# Patient Record
Sex: Female | Born: 1965 | Race: White | Hispanic: No | Marital: Married | Smoking: Never smoker
Health system: Southern US, Community
[De-identification: ages and names within clinical notes are randomized; demographics above are authoritative.]

## PROBLEM LIST (undated history)

## (undated) DIAGNOSIS — K759 Inflammatory liver disease, unspecified: Secondary | ICD-10-CM

## (undated) DIAGNOSIS — R7989 Other specified abnormal findings of blood chemistry: Secondary | ICD-10-CM

## (undated) DIAGNOSIS — R001 Bradycardia, unspecified: Secondary | ICD-10-CM

## (undated) DIAGNOSIS — I77810 Thoracic aortic ectasia: Secondary | ICD-10-CM

## (undated) DIAGNOSIS — R945 Abnormal results of liver function studies: Secondary | ICD-10-CM

## (undated) DIAGNOSIS — I77819 Aortic ectasia, unspecified site: Secondary | ICD-10-CM

## (undated) DIAGNOSIS — Z8249 Family history of ischemic heart disease and other diseases of the circulatory system: Secondary | ICD-10-CM

## (undated) DIAGNOSIS — E079 Disorder of thyroid, unspecified: Secondary | ICD-10-CM

## (undated) DIAGNOSIS — D649 Anemia, unspecified: Secondary | ICD-10-CM

## (undated) DIAGNOSIS — R51 Headache: Secondary | ICD-10-CM

## (undated) DIAGNOSIS — I341 Nonrheumatic mitral (valve) prolapse: Secondary | ICD-10-CM

## (undated) DIAGNOSIS — Z87442 Personal history of urinary calculi: Secondary | ICD-10-CM

## (undated) HISTORY — PX: LIVER BIOPSY: SHX301

## (undated) HISTORY — PX: DILATION AND CURETTAGE OF UTERUS: SHX78

## (undated) HISTORY — DX: Aortic ectasia, unspecified site: I77.819

## (undated) HISTORY — DX: Family history of ischemic heart disease and other diseases of the circulatory system: Z82.49

## (undated) HISTORY — DX: Disorder of thyroid, unspecified: E07.9

## (undated) HISTORY — DX: Nonrheumatic mitral (valve) prolapse: I34.1

## (undated) HISTORY — DX: Bradycardia, unspecified: R00.1

## (undated) HISTORY — DX: Thoracic aortic ectasia: I77.810

## (undated) HISTORY — PX: UTERINE FIBROID SURGERY: SHX826

---

## 1999-02-24 ENCOUNTER — Other Ambulatory Visit: Admission: RE | Admit: 1999-02-24 | Discharge: 1999-02-24 | Payer: Self-pay | Admitting: Obstetrics and Gynecology

## 2000-02-29 ENCOUNTER — Other Ambulatory Visit: Admission: RE | Admit: 2000-02-29 | Discharge: 2000-02-29 | Payer: Self-pay | Admitting: Obstetrics and Gynecology

## 2000-08-12 ENCOUNTER — Encounter: Payer: Self-pay | Admitting: Obstetrics and Gynecology

## 2000-08-12 ENCOUNTER — Inpatient Hospital Stay (HOSPITAL_COMMUNITY): Admission: AD | Admit: 2000-08-12 | Discharge: 2000-08-12 | Payer: Self-pay | Admitting: Obstetrics and Gynecology

## 2001-09-11 ENCOUNTER — Other Ambulatory Visit: Admission: RE | Admit: 2001-09-11 | Discharge: 2001-09-11 | Payer: Self-pay | Admitting: Obstetrics and Gynecology

## 2002-01-15 ENCOUNTER — Ambulatory Visit (HOSPITAL_COMMUNITY): Admission: RE | Admit: 2002-01-15 | Discharge: 2002-01-15 | Payer: Self-pay | Admitting: Obstetrics and Gynecology

## 2002-01-15 ENCOUNTER — Encounter: Payer: Self-pay | Admitting: Obstetrics and Gynecology

## 2002-02-27 ENCOUNTER — Inpatient Hospital Stay (HOSPITAL_COMMUNITY): Admission: AD | Admit: 2002-02-27 | Discharge: 2002-02-27 | Payer: Self-pay | Admitting: Obstetrics and Gynecology

## 2002-03-27 ENCOUNTER — Inpatient Hospital Stay (HOSPITAL_COMMUNITY): Admission: AD | Admit: 2002-03-27 | Discharge: 2002-03-28 | Payer: Self-pay | Admitting: Obstetrics and Gynecology

## 2002-05-14 ENCOUNTER — Other Ambulatory Visit: Admission: RE | Admit: 2002-05-14 | Discharge: 2002-05-14 | Payer: Self-pay | Admitting: Obstetrics and Gynecology

## 2003-06-24 ENCOUNTER — Other Ambulatory Visit: Admission: RE | Admit: 2003-06-24 | Discharge: 2003-06-24 | Payer: Self-pay | Admitting: Obstetrics and Gynecology

## 2004-02-10 ENCOUNTER — Other Ambulatory Visit: Admission: RE | Admit: 2004-02-10 | Discharge: 2004-02-10 | Payer: Self-pay | Admitting: Obstetrics and Gynecology

## 2004-08-03 ENCOUNTER — Inpatient Hospital Stay (HOSPITAL_COMMUNITY): Admission: AD | Admit: 2004-08-03 | Discharge: 2004-08-05 | Payer: Self-pay | Admitting: Obstetrics and Gynecology

## 2004-08-06 ENCOUNTER — Encounter: Admission: RE | Admit: 2004-08-06 | Discharge: 2004-09-05 | Payer: Self-pay | Admitting: Obstetrics and Gynecology

## 2004-09-27 ENCOUNTER — Other Ambulatory Visit: Admission: RE | Admit: 2004-09-27 | Discharge: 2004-09-27 | Payer: Self-pay | Admitting: Obstetrics and Gynecology

## 2004-10-19 ENCOUNTER — Encounter: Admission: RE | Admit: 2004-10-19 | Discharge: 2004-10-19 | Payer: Self-pay | Admitting: Gastroenterology

## 2007-06-06 ENCOUNTER — Encounter: Admission: RE | Admit: 2007-06-06 | Discharge: 2007-06-06 | Payer: Self-pay | Admitting: Obstetrics and Gynecology

## 2010-02-25 ENCOUNTER — Other Ambulatory Visit: Payer: Self-pay | Admitting: Obstetrics and Gynecology

## 2010-02-25 DIAGNOSIS — Z1231 Encounter for screening mammogram for malignant neoplasm of breast: Secondary | ICD-10-CM

## 2010-03-07 ENCOUNTER — Ambulatory Visit
Admission: RE | Admit: 2010-03-07 | Discharge: 2010-03-07 | Disposition: A | Discharge status: Home / Self Care | Payer: Self-pay | Source: Ambulatory Visit | Attending: Obstetrics and Gynecology | Admitting: Obstetrics and Gynecology

## 2010-03-07 DIAGNOSIS — Z1231 Encounter for screening mammogram for malignant neoplasm of breast: Secondary | ICD-10-CM

## 2010-05-05 ENCOUNTER — Ambulatory Visit (INDEPENDENT_AMBULATORY_CARE_PROVIDER_SITE_OTHER): Payer: BLUE CROSS/BLUE SHIELD

## 2010-05-05 DIAGNOSIS — I83893 Varicose veins of bilateral lower extremities with other complications: Secondary | ICD-10-CM

## 2012-08-06 ENCOUNTER — Other Ambulatory Visit: Payer: Self-pay | Admitting: Gastroenterology

## 2012-08-06 DIAGNOSIS — R945 Abnormal results of liver function studies: Secondary | ICD-10-CM

## 2012-08-06 DIAGNOSIS — R7989 Other specified abnormal findings of blood chemistry: Secondary | ICD-10-CM

## 2012-08-12 ENCOUNTER — Ambulatory Visit
Admission: RE | Admit: 2012-08-12 | Discharge: 2012-08-12 | Disposition: A | Discharge status: Home / Self Care | Payer: No Typology Code available for payment source | Source: Ambulatory Visit | Attending: Gastroenterology | Admitting: Gastroenterology

## 2012-08-12 DIAGNOSIS — R7989 Other specified abnormal findings of blood chemistry: Secondary | ICD-10-CM

## 2012-08-12 DIAGNOSIS — R945 Abnormal results of liver function studies: Secondary | ICD-10-CM

## 2012-09-09 ENCOUNTER — Encounter (HOSPITAL_COMMUNITY): Payer: Self-pay

## 2012-09-09 NOTE — H&P (Signed)
47 year old G 4 P 4 with menorrhagia. Ultrasound in office showed 3 cm submucosal fibroid.  Medical History PCOS Workup with GI ongoing for ?cirrhosis  Surgical History  None  NKDA  Afebrile VSS General alert and oriented Lung CTAB Car RRR Abdomen is soft and non tender Pelvic is WNL except for above  IMPRESSION Menorrhagia Submucosal Fibroid  PLAN: D and C Hysteroscopy Resection of Submucosal Fibroid Thermachoice Endometrial ablation  Risks reviewed Consent signed

## 2012-09-13 ENCOUNTER — Ambulatory Visit (HOSPITAL_COMMUNITY)
Admission: RE | Admit: 2012-09-13 | Discharge: 2012-09-13 | Disposition: A | Discharge status: Home / Self Care | Payer: No Typology Code available for payment source | Source: Ambulatory Visit | Attending: Obstetrics and Gynecology | Admitting: Obstetrics and Gynecology

## 2012-09-13 ENCOUNTER — Encounter (HOSPITAL_COMMUNITY)
Admission: RE | Disposition: A | Discharge status: Home / Self Care | Payer: Self-pay | Source: Ambulatory Visit | Attending: Obstetrics and Gynecology

## 2012-09-13 ENCOUNTER — Ambulatory Visit (HOSPITAL_COMMUNITY): Payer: No Typology Code available for payment source | Admitting: Anesthesiology

## 2012-09-13 ENCOUNTER — Encounter (HOSPITAL_COMMUNITY): Payer: Self-pay | Admitting: Anesthesiology

## 2012-09-13 DIAGNOSIS — D250 Submucous leiomyoma of uterus: Secondary | ICD-10-CM | POA: Insufficient documentation

## 2012-09-13 DIAGNOSIS — D219 Benign neoplasm of connective and other soft tissue, unspecified: Secondary | ICD-10-CM

## 2012-09-13 DIAGNOSIS — N920 Excessive and frequent menstruation with regular cycle: Secondary | ICD-10-CM | POA: Insufficient documentation

## 2012-09-13 HISTORY — DX: Anemia, unspecified: D64.9

## 2012-09-13 HISTORY — DX: Abnormal results of liver function studies: R94.5

## 2012-09-13 HISTORY — DX: Headache: R51

## 2012-09-13 HISTORY — PX: DILATATION & CURETTAGE/HYSTEROSCOPY WITH TRUECLEAR: SHX6353

## 2012-09-13 HISTORY — PX: DILATATION & CURRETTAGE/HYSTEROSCOPY WITH RESECTOCOPE: SHX5572

## 2012-09-13 HISTORY — DX: Other specified abnormal findings of blood chemistry: R79.89

## 2012-09-13 LAB — CBC
MCH: 28.6 pg (ref 26.0–34.0)
MCHC: 33.1 g/dL (ref 30.0–36.0)
MCV: 86.4 fL (ref 78.0–100.0)
Platelets: 196 10*3/uL (ref 150–400)
RBC: 3.6 MIL/uL — ABNORMAL LOW (ref 3.87–5.11)
RDW: 12.9 % (ref 11.5–15.5)

## 2012-09-13 SURGERY — DILATATION & CURETTAGE/HYSTEROSCOPY WITH TRUCLEAR
Anesthesia: General | Site: Vagina | Wound class: Clean Contaminated

## 2012-09-13 MED ORDER — MIDAZOLAM HCL 5 MG/5ML IJ SOLN
INTRAMUSCULAR | Status: DC | PRN
  Administered 2012-09-13: 2 mg via INTRAVENOUS

## 2012-09-13 MED ORDER — LIDOCAINE HCL (CARDIAC) 20 MG/ML IV SOLN
INTRAVENOUS | Status: AC
  Filled 2012-09-13: qty 5

## 2012-09-13 MED ORDER — FENTANYL CITRATE 0.05 MG/ML IJ SOLN
INTRAMUSCULAR | Status: AC
  Administered 2012-09-13: 25 ug via INTRAVENOUS
  Filled 2012-09-13: qty 2

## 2012-09-13 MED ORDER — ATROPINE SULFATE 0.4 MG/ML IJ SOLN
INTRAMUSCULAR | Status: AC
  Filled 2012-09-13: qty 1

## 2012-09-13 MED ORDER — LIDOCAINE HCL 1 % IJ SOLN
INTRAMUSCULAR | Status: DC | PRN
  Administered 2012-09-13: 10 mL

## 2012-09-13 MED ORDER — CEFAZOLIN SODIUM-DEXTROSE 2-3 GM-% IV SOLR
2.0000 g | INTRAVENOUS | Status: AC
  Administered 2012-09-13: 2 g via INTRAVENOUS

## 2012-09-13 MED ORDER — FENTANYL CITRATE 0.05 MG/ML IJ SOLN
INTRAMUSCULAR | Status: DC | PRN
  Administered 2012-09-13 (×4): 50 ug via INTRAVENOUS

## 2012-09-13 MED ORDER — LACTATED RINGERS IV SOLN: INTRAVENOUS | Status: DC

## 2012-09-13 MED ORDER — VASOPRESSIN 20 UNIT/ML IJ SOLN
INTRAMUSCULAR | Status: AC
  Filled 2012-09-13: qty 1

## 2012-09-13 MED ORDER — MIDAZOLAM HCL 2 MG/2ML IJ SOLN: 0.5000 mg | Freq: Once | INTRAMUSCULAR | Status: DC | PRN

## 2012-09-13 MED ORDER — CEFAZOLIN SODIUM-DEXTROSE 2-3 GM-% IV SOLR
INTRAVENOUS | Status: AC
  Filled 2012-09-13: qty 50

## 2012-09-13 MED ORDER — IBUPROFEN 200 MG PO TABS: 600.0000 mg | ORAL_TABLET | Freq: Four times a day (QID) | ORAL | Status: DC | PRN

## 2012-09-13 MED ORDER — FENTANYL CITRATE 0.05 MG/ML IJ SOLN: 25.0000 ug | INTRAMUSCULAR | Status: DC | PRN

## 2012-09-13 MED ORDER — FENTANYL CITRATE 0.05 MG/ML IJ SOLN
INTRAMUSCULAR | Status: AC
  Filled 2012-09-13: qty 2

## 2012-09-13 MED ORDER — VASOPRESSIN 20 UNIT/ML IJ SOLN
INTRAVENOUS | Status: DC | PRN
  Administered 2012-09-13: 14:00:00 via INTRAMUSCULAR

## 2012-09-13 MED ORDER — PROPOFOL 10 MG/ML IV EMUL
INTRAVENOUS | Status: AC
  Filled 2012-09-13: qty 20

## 2012-09-13 MED ORDER — ATROPINE SULFATE 0.4 MG/ML IJ SOLN
INTRAMUSCULAR | Status: DC | PRN
  Administered 2012-09-13: 0.2 mg via INTRAVENOUS

## 2012-09-13 MED ORDER — DEXAMETHASONE SODIUM PHOSPHATE 10 MG/ML IJ SOLN
INTRAMUSCULAR | Status: AC
  Filled 2012-09-13: qty 1

## 2012-09-13 MED ORDER — LACTATED RINGERS IV SOLN
INTRAVENOUS | Status: DC
  Administered 2012-09-13 (×2): via INTRAVENOUS

## 2012-09-13 MED ORDER — LIDOCAINE HCL (CARDIAC) 20 MG/ML IV SOLN
INTRAVENOUS | Status: DC | PRN
  Administered 2012-09-13: 60 mg via INTRAVENOUS

## 2012-09-13 MED ORDER — PROPOFOL 10 MG/ML IV BOLUS
INTRAVENOUS | Status: DC | PRN
  Administered 2012-09-13: 150 mg via INTRAVENOUS
  Administered 2012-09-13: 20 mg via INTRAVENOUS

## 2012-09-13 MED ORDER — ONDANSETRON HCL 4 MG/2ML IJ SOLN
INTRAMUSCULAR | Status: DC | PRN
  Administered 2012-09-13: 4 mg via INTRAVENOUS

## 2012-09-13 MED ORDER — DEXTROSE 5 % IV SOLN
INTRAVENOUS | Status: DC | PRN
  Administered 2012-09-13: 1000 mL

## 2012-09-13 MED ORDER — OXYCODONE-ACETAMINOPHEN 10-325 MG PO TABS: 1.0000 | ORAL_TABLET | ORAL | Status: DC | PRN

## 2012-09-13 MED ORDER — GLYCINE 1.5 % IR SOLN
Status: DC | PRN
  Administered 2012-09-13 (×2): 3000 mL

## 2012-09-13 MED ORDER — KETOROLAC TROMETHAMINE 30 MG/ML IJ SOLN: 15.0000 mg | Freq: Once | INTRAMUSCULAR | Status: DC | PRN

## 2012-09-13 MED ORDER — PROMETHAZINE HCL 25 MG/ML IJ SOLN: 6.2500 mg | INTRAMUSCULAR | Status: DC | PRN

## 2012-09-13 MED ORDER — SODIUM CHLORIDE 0.9 % IR SOLN
Status: DC | PRN
  Administered 2012-09-13 (×4): 3000 mL

## 2012-09-13 MED ORDER — DEXAMETHASONE SODIUM PHOSPHATE 10 MG/ML IJ SOLN
INTRAMUSCULAR | Status: DC | PRN
  Administered 2012-09-13: 10 mg via INTRAVENOUS

## 2012-09-13 MED ORDER — KETOROLAC TROMETHAMINE 30 MG/ML IJ SOLN
INTRAMUSCULAR | Status: DC | PRN
  Administered 2012-09-13: 30 mg via INTRAVENOUS

## 2012-09-13 MED ORDER — MEPERIDINE HCL 25 MG/ML IJ SOLN: 6.2500 mg | INTRAMUSCULAR | Status: DC | PRN

## 2012-09-13 MED ORDER — MIDAZOLAM HCL 2 MG/2ML IJ SOLN
INTRAMUSCULAR | Status: AC
  Filled 2012-09-13: qty 2

## 2012-09-13 MED ORDER — ONDANSETRON HCL 4 MG/2ML IJ SOLN
INTRAMUSCULAR | Status: AC
  Filled 2012-09-13: qty 2

## 2012-09-13 MED ORDER — KETOROLAC TROMETHAMINE 30 MG/ML IJ SOLN
INTRAMUSCULAR | Status: AC
  Filled 2012-09-13: qty 1

## 2012-09-13 SURGICAL SUPPLY — 17 items
CANISTER SUCTION 2500CC (MISCELLANEOUS) ×3 IMPLANT
CANISTERS HI-FLOW 3000CC (CANNISTER) ×4 IMPLANT
CATH ROBINSON RED A/P 16FR (CATHETERS) ×2 IMPLANT
CATH THERMACHOICE III (CATHETERS) ×3 IMPLANT
CLOTH BEACON ORANGE TIMEOUT ST (SAFETY) ×2 IMPLANT
CONTAINER PREFILL 10% NBF 60ML (FORM) ×3 IMPLANT
DRESSING TELFA 8X3 (GAUZE/BANDAGES/DRESSINGS) ×2 IMPLANT
GLOVE BIO SURGEON STRL SZ 6.5 (GLOVE) ×4 IMPLANT
GOWN STRL REIN XL XLG (GOWN DISPOSABLE) ×4 IMPLANT
KIT HYSTEROSCOPY TRUCLEAR (ABLATOR) ×1 IMPLANT
MORCELLATOR RECIP TRUCLEAR 4.0 (ABLATOR) ×2 IMPLANT
PACK HYSTEROSCOPY LF (CUSTOM PROCEDURE TRAY) ×1 IMPLANT
PACK VAGINAL MINOR WOMEN LF (CUSTOM PROCEDURE TRAY) ×1 IMPLANT
PAD OB MATERNITY 4.3X12.25 (PERSONAL CARE ITEMS) ×2 IMPLANT
TOWEL OR 17X24 6PK STRL BLUE (TOWEL DISPOSABLE) ×4 IMPLANT
TUBING HYDROFLEX HYSTEROSCOPY (TUBING) ×1 IMPLANT
WATER STERILE IRR 1000ML POUR (IV SOLUTION) ×2 IMPLANT

## 2012-09-13 NOTE — Progress Notes (Signed)
H and P on the chart. No significant changes Will procced with D and C, Hysteroscopy, resection of fibroid and Thermachoice Consent signed

## 2012-09-13 NOTE — Brief Op Note (Signed)
09/13/2012  2:55 PM  PATIENT:  Barbara Knight  47 y.o. female  PRE-OPERATIVE DIAGNOSIS:  Menorrhagia, submucosal fibroid  POST-OPERATIVE DIAGNOSIS:  Menorrhagia, same  PROCEDURE:  Procedure(s): DILATATION & CURETTAGE/HYSTEROSCOPY WITH TRUECLEAR and THERMACHOICE (N/A) DILATATION & CURETTAGE/HYSTEROSCOPY WITH RESECTOCOPE (N/A)  SURGEON:  Surgeon(s) and Role:    * Jeani Hawking, MD - Primary  PHYSICIAN ASSISTANT:   ASSISTANTS: none   ANESTHESIA:   paracervical block and MAC  EBL:  Total I/O In: 1500 [I.V.:1500] Out: 50 [Blood:50]  BLOOD ADMINISTERED:none  DRAINS: none   LOCAL MEDICATIONS USED:  XYLOCAINE   SPECIMEN:  Source of Specimen:  fibroid fragments  DISPOSITION OF SPECIMEN:  PATHOLOGY  COUNTS:  YES  TOURNIQUET:  * No tourniquets in log *  DICTATION: .Other Dictation: Dictation Number [id number]  PLAN OF CARE: Discharge to home after PACU  PATIENT DISPOSITION:  PACU - hemodynamically stable.   Delay start of Pharmacological VTE agent (>24hrs) due to surgical blood loss or risk of bleeding: not applicable

## 2012-09-13 NOTE — Anesthesia Preprocedure Evaluation (Signed)
Anesthesia Evaluation  Patient identified by MRN, date of birth, ID band Patient awake    Reviewed: Allergy & Precautions, H&P , Patient's Chart, lab work & pertinent test results, reviewed documented beta blocker date and time   History of Anesthesia Complications Negative for: history of anesthetic complications  Airway Mallampati: II TM Distance: >3 FB Neck ROM: full    Dental no notable dental hx.    Pulmonary neg pulmonary ROS,  breath sounds clear to auscultation  Pulmonary exam normal       Cardiovascular Exercise Tolerance: Good negative cardio ROS  Rhythm:regular Rate:Normal     Neuro/Psych negative neurological ROS  negative psych ROS   GI/Hepatic negative GI ROS, Neg liver ROS,   Endo/Other  negative endocrine ROS  Renal/GU negative Renal ROS     Musculoskeletal   Abdominal   Peds  Hematology negative hematology ROS (+) anemia ,   Anesthesia Other Findings Headache(784.0)   migraines Anemia        Elevated LFTs   hepatitis virus was negative             Reproductive/Obstetrics negative OB ROS                           Anesthesia Physical Anesthesia Plan  ASA: II  Anesthesia Plan: General LMA   Post-op Pain Management:    Induction:   Airway Management Planned:   Additional Equipment:   Intra-op Plan:   Post-operative Plan:   Informed Consent: I have reviewed the patients History and Physical, chart, labs and discussed the procedure including the risks, benefits and alternatives for the proposed anesthesia with the patient or authorized representative who has indicated his/her understanding and acceptance.   Dental Advisory Given  Plan Discussed with: CRNA, Surgeon and Anesthesiologist  Anesthesia Plan Comments:         Anesthesia Quick Evaluation

## 2012-09-13 NOTE — Transfer of Care (Signed)
Immediate Anesthesia Transfer of Care Note  Patient: Barbara Knight  Procedure(s) Performed: Procedure(s): DILATATION & CURETTAGE/HYSTEROSCOPY WITH TRUECLEAR and THERMACHOICE (N/A) DILATATION & CURETTAGE/HYSTEROSCOPY WITH RESECTOCOPE (N/A)  Patient Location: PACU  Anesthesia Type:General  Level of Consciousness: sedated  Airway & Oxygen Therapy: Patient Spontanous Breathing and Patient connected to nasal cannula oxygen  Post-op Assessment: Report given to PACU RN and Post -op Vital signs reviewed and stable  Post vital signs: stable  Complications: No apparent anesthesia complications

## 2012-09-13 NOTE — Anesthesia Postprocedure Evaluation (Signed)
  Anesthesia Post Note  Patient: Barbara Knight  Procedure(s) Performed: Procedure(s) (LRB): DILATATION & CURETTAGE/HYSTEROSCOPY WITH TRUECLEAR and THERMACHOICE (N/A) DILATATION & CURETTAGE/HYSTEROSCOPY WITH RESECTOCOPE (N/A)  Anesthesia type: GA  Patient location: PACU  Post pain: Pain level controlled  Post assessment: Post-op Vital signs reviewed  Last Vitals:  Filed Vitals:   09/13/12 1515  BP: 111/77  Pulse: 70  Temp:   Resp: 16    Post vital signs: Reviewed  Level of consciousness: sedated  Complications: No apparent anesthesia complications

## 2012-09-14 NOTE — Op Note (Signed)
NAMEDESHONNA, TRNKA                  ACCOUNT NO.:  [id number]  MEDICAL RECORD NO.:  [id number]  LOCATION:  [facility]                          FACILITY:  [facility]  PHYSICIAN:  Rayaan Lorah L. Ayeden Gladman, M.D.DATE OF BIRTH:  1965/06/22  DATE OF PROCEDURE:  09/13/2012 DATE OF DISCHARGE:  09/13/2012                              OPERATIVE REPORT   PREOPERATIVE DIAGNOSIS:  Menorrhagia and submucosal fibroid.  POSTOPERATIVE DIAGNOSIS:  Menorrhagia and submucosal fibroid.  PROCEDURE:  Dilation and curettage, hysteroscopy with Truclear, partial resection of uterine fibroids, hysteroscopy with resectoscope and ThermaChoice endometrial ablation.  SURGEON:  Enos Muhl L. Stevenson Windmiller, M.D.  FLUID DEFICIT:  About a total of 600-700 mL.  COMPLICATIONS:  None.  EBL:  Less than 100 mL.  PROCEDURE IN DETAIL:  The patient was taken to the operating room.  She was prepped and draped.  In and out catheter was used to empty the bladder.  Speculum was inserted into the vagina.  The cervix was grasped with a tenaculum, and vasopressin was injected at 5 and 7 o'clock. Paracervical block was performed as well.  The cervical and internal os was gently dilated using Pratt dilators.  The obturator to the Truclear was inserted, and with the insertion of the hysteroscope, we could see a very large fibroid that completely obliterated the very superior portion of the cervical canal going into the endometrial cavity.  With excellent visualization, we could see that the fibroid encompassed from wall to wall, anterior to posterior, and from right to left.  It appeared that the implantation site was on the left side of the uterus.  Using the Truclear device, we tried to seat the Truclear on the fibroid and were able to make a small incision in the most inferior portion of the fibroid.  However, the fibroid was extremely calcified, and it did not pull with the Truclear like it was supposed to.  We then decided to use the resectoscope.   Using the resectoscope, I inserted the resectoscope and was able to make a decent wedge-shaped incision in the fibroid.  I was able to decompress approximately 75% of the fibroid.  However, the area of the implantation site on the patient's left side, it appeared that this was a broad-based fibroid with very vascular supply.  While I tried to target the vessels that were overlying the fibroid, I was not able to get close to the broad base of the fibroid because it was so large and because it encompassed so much of the cavity and because of greater risk of uterine perforation.  For these reasons, I was not able to completely resect the fibroid.  However after we reached about 600- 700 mL fluid deficit, I decided to proceed with ThermaChoice endometrial ablation in the hopes that the endometrial ablation would hopefully burn enough of the fibroid so that it would necrose.  The hysteroscope was removed.  The ThermaChoice balloon was inserted according to the manufacturer's specifications, and with approximately 15 mL of fluid, a very good ThermaChoice cycle was performed with an 8 minutes cycle.  The pressure maintained around 150.  At the end of the procedure, the intact balloon was removed.  I  then inserted the curette and was able to curette large fragments of the fibroid at this point, and all tissue was sent to pathology.  At the end of the procedure, I think I resected probably 60%-75% of the fibroid.  However, my concern is that because of the vascular nature of the fibroid, that it may potentially grow in the future.  She will need to have a followup ultrasound in about 2 weeks.  All sponge, lap, and instrument counts were correct x2.  The patient went to recovery room in stable condition.  I will discuss the postoperative findings with her husband.  She will come back to see me in 2 weeks.     Macario Shear L. Vincente Poli, M.D.     Florestine Avers  D:  09/13/2012  T:  09/13/2012  Job:   [id number]

## 2012-09-16 ENCOUNTER — Encounter (HOSPITAL_COMMUNITY): Payer: Self-pay | Admitting: Obstetrics and Gynecology

## 2012-10-15 ENCOUNTER — Other Ambulatory Visit (HOSPITAL_COMMUNITY): Payer: Self-pay | Admitting: Internal Medicine

## 2012-10-15 DIAGNOSIS — K759 Inflammatory liver disease, unspecified: Secondary | ICD-10-CM

## 2012-10-15 DIAGNOSIS — R7989 Other specified abnormal findings of blood chemistry: Secondary | ICD-10-CM

## 2012-10-15 DIAGNOSIS — R945 Abnormal results of liver function studies: Secondary | ICD-10-CM

## 2012-10-15 DIAGNOSIS — R935 Abnormal findings on diagnostic imaging of other abdominal regions, including retroperitoneum: Secondary | ICD-10-CM

## 2012-10-17 ENCOUNTER — Ambulatory Visit (HOSPITAL_COMMUNITY): Payer: No Typology Code available for payment source

## 2012-10-18 ENCOUNTER — Other Ambulatory Visit: Payer: Self-pay | Admitting: Radiology

## 2012-10-21 ENCOUNTER — Ambulatory Visit (HOSPITAL_COMMUNITY)
Admission: RE | Admit: 2012-10-21 | Discharge: 2012-10-21 | Disposition: A | Discharge status: Home / Self Care | Payer: No Typology Code available for payment source | Source: Ambulatory Visit | Attending: Internal Medicine | Admitting: Internal Medicine

## 2012-10-21 ENCOUNTER — Encounter (HOSPITAL_COMMUNITY): Payer: Self-pay

## 2012-10-21 DIAGNOSIS — G43909 Migraine, unspecified, not intractable, without status migrainosus: Secondary | ICD-10-CM | POA: Insufficient documentation

## 2012-10-21 DIAGNOSIS — Z79899 Other long term (current) drug therapy: Secondary | ICD-10-CM | POA: Insufficient documentation

## 2012-10-21 DIAGNOSIS — R7989 Other specified abnormal findings of blood chemistry: Secondary | ICD-10-CM | POA: Insufficient documentation

## 2012-10-21 DIAGNOSIS — K759 Inflammatory liver disease, unspecified: Secondary | ICD-10-CM

## 2012-10-21 DIAGNOSIS — R935 Abnormal findings on diagnostic imaging of other abdominal regions, including retroperitoneum: Secondary | ICD-10-CM

## 2012-10-21 DIAGNOSIS — R945 Abnormal results of liver function studies: Secondary | ICD-10-CM

## 2012-10-21 MED ORDER — SODIUM CHLORIDE 0.9 % IV SOLN: INTRAVENOUS | Status: DC

## 2012-10-21 MED ORDER — FENTANYL CITRATE 0.05 MG/ML IJ SOLN
INTRAMUSCULAR | Status: AC
  Filled 2012-10-21: qty 4

## 2012-10-21 MED ORDER — FENTANYL CITRATE 0.05 MG/ML IJ SOLN
INTRAMUSCULAR | Status: DC | PRN
  Administered 2012-10-21: 50 ug via INTRAVENOUS

## 2012-10-21 MED ORDER — MIDAZOLAM HCL 2 MG/2ML IJ SOLN
INTRAMUSCULAR | Status: AC
  Filled 2012-10-21: qty 4

## 2012-10-21 MED ORDER — HYDROCODONE-ACETAMINOPHEN 5-325 MG PO TABS: 1.0000 | ORAL_TABLET | ORAL | Status: DC | PRN

## 2012-10-21 MED ORDER — MIDAZOLAM HCL 2 MG/2ML IJ SOLN
INTRAMUSCULAR | Status: DC | PRN
  Administered 2012-10-21: 1 mg via INTRAVENOUS

## 2012-10-21 NOTE — Procedures (Signed)
Procedure:  Ultrasound guided core biopsy of the liver Findings:  18 G core biopsy x 2 via 17 G needle in right lobe of liver.  Solid core samples obtained. No immediate complications.

## 2012-10-21 NOTE — H&P (Signed)
Agree 

## 2012-10-21 NOTE — H&P (Signed)
Chief Complaint: "I am here for a liver biopsy." Referring Physician: Dr. Dola Argyle HPI: Barbara Knight is an 47 y.o. female who has a history of intermittent elevation of ALT and AST. The patient has had a liver ultrasound in 2006 with findings suggestive of cirrhosis, but no diagnostic. She has had a recent liver ultrasound 07/2012 with similar findings. The patient was seen by Dr. Dola Argyle and he has requested a image guided liver biopsy. She admits to past history of heavy vaginal bleeding with fibroid history. She denies any active bleeding, blood in her stool or urine. She denies taking any blood thinners at this time. She has had recent CBC and outside labwork done WNL and requests at this time to not have labs drawn. She denies any recent illness, fever or chills. She denies chest pain or shortness of breath. She denies any changes to her bowel movements.   Past Medical History:  Past Medical History  Diagnosis Date  . Headache(784.0)     migraines  . Anemia   . Elevated LFTs     hepatitis virus was negative   Past Surgical History:  Past Surgical History  Procedure Laterality Date  . Dilation and curettage of uterus    . Dilatation & curettage/hysteroscopy with trueclear N/A 09/13/2012    Procedure: DILATATION & CURETTAGE/HYSTEROSCOPY WITH TRUECLEAR and THERMACHOICE;  Surgeon: Jeani Hawking, MD;  Location: [facility];  Service: Gynecology;  Laterality: N/A;  . Dilatation & currettage/hysteroscopy with resectocope N/A 09/13/2012    Procedure: DILATATION & CURETTAGE/HYSTEROSCOPY WITH RESECTOCOPE;  Surgeon: Jeani Hawking, MD;  Location: [facility];  Service: Gynecology;  Laterality: N/A;   Family History: No family history on file.  Social History:  reports that she has never smoked. She does not have any smokeless tobacco history on file. She reports that  drinks alcohol. She reports that she does not use illicit drugs.  Allergies: No Known Allergies    Medication List    ASK your  doctor about these medications       ALAWAY 0.025 % ophthalmic solution  Generic drug:  ketotifen  Place 1 drop into both eyes daily.     ibuprofen 200 MG tablet  Commonly known as:  ADVIL  Take 3 tablets (600 mg total) by mouth every 6 (six) hours as needed for pain.     Multi-Vitamin/Iron Tabs  Take 1 tablet by mouth daily.     oxyCODONE-acetaminophen 10-325 MG per tablet  Commonly known as:  PERCOCET  Take 1 tablet by mouth every 4 (four) hours as needed for pain.     tretinoin 0.1 % cream  Commonly known as:  RETIN-A  Apply 1 application topically at bedtime.       Please HPI for pertinent positives, otherwise complete 10 system ROS negative.  Physical Exam: BP 123/73  Pulse 61  Temp(Src) 98.2 F (36.8 C) (Oral)  Resp 20  Ht 5\' 7"  (1.702 m)  Wt 154 lb (69.854 kg)  BMI 24.11 kg/m2  SpO2 100% Body mass index is 24.11 kg/(m^2).   General Appearance:  Alert, cooperative, no distress.  Head:  Normocephalic, without obvious abnormality, atraumatic  Neck: Supple, symmetrical, trachea midline  Lungs:   Clear to auscultation bilaterally, no w/r/r, respirations unlabored without use of accessory muscles.  Chest Wall:  No tenderness or deformity  Heart:  Regular rate and rhythm, S1, S2 normal, no murmur, rub or gallop.  Abdomen:   Soft, non-tender, non distended.  Extremities: Extremities normal, atraumatic, no  cyanosis or edema  Pulses: 2+ and symmetric  Neurologic: Normal affect, no gross deficits.   Recent Results (from the past 2160 hour(s))  CBC     Status: Abnormal   Collection Time    09/13/12 11:56 AM      Result Value Range   WBC 6.7  4.0 - 10.5 K/uL   RBC 3.60 (*) 3.87 - 5.11 MIL/uL   Hemoglobin 10.3 (*) 12.0 - 15.0 g/dL   HCT 16.1 (*) 09.6 - 04.5 %   MCV 86.4  78.0 - 100.0 fL   MCH 28.6  26.0 - 34.0 pg   MCHC 33.1  30.0 - 36.0 g/dL   RDW 40.9  81.1 - 91.4 %   Platelets 196  150 - 400 K/uL   Assessment/Plan Abnormal liver ultrasound. History of  elevated LFT's Request for image guided liver biopsy. Patient has been NPO, recent labs have been reviewed with Dr. Fredia Sorrow. Risks and Benefits discussed with the patient. All of the patient's questions were answered, patient is agreeable to proceed. Consent signed and in chart.   [name] D PA-C 10/21/2012, 1:50 PM

## 2012-11-20 ENCOUNTER — Other Ambulatory Visit: Payer: Self-pay

## 2012-11-20 DIAGNOSIS — Z1231 Encounter for screening mammogram for malignant neoplasm of breast: Secondary | ICD-10-CM

## 2012-12-23 ENCOUNTER — Ambulatory Visit
Admission: RE | Admit: 2012-12-23 | Discharge: 2012-12-23 | Disposition: A | Discharge status: Home / Self Care | Payer: No Typology Code available for payment source | Source: Ambulatory Visit

## 2012-12-23 DIAGNOSIS — Z1231 Encounter for screening mammogram for malignant neoplasm of breast: Secondary | ICD-10-CM

## 2013-02-04 ENCOUNTER — Other Ambulatory Visit: Payer: Self-pay | Admitting: Obstetrics and Gynecology

## 2013-04-16 ENCOUNTER — Encounter (INDEPENDENT_AMBULATORY_CARE_PROVIDER_SITE_OTHER): Payer: Self-pay | Admitting: General Surgery

## 2013-04-16 ENCOUNTER — Telehealth (INDEPENDENT_AMBULATORY_CARE_PROVIDER_SITE_OTHER): Payer: Self-pay | Admitting: *Deleted

## 2013-04-16 NOTE — Telephone Encounter (Signed)
Called patient to insure that she was aware of the appt tomorrow.  Also called to give her Dr. Dois Davenport recommendations of sitz bathes TID, daily BM (use of stool softner and miralax if needed), drink 6-8 glasses of water per day, and high fiber diet.  Patient states understanding of recommendations.

## 2013-04-17 ENCOUNTER — Encounter (INDEPENDENT_AMBULATORY_CARE_PROVIDER_SITE_OTHER): Payer: Self-pay | Admitting: General Surgery

## 2013-04-17 ENCOUNTER — Ambulatory Visit (INDEPENDENT_AMBULATORY_CARE_PROVIDER_SITE_OTHER): Payer: PRIVATE HEALTH INSURANCE | Admitting: General Surgery

## 2013-04-17 VITALS — BP 126/76 | HR 76 | Temp 97.7°F | Resp 16 | Ht 67.0 in | Wt 160.0 lb

## 2013-04-17 DIAGNOSIS — K645 Perianal venous thrombosis: Secondary | ICD-10-CM

## 2013-04-17 NOTE — Patient Instructions (Signed)
Stop colace Continue sitz bathes at least twice a day Consider moistened tea bag to hemorrhoid to help with swelling Can use prep H Use wet wipes - avoid harsh wiping Call if symptoms persist or if you desire surgery  We discussed the etiology of hemorrhoids. The patient was given educational material as well as diagrams. We discussed nonoperative and operative management of hemorrhoidal disease.  We discussed the importance of having a daily soft bowel movement and avoiding constipation. We also discussed good bowel habits such as not reading in the bathroom, not straining, and drinking 6-8 glasses of water per day. We also discussed the importance of a high fiber diet. We discussed foods that were high in fiber as well as fiber supplements. We discussed the importance of trying to get 25-30 g of fiber per day in their diet. We discussed the need to start with a low dose of fiber and then gradually increasing their daily fiber dose over several weeks in order to avoid bloating and cramping.  We then discussed excisional hemorrhoidectomy.  Hemorrhoids Hemorrhoids are swollen veins around the rectum or anus. There are two types of hemorrhoids:   Internal hemorrhoids. These occur in the veins just inside the rectum. They may poke through to the outside and become irritated and painful.  External hemorrhoids. These occur in the veins outside the anus and can be felt as a painful swelling or hard lump near the anus. CAUSES  Pregnancy.   Obesity.   Constipation or diarrhea.   Straining to have a bowel movement.   Sitting for long periods on the toilet.  Heavy lifting or other activity that caused you to strain.  Anal intercourse. SYMPTOMS   Pain.   Anal itching or irritation.   Rectal bleeding.   Fecal leakage.   Anal swelling.   One or more lumps around the anus.  DIAGNOSIS  Your caregiver may be able to diagnose hemorrhoids by visual examination. Other  examinations or tests that may be performed include:   Examination of the rectal area with a gloved hand (digital rectal exam).   Examination of anal canal using a small tube (scope).   A blood test if you have lost a significant amount of blood.  A test to look inside the colon (sigmoidoscopy or colonoscopy). TREATMENT Most hemorrhoids can be treated at home. However, if symptoms do not seem to be getting better or if you have a lot of rectal bleeding, your caregiver may perform a procedure to help make the hemorrhoids get smaller or remove them completely. Possible treatments include:   Placing a rubber band at the base of the hemorrhoid to cut off the circulation (rubber band ligation).   Injecting a chemical to shrink the hemorrhoid (sclerotherapy).   Using a tool to burn the hemorrhoid (infrared light therapy).   Surgically removing the hemorrhoid (hemorrhoidectomy).   Stapling the hemorrhoid to block blood flow to the tissue (hemorrhoid stapling).  HOME CARE INSTRUCTIONS   Eat foods with fiber, such as whole grains, beans, nuts, fruits, and vegetables. Ask your doctor about taking products with added fiber in them (fibersupplements).  Increase fluid intake. Drink enough water and fluids to keep your urine clear or pale yellow.   Exercise regularly.   Go to the bathroom when you have the urge to have a bowel movement. Do not wait.   Avoid straining to have bowel movements.   Keep the anal area dry and clean. Use wet toilet paper or moist towelettes after  a bowel movement.   Medicated creams and suppositories may be used or applied as directed.   Only take over-the-counter or prescription medicines as directed by your caregiver.   Take warm sitz baths for 15 20 minutes, 3 4 times a day to ease pain and discomfort.   Place ice packs on the hemorrhoids if they are tender and swollen. Using ice packs between sitz baths may be helpful.   Put ice in a  plastic bag.   Place a towel between your skin and the bag.   Leave the ice on for 15 20 minutes, 3 4 times a day.   Do not use a donut-shaped pillow or sit on the toilet for long periods. This increases blood pooling and pain.  SEEK MEDICAL CARE IF:  You have increasing pain and swelling that is not controlled by treatment or medicine.  You have uncontrolled bleeding.  You have difficulty or you are unable to have a bowel movement.  You have pain or inflammation outside the area of the hemorrhoids. MAKE SURE YOU:  Understand these instructions.  Will watch your condition.  Will get help right away if you are not doing well or get worse. Document Released: 01/07/2000 Document Revised: 12/27/2011 Document Reviewed: 11/14/2011 [facility] Patient Information 2014 [employer].

## 2013-04-18 NOTE — Progress Notes (Signed)
Subjective:     Patient ID: Barbara Knight, female   DOB: July 12, 1965, 48 y.o.   MRN: [id number]  HPI 48 yo Wf referred by Dr [name] For evaluation of hemorrhoids. The patient has been treated and is being treated for autoimmune hepatitis. She has been on prednisone and azathioprine. The azathioprine has been causing her diarrhea. Normally she has regular bowel movements. She drinks 4-5 glasses of water a day. She sits on the commode for less than 10 minutes at a time. She does not straining. She denies any fecal or urinary incontinence. She denies any burning or tearing pain when having a bowel movement. She does not feel like she's been cut in half whenhaving A bowel movement. There has been some blood in the commode recently. She started having problems about a week ago. We increased her dose of is that her friend 2 weeks ago. As a result she started having more loose stools. On Saturday she noticed that she had swelling around her anus. She thought it was an enlarged hemorrhoid. Since Saturday and Sunday the bleeding has significantly improved and almost stopped. The discomfort is also significantly improved. She started taking Colace and Citrucel pills. She has also been using wet wipes. She is also doing some sitz baths. She had a flexible sigmoidoscopy 10 years ago for presumed hemorrhoids PMHx, PSHx, SOCHx, FAMHx, ALL reviewed   Review of Systems 10 point review of systems was performed and all systems are negative except for what is mentioned in the history of present illness.    Objective:   Physical Exam  Vitals reviewed. Constitutional: She is oriented to person, place, and time. She appears well-developed and well-nourished. No distress.  HENT:  Head: Normocephalic and atraumatic.  Right Ear: External ear normal.  Left Ear: External ear normal.  Eyes: No scleral icterus.  Cardiovascular: Normal rate, regular rhythm and normal heart sounds.   Pulmonary/Chest: Effort normal. No  respiratory distress. She has no wheezes.  Abdominal: Soft. She exhibits no distension.  Genitourinary: Rectal exam shows external hemorrhoid. Rectal exam shows no fissure.     Visual inspection only - no obvious anal fissure; enlarged thrombosed L sided hemorrhoid. - already open, appears to be resolving. No cellulitis, fluctuance. DRE/anoscopy deferred  Neurological: She is alert and oriented to person, place, and time.  Skin: Skin is warm and dry. No rash noted. She is not diaphoretic. No erythema.  Psychiatric: She has a normal mood and affect. Her behavior is normal. Judgment and thought content normal.       Assessment:     Resolving left-sided thrombosed external hemorrhoid     Plan:     We discussed the etiology of hemorrhoids. The patient was given educational material as well as diagrams. We discussed nonoperative and operative management of hemorrhoidal disease.  We discussed the importance of having a daily soft bowel movement and avoiding constipation. We also discussed good bowel habits such as not reading in the bathroom, not straining, and drinking 6-8 glasses of water per day. We also discussed the importance of a high fiber diet. We discussed foods that were high in fiber as well as fiber supplements. We discussed the importance of trying to get 25-30 g of fiber per day in their diet. We discussed the need to start with a low dose of fiber and then gradually increasing their daily fiber dose over several weeks in order to avoid bloating and cramping.  With respect to her current thrombosed external hemorrhoid  it has been present since last weekend in the acute pain has resolved. She reports minimal discomfort at this time. Therefore I believe she will not get much benefit from incising in evacuating the resolving thrombosed hemorrhoid. Therefore recommended continued sitz baths, hemorrhoidal cream, and stool softeners. She was advised to continue with fiber supplementation  preferably fiber powder.  We did discuss definitive excisional hemorrhoidectomy as an option on down the road. We discussed the risk and benefits of the procedure including bleeding, infection, recurrence, scarring, urinary retention, anesthesia issues as well as the typical postoperative course. For the most part her bowel habits are good. However because she is taking medication that tends to give her diarrhea I recommended ongoing fiber supplementation chronically. We rediscussed the importance of starting a low dose of fiber and gradually increasing in order to prevent recurrence.  She will followup as needed. She was instructed to contact the office if she would like to proceed with excisional hemorrhoidectomy in the future  Prisca Gearing M. Redmond Pulling, MD, FACS General, Bariatric, & Minimally Invasive Surgery [facility], [place]

## 2013-04-19 ENCOUNTER — Encounter (INDEPENDENT_AMBULATORY_CARE_PROVIDER_SITE_OTHER): Payer: Self-pay | Admitting: General Surgery

## 2013-04-22 ENCOUNTER — Telehealth (INDEPENDENT_AMBULATORY_CARE_PROVIDER_SITE_OTHER): Payer: Self-pay | Admitting: General Surgery

## 2013-04-22 NOTE — Telephone Encounter (Signed)
Called to check on pt. She said things are going well and hemorrhoid smaller. Asked pt to call office if she wanted to get hemorrhoid excised in future. F/u prn

## 2013-05-20 ENCOUNTER — Other Ambulatory Visit (HOSPITAL_COMMUNITY): Payer: Self-pay | Admitting: Internal Medicine

## 2013-05-20 DIAGNOSIS — R897 Abnormal histological findings in specimens from other organs, systems and tissues: Secondary | ICD-10-CM

## 2013-05-22 ENCOUNTER — Other Ambulatory Visit: Payer: Self-pay | Admitting: Internal Medicine

## 2013-05-22 ENCOUNTER — Inpatient Hospital Stay
Admission: RE | Admit: 2013-05-22 | Discharge: 2013-05-22 | Disposition: A | Discharge status: Home / Self Care | Payer: Self-pay | Source: Ambulatory Visit | Attending: Internal Medicine | Admitting: Internal Medicine

## 2013-05-22 ENCOUNTER — Other Ambulatory Visit (HOSPITAL_COMMUNITY): Payer: Self-pay | Admitting: Internal Medicine

## 2013-05-22 ENCOUNTER — Ambulatory Visit (HOSPITAL_COMMUNITY)
Admission: RE | Admit: 2013-05-22 | Discharge: 2013-05-22 | Disposition: A | Discharge status: Home / Self Care | Payer: No Typology Code available for payment source | Source: Ambulatory Visit | Attending: Internal Medicine | Admitting: Internal Medicine

## 2013-05-22 DIAGNOSIS — R52 Pain, unspecified: Secondary | ICD-10-CM

## 2013-05-22 DIAGNOSIS — R897 Abnormal histological findings in specimens from other organs, systems and tissues: Secondary | ICD-10-CM

## 2013-05-22 DIAGNOSIS — R7989 Other specified abnormal findings of blood chemistry: Secondary | ICD-10-CM | POA: Insufficient documentation

## 2013-05-22 DIAGNOSIS — K7460 Unspecified cirrhosis of liver: Secondary | ICD-10-CM | POA: Insufficient documentation

## 2013-05-22 MED ORDER — GADOBENATE DIMEGLUMINE 529 MG/ML IV SOLN
15.0000 mL | Freq: Once | INTRAVENOUS | Status: AC | PRN
  Administered 2013-05-22: 15 mL via INTRAVENOUS

## 2013-07-17 ENCOUNTER — Other Ambulatory Visit: Payer: Self-pay | Admitting: Dermatology

## 2013-10-30 ENCOUNTER — Other Ambulatory Visit: Payer: Self-pay | Admitting: Nurse Practitioner

## 2013-10-31 ENCOUNTER — Other Ambulatory Visit: Payer: Self-pay | Admitting: Nurse Practitioner

## 2013-10-31 DIAGNOSIS — C220 Liver cell carcinoma: Secondary | ICD-10-CM

## 2013-11-19 ENCOUNTER — Ambulatory Visit
Admission: RE | Admit: 2013-11-19 | Discharge: 2013-11-19 | Disposition: A | Discharge status: Home / Self Care | Payer: No Typology Code available for payment source | Source: Ambulatory Visit | Attending: Nurse Practitioner | Admitting: Nurse Practitioner

## 2013-11-19 DIAGNOSIS — C220 Liver cell carcinoma: Secondary | ICD-10-CM

## 2013-11-27 ENCOUNTER — Other Ambulatory Visit: Payer: Self-pay | Admitting: Nurse Practitioner

## 2013-11-27 DIAGNOSIS — K769 Liver disease, unspecified: Secondary | ICD-10-CM

## 2013-12-03 ENCOUNTER — Ambulatory Visit
Admission: RE | Admit: 2013-12-03 | Discharge: 2013-12-03 | Disposition: A | Discharge status: Home / Self Care | Payer: No Typology Code available for payment source | Source: Ambulatory Visit | Attending: Nurse Practitioner | Admitting: Nurse Practitioner

## 2013-12-03 DIAGNOSIS — K769 Liver disease, unspecified: Secondary | ICD-10-CM

## 2013-12-03 MED ORDER — GADOXETATE DISODIUM 0.25 MMOL/ML IV SOLN
7.0000 mL | Freq: Once | INTRAVENOUS | Status: AC | PRN
  Administered 2013-12-03: 7 mL via INTRAVENOUS

## 2014-02-05 ENCOUNTER — Other Ambulatory Visit: Payer: Self-pay

## 2014-02-05 DIAGNOSIS — Z1231 Encounter for screening mammogram for malignant neoplasm of breast: Secondary | ICD-10-CM

## 2014-02-13 ENCOUNTER — Ambulatory Visit: Payer: No Typology Code available for payment source

## 2014-02-16 ENCOUNTER — Other Ambulatory Visit: Payer: Self-pay | Admitting: Internal Medicine

## 2014-02-16 DIAGNOSIS — K759 Inflammatory liver disease, unspecified: Secondary | ICD-10-CM

## 2014-02-17 ENCOUNTER — Ambulatory Visit
Admission: RE | Admit: 2014-02-17 | Discharge: 2014-02-17 | Disposition: A | Discharge status: Home / Self Care | Payer: PRIVATE HEALTH INSURANCE | Source: Ambulatory Visit

## 2014-02-17 DIAGNOSIS — Z1231 Encounter for screening mammogram for malignant neoplasm of breast: Secondary | ICD-10-CM

## 2014-02-24 ENCOUNTER — Other Ambulatory Visit: Payer: Self-pay | Admitting: Obstetrics and Gynecology

## 2014-02-26 LAB — CYTOLOGY - PAP

## 2014-06-02 ENCOUNTER — Ambulatory Visit
Admission: RE | Admit: 2014-06-02 | Discharge: 2014-06-02 | Disposition: A | Discharge status: Home / Self Care | Payer: PRIVATE HEALTH INSURANCE | Source: Ambulatory Visit | Attending: Internal Medicine | Admitting: Internal Medicine

## 2014-06-02 DIAGNOSIS — K759 Inflammatory liver disease, unspecified: Secondary | ICD-10-CM

## 2014-11-02 ENCOUNTER — Other Ambulatory Visit: Payer: Self-pay | Admitting: Nurse Practitioner

## 2014-11-02 DIAGNOSIS — C220 Liver cell carcinoma: Secondary | ICD-10-CM

## 2014-11-02 DIAGNOSIS — K745 Biliary cirrhosis, unspecified: Secondary | ICD-10-CM

## 2014-11-17 ENCOUNTER — Other Ambulatory Visit (HOSPITAL_COMMUNITY): Payer: Self-pay | Admitting: Nurse Practitioner

## 2014-11-17 DIAGNOSIS — R748 Abnormal levels of other serum enzymes: Secondary | ICD-10-CM

## 2014-11-17 DIAGNOSIS — K754 Autoimmune hepatitis: Secondary | ICD-10-CM

## 2014-11-18 ENCOUNTER — Other Ambulatory Visit: Payer: Self-pay | Admitting: Nurse Practitioner

## 2014-11-18 DIAGNOSIS — R945 Abnormal results of liver function studies: Secondary | ICD-10-CM

## 2014-11-18 DIAGNOSIS — R7989 Other specified abnormal findings of blood chemistry: Secondary | ICD-10-CM

## 2014-11-24 ENCOUNTER — Ambulatory Visit
Admission: RE | Admit: 2014-11-24 | Discharge: 2014-11-24 | Disposition: A | Discharge status: Home / Self Care | Payer: PRIVATE HEALTH INSURANCE | Source: Ambulatory Visit | Attending: Nurse Practitioner | Admitting: Nurse Practitioner

## 2014-11-24 ENCOUNTER — Other Ambulatory Visit: Payer: Self-pay | Admitting: Radiology

## 2014-11-24 DIAGNOSIS — R945 Abnormal results of liver function studies: Secondary | ICD-10-CM

## 2014-11-24 DIAGNOSIS — R7989 Other specified abnormal findings of blood chemistry: Secondary | ICD-10-CM

## 2014-11-24 MED ORDER — GADOBENATE DIMEGLUMINE 529 MG/ML IV SOLN: 14.0000 mL | Freq: Once | INTRAVENOUS | Status: DC | PRN

## 2014-11-25 ENCOUNTER — Encounter (HOSPITAL_COMMUNITY): Payer: Self-pay

## 2014-11-25 ENCOUNTER — Ambulatory Visit (HOSPITAL_COMMUNITY)
Admission: RE | Admit: 2014-11-25 | Discharge: 2014-11-25 | Disposition: A | Discharge status: Home / Self Care | Payer: PRIVATE HEALTH INSURANCE | Source: Ambulatory Visit | Attending: Nurse Practitioner | Admitting: Nurse Practitioner

## 2014-11-25 DIAGNOSIS — K830 Cholangitis: Secondary | ICD-10-CM | POA: Insufficient documentation

## 2014-11-25 DIAGNOSIS — K7460 Unspecified cirrhosis of liver: Secondary | ICD-10-CM | POA: Diagnosis not present

## 2014-11-25 DIAGNOSIS — K739 Chronic hepatitis, unspecified: Secondary | ICD-10-CM | POA: Insufficient documentation

## 2014-11-25 DIAGNOSIS — R748 Abnormal levels of other serum enzymes: Secondary | ICD-10-CM | POA: Diagnosis not present

## 2014-11-25 DIAGNOSIS — Z79899 Other long term (current) drug therapy: Secondary | ICD-10-CM | POA: Insufficient documentation

## 2014-11-25 DIAGNOSIS — K754 Autoimmune hepatitis: Secondary | ICD-10-CM | POA: Diagnosis not present

## 2014-11-25 HISTORY — DX: Inflammatory liver disease, unspecified: K75.9

## 2014-11-25 LAB — CBC
HEMATOCRIT: 38.8 % (ref 36.0–46.0)
HEMOGLOBIN: 12.8 g/dL (ref 12.0–15.0)
MCH: 30.1 pg (ref 26.0–34.0)
MCHC: 33 g/dL (ref 30.0–36.0)
MCV: 91.3 fL (ref 78.0–100.0)
Platelets: 199 10*3/uL (ref 150–400)
RBC: 4.25 MIL/uL (ref 3.87–5.11)
RDW: 14.5 % (ref 11.5–15.5)
WBC: 4.9 10*3/uL (ref 4.0–10.5)

## 2014-11-25 LAB — APTT: APTT: 30 s (ref 24–37)

## 2014-11-25 LAB — PROTIME-INR
INR: 1.06 (ref 0.00–1.49)
Prothrombin Time: 14 seconds (ref 11.6–15.2)

## 2014-11-25 MED ORDER — FENTANYL CITRATE (PF) 100 MCG/2ML IJ SOLN
INTRAMUSCULAR | Status: AC
  Filled 2014-11-25: qty 4

## 2014-11-25 MED ORDER — FENTANYL CITRATE (PF) 100 MCG/2ML IJ SOLN
INTRAMUSCULAR | Status: AC | PRN
  Administered 2014-11-25: 50 ug via INTRAVENOUS
  Administered 2014-11-25: 25 ug via INTRAVENOUS

## 2014-11-25 MED ORDER — SODIUM CHLORIDE 0.9 % IV SOLN
Freq: Once | INTRAVENOUS | Status: AC
  Administered 2014-11-25: 500 mL via INTRAVENOUS

## 2014-11-25 MED ORDER — MIDAZOLAM HCL 2 MG/2ML IJ SOLN
INTRAMUSCULAR | Status: AC | PRN
  Administered 2014-11-25 (×4): 1 mg via INTRAVENOUS

## 2014-11-25 MED ORDER — MIDAZOLAM HCL 2 MG/2ML IJ SOLN
INTRAMUSCULAR | Status: AC
  Filled 2014-11-25: qty 6

## 2014-11-25 MED ORDER — HYDROCODONE-ACETAMINOPHEN 5-325 MG PO TABS
1.0000 | ORAL_TABLET | ORAL | Status: DC | PRN
  Filled 2014-11-25: qty 2

## 2014-11-25 NOTE — H&P (Signed)
Chief Complaint: Patient was seen in consultation today for liver biopsy at the request of Drazek,Dawn  Referring Physician(s): Drazek,Dawn  History of Present Illness: Barbara Knight is a 49 y.o. female with hx of autoimmune hepatitis. She has had prior random liver core biopsy. She has had a recent bump in her LFTs and she is referred for repeat biopsy today PMHx, meds, labs reviewed. She last ate before 0700 today. Feels well otherwise.  Past Medical History  Diagnosis Date  . Headache(784.0)     migraines  . Anemia   . Elevated LFTs     hepatitis virus was negative  . Hepatitis     autoimmune    Past Surgical History  Procedure Laterality Date  . Dilation and curettage of uterus    . Dilatation & curettage/hysteroscopy with trueclear N/A 09/13/2012    Procedure: DILATATION & CURETTAGE/HYSTEROSCOPY WITH TRUECLEAR and THERMACHOICE;  Surgeon: Cyril Mourning, MD;  Location: [facility];  Service: Gynecology;  Laterality: N/A;  . Dilatation & currettage/hysteroscopy with resectocope N/A 09/13/2012    Procedure: DILATATION & CURETTAGE/HYSTEROSCOPY WITH RESECTOCOPE;  Surgeon: Cyril Mourning, MD;  Location: [facility];  Service: Gynecology;  Laterality: N/A;    Allergies: Review of patient's allergies indicates no known allergies.  Medications: Prior to Admission medications   Medication Sig Start Date End Date Taking? Authorizing Provider  ketotifen (ALAWAY) 0.025 % ophthalmic solution Place 1 drop into both eyes daily.   Yes Historical Provider, MD  mycophenolate (CELLCEPT) 200 MG/ML suspension Take 360 mg by mouth 2 (two) times daily.   Yes Historical Provider, MD  azaTHIOprine (IMURAN) 50 MG tablet  04/07/13   Historical Provider, MD  ibuprofen (ADVIL) 200 MG tablet Take 3 tablets (600 mg total) by mouth every 6 (six) hours as needed for pain. 09/13/12   [name], MD  Multiple Vitamins-Iron (MULTI-VITAMIN/IRON) TABS Take 1 tablet by mouth daily.    Historical Provider,  MD  predniSONE (DELTASONE) 10 MG tablet  03/19/13   Historical Provider, MD     History reviewed. No pertinent family history.  Social History   Social History  . Marital Status: Married    Spouse Name: N/A  . Number of Children: N/A  . Years of Education: N/A   Social History Main Topics  . Smoking status: Never Smoker   . Smokeless tobacco: None  . Alcohol Use: Yes     Comment: none 3-4 weeks  . Drug Use: No  . Sexual Activity: Not Asked   Other Topics Concern  . None   Social History Narrative     Review of Systems: A 12 point ROS discussed and pertinent positives are indicated in the HPI above.  All other systems are negative.  Review of Systems  Vital Signs: BP 118/77 mmHg  Pulse 60  Temp(Src) 98.2 F (36.8 C) (Oral)  Resp 16  Ht '5\' 7"'$  (1.702 m)  Wt 154 lb 8 oz (70.081 kg)  BMI 24.19 kg/m2  SpO2 99%  LMP 11/11/2014 (Approximate)  Physical Exam  Constitutional: She is oriented to person, place, and time. She appears well-developed and well-nourished. No distress.  HENT:  Head: Normocephalic.  Mouth/Throat: Oropharynx is clear and moist.  Neck: Normal range of motion. No JVD present. No tracheal deviation present.  Cardiovascular: Normal rate, regular rhythm and normal heart sounds.   Pulmonary/Chest: Effort normal and breath sounds normal. No respiratory distress.  Abdominal: Soft. She exhibits no distension and no mass. There is no tenderness.  Neurological: She is alert and oriented to person, place, and time.  Psychiatric: She has a normal mood and affect. Judgment normal.    Mallampati Score:  MD Evaluation Airway: WNL Heart: WNL Abdomen: WNL Chest/ Lungs: WNL ASA  Classification: 2 Mallampati/Airway Score: One   Labs:  CBC:  Recent Labs  11/25/14 1120  WBC 4.9  HGB 12.8  HCT 38.8  PLT 199    COAGS:  Recent Labs  11/25/14 1120  INR 1.06  APTT 30    Assessment and Plan: Hx of autoimmune hepatitis and elevated LFTs For  US guided random liver core biopsy. Labs ok Risks and Benefits discussed with the patient including, but not limited to bleeding, infection, damage to adjacent structures or low yield requiring additional tests. All of the patient's questions were answered, patient is agreeable to proceed. Consent signed and in chart.   SignedAscencion Dike 11/25/2014, 12:28 PM

## 2014-11-25 NOTE — Procedures (Signed)
Interventional Radiology Procedure Note  Procedure:  Ultrasound guided core biopsy of liver  Complications:  None  Estimated Blood Loss: < 10 mL  18 G core biopsy x 2 via 17 G needle.  No complications. 2 hour recovery.  Venetia Night. Kathlene Cote, M.D Pager:  [phone]

## 2014-11-25 NOTE — Discharge Instructions (Signed)
Liver Biopsy, Care After Refer to this sheet in the next few weeks. These instructions provide you with information on caring for yourself after your procedure. Your health care provider may also give you more specific instructions. Your treatment has been planned according to current medical practices, but problems sometimes occur. Call your health care provider if you have any problems or questions after your procedure. WHAT TO EXPECT AFTER THE PROCEDURE After your procedure, it is typical to have the following:  A small amount of discomfort in the area where the biopsy was done and in the right shoulder or shoulder blade.  A small amount of bruising around the area where the biopsy was done and on the skin over the liver.  Sleepiness and fatigue for the rest of the day. HOME CARE INSTRUCTIONS   Rest at home for 1-2 days or as directed by your health care provider.  Have a friend or family member stay with you for at least 24 hours.  Because of the medicines used during the procedure, you should not do the following things in the first 24 hours:  Drive.  Use machinery.  Be responsible for the care of other people.  Sign legal documents.  Take a bath or shower.  There are many different ways to close and cover an incision, including stitches, skin glue, and adhesive strips. Follow your health care provider's instructions on:  Incision care.  Bandage (dressing) changes and removal.  Incision closure removal.  Do not drink alcohol in the first week.  Do not lift more than 5 pounds or play contact sports for 2 weeks after this test.  Take medicines only as directed by your health care provider. Do not take medicine containing aspirin or non-steroidal anti-inflammatory medicines such as ibuprofen for 1 week after this test.  It is your responsibility to get your test results. SEEK MEDICAL CARE IF:   You have increased bleeding from an incision that results in more than a  small spot of blood.  You have redness, swelling, or increasing pain in any incisions.  You notice a discharge or a bad smell coming from any of your incisions.  You have a fever or chills. SEEK IMMEDIATE MEDICAL CARE IF:   You develop swelling, bloating, or pain in your abdomen.  You become dizzy or faint.  You develop a rash.  You are nauseous or vomit.  You have difficulty breathing, feel short of breath, or feel faint.  You develop chest pain.  You have problems with your speech or vision.  You have trouble balancing or moving your arms or legs.   This information is not intended to replace advice given to you by your health care provider. Make sure you discuss any questions you have with your health care provider.   Document Released: 07/29/2004 Document Revised: 01/30/2014 Document Reviewed: 03/07/2013 Elsevier Interactive Patient Education 2016 Elsevier Inc[employer] Liver Biopsy The liver is a large organ in the upper right-hand side of your abdomen. A liver biopsy is a procedure in which a tissue sample is taken from the liver and examined under a microscope. The procedure is done to confirm a suspected problem. There are three types of liver biopsies:  Percutaneous. In this type, an incision is made in your abdomen. The sample is removed through the incision with a needle.  Laparoscopic. In this type, several incisions are made in the abdomen. A tiny camera is passed through one of the incisions to help guide the health care provider.  The sample is removed through the other incision or incisions. °· Transjugular. In this type, an incision is made in the neck. A tube is passed through the incision to the liver. The sample is removed through the tube with a needle. °LET YOUR HEALTH CARE PROVIDER KNOW ABOUT: °· Any allergies you have. °· All medicines you are taking, including vitamins, herbs, eye drops, creams, and over-the-counter medicines. °· Previous problems you or members of  your family have had with the use of anesthetics. °· Any blood disorders you have. °· Previous surgeries you have had. °· Medical conditions you have. °· Possibility of pregnancy, if this applies. °RISKS AND COMPLICATIONS °Generally, this is a safe procedure. However, problems can occur and include: °· Bleeding. °· Infection. °· Bruising. °· Collapsed lung. °· Leak of digestive juices (bile) from the liver or gallbladder. °· Problems with heart rhythm. °· Pain at the biopsy site or in the right shoulder. °· Low blood pressure (hypotension). °· Injury to nearby organs or tissues. °BEFORE THE PROCEDURE °· Your health care provider may do some blood or urine tests. These will help your health care provider learn how well your kidneys and liver are working and how well your blood clots. °· Ask your health care provider if you will be able to go home the day of the procedure. Arrange for someone to take you home and stay with you for at least 24 hours. °· Do not eat or drink anything after midnight on the night before the procedure or as directed by your health care provider. °· Ask your health care provider about: °· Changing or stopping your regular medicines. This is especially important if you are taking diabetes medicines or blood thinners. °· Taking medicines such as aspirin and ibuprofen. These medicines can thin your blood. Do not take these medicines before your procedure if your health care provider asks you not to. °PROCEDURE °Regardless of the type of biopsy that will be done, you will have an IV line placed. Through this line, you will receive fluids and medicine to relax you. If you will be having a laparoscopic biopsy, you may also receive medicine through this line to make you sleep during the procedure (general anesthetic). °Percutaneous Liver Biopsy °· You will positioned on your back, with your right hand over your head. °· A health care provider will locate your liver by tapping and pressing on the  right side of your abdomen or with the help of an ultrasound machine or CT scan. °· An area at the bottom of your last right rib will be numbed. °· An incision will be made in the numbed area. °· The biopsy needle will be inserted into the incision. °· Several samples of liver tissue will be taken with the biopsy needle. You will be asked to hold your breath as each sample is taken. °Laparoscopic Liver Biopsy °· You will be positioned on your back. °· Several small incisions will be made in your abdomen. °· Your doctor will pass a tiny camera through one incision. The camera will allow the liver to be viewed on a TV monitor in the operating room. °· Tools will be passed through the other incision or incisions. These tools will be used to remove samples of liver tissue. °Transjugular Liver Biopsy °· You will be positioned on your back on an X-ray table, with your head turned to your left. °· An area on your neck just over your jugular vein will be numbed. °· An incision   will be made in the numbed area. °· A tiny tube will be inserted through the incision. It will be pushed through the jugular vein to a blood vessel in the liver called the hepatic vein. °· Dye will be inserted through the tube, and X-rays will be taken. The dye will make the blood vessels in the liver light up on the X-rays. °· The biopsy needle will be pushed through the tube until it reaches the liver. °· Samples of liver tissue will be taken with the biopsy needle. °· The needle and the tube will be removed. °After the samples are obtained, the incision or incisions will be closed. °AFTER THE PROCEDURE °· You will be taken to a recovery area. °· You may have to lie on your right side for 1-2 hours. This will prevent bleeding from the biopsy site. °· Your progress will be watched. Your blood pressure, pulse, and the biopsy site will be checked often. °· You may have some pain or feel sick. If this happens, tell your health care provider. °· As you  begin to feel better, you will be offered ice and beverages. °· You may be allowed to go home when the medicines have worn off and you can walk, drink, eat, and use the bathroom. °  °This information is not intended to replace advice given to you by your health care provider. Make sure you discuss any questions you have with your health care provider. °  °Document Released: 04/01/2003 Document Revised: 01/30/2014 Document Reviewed: 03/07/2013 °Elsevier Interactive Patient Education ©2016 Elsevier Inc[employer] °Moderate Conscious Sedation, Adult °Sedation is the use of medicines to promote relaxation and relieve discomfort and anxiety. Moderate conscious sedation is a type of sedation. Under moderate conscious sedation you are less alert than normal but are still able to respond to instructions or stimulation. Moderate conscious sedation is used during short medical and dental procedures. It is milder than deep sedation or general anesthesia and allows you to return to your regular activities sooner. °LET YOUR HEALTH CARE PROVIDER KNOW ABOUT:  °· Any allergies you have. °· All medicines you are taking, including vitamins, herbs, eye drops, creams, and over-the-counter medicines. °· Use of steroids (by mouth or creams). °· Previous problems you or members of your family have had with the use of anesthetics. °· Any blood disorders you have. °· Previous surgeries you have had. °· Medical conditions you have. °· Possibility of pregnancy, if this applies. °· Use of cigarettes, alcohol, or illegal drugs. °RISKS AND COMPLICATIONS °Generally, this is a safe procedure. However, as with any procedure, problems can occur. Possible problems include: °· Oversedation. °· Trouble breathing on your own. You may need to have a breathing tube until you are awake and breathing on your own. °· Allergic reaction to any of the medicines used for the procedure. °BEFORE THE PROCEDURE °· You may have blood tests done. These tests can help show how  well your kidneys and liver are working. They can also show how well your blood clots. °· A physical exam will be done.   °· Only take medicines as directed by your health care provider. You may need to stop taking medicines (such as blood thinners, aspirin, or nonsteroidal anti-inflammatory drugs) before the procedure.   °· Do not eat or drink at least 6 hours before the procedure or as directed by your health care provider. °· Arrange for a responsible adult, family member, or friend to take you home after the procedure. He or she should stay with   you for at least 24 hours after the procedure, until the medicine has worn off. PROCEDURE   An intravenous (IV) catheter will be inserted into one of your veins. Medicine will be able to flow directly into your body through this catheter. You may be given medicine through this tube to help prevent pain and help you relax.  The medical or dental procedure will be done. AFTER THE PROCEDURE  You will stay in a recovery area until the medicine has worn off. Your blood pressure and pulse will be checked.   Depending on the procedure you had, you may be allowed to go home when you can tolerate liquids and your pain is under control.   This information is not intended to replace advice given to you by your health care provider. Make sure you discuss any questions you have with your health care provider.   Document Released: 10/04/2000 Document Revised: 01/30/2014 Document Reviewed: 09/16/2012 Elsevier Interactive Patient Education 2016 Elsevier Inc. Moderate Conscious Sedation, Adult, Care After Refer to this sheet in the next few weeks. These instructions provide you with information on caring for yourself after your procedure. Your health care provider may also give you more specific instructions. Your treatment has been planned according to current medical practices, but problems sometimes occur. Call your health care provider if you have any problems or  questions after your procedure. WHAT TO EXPECT AFTER THE PROCEDURE  After your procedure:  You may feel sleepy, clumsy, and have poor balance for several hours.  Vomiting may occur if you eat too soon after the procedure. HOME CARE INSTRUCTIONS  Do not participate in any activities where you could become injured for at least 24 hours. Do not:  Drive.  Swim.  Ride a bicycle.  Operate heavy machinery.  Cook.  Use power tools.  Climb ladders.  Work from a high place.  Do not make important decisions or sign legal documents until you are improved.  If you vomit, drink water, juice, or soup when you can drink without vomiting. Make sure you have little or no nausea before eating solid foods.  Only take over-the-counter or prescription medicines for pain, discomfort, or fever as directed by your health care provider.  Make sure you and your family fully understand everything about the medicines given to you, including what side effects may occur.  You should not drink alcohol, take sleeping pills, or take medicines that cause drowsiness for at least 24 hours.  If you smoke, do not smoke without supervision.  If you are feeling better, you may resume normal activities 24 hours after you were sedated.  Keep all appointments with your health care provider. SEEK MEDICAL CARE IF:  Your skin is pale or bluish in color.  You continue to feel nauseous or vomit.  Your pain is getting worse and is not helped by medicine.  You have bleeding or swelling.  You are still sleepy or feeling clumsy after 24 hours. SEEK IMMEDIATE MEDICAL CARE IF:  You develop a rash.  You have difficulty breathing.  You develop any type of allergic problem.  You have a fever. MAKE SURE YOU:  Understand these instructions.  Will watch your condition.  Will get help right away if you are not doing well or get worse.   This information is not intended to replace advice given to you by your  health care provider. Make sure you discuss any questions you have with your health care provider.   Document Released: 10/30/2012  Document Revised: 01/30/2014 Document Reviewed: 10/30/2012 Elsevier Interactive Patient Education [employer].

## 2014-12-01 ENCOUNTER — Telehealth: Payer: Self-pay | Admitting: Cardiology

## 2014-12-01 DIAGNOSIS — R002 Palpitations: Secondary | ICD-10-CM

## 2014-12-01 DIAGNOSIS — R079 Chest pain, unspecified: Secondary | ICD-10-CM

## 2014-12-01 NOTE — Telephone Encounter (Addendum)
ECHO and event monitor ordered for scheduling. Patient understands she will have EKG done the same day. FU OV scheduled 11/22. Patient agrees with treatment plan.

## 2014-12-01 NOTE — Addendum Note (Signed)
Addended by: Harland German A on: 12/01/2014 04:48 PM   Modules accepted: Orders

## 2014-12-01 NOTE — Telephone Encounter (Signed)
Patient contacted me complaining of palpitations with irregularly irregular heart beat and epigastric discomfort.  Currently being worked up and treated for autoimmune hepatitis and has had RUQ pain.  Please set her up ASAP for 30 day event monitor as well as 2D echo.  I would like a 12 lead EKG done the same day she comes in for heart monitor and set up appt with me in 2-3 weeks

## 2014-12-01 NOTE — Telephone Encounter (Signed)
Patient st she is busy and to call her back in 45 minutes.

## 2014-12-08 ENCOUNTER — Ambulatory Visit (HOSPITAL_COMMUNITY): Payer: PRIVATE HEALTH INSURANCE | Attending: Cardiology

## 2014-12-08 ENCOUNTER — Ambulatory Visit (INDEPENDENT_AMBULATORY_CARE_PROVIDER_SITE_OTHER): Payer: PRIVATE HEALTH INSURANCE

## 2014-12-08 ENCOUNTER — Ambulatory Visit (INDEPENDENT_AMBULATORY_CARE_PROVIDER_SITE_OTHER): Payer: PRIVATE HEALTH INSURANCE | Admitting: *Deleted

## 2014-12-08 ENCOUNTER — Other Ambulatory Visit: Payer: Self-pay

## 2014-12-08 VITALS — HR 48 | Resp 18 | Ht 67.0 in | Wt 157.0 lb

## 2014-12-08 DIAGNOSIS — I341 Nonrheumatic mitral (valve) prolapse: Secondary | ICD-10-CM | POA: Diagnosis not present

## 2014-12-08 DIAGNOSIS — R002 Palpitations: Secondary | ICD-10-CM

## 2014-12-08 DIAGNOSIS — R079 Chest pain, unspecified: Secondary | ICD-10-CM | POA: Insufficient documentation

## 2014-12-08 DIAGNOSIS — I340 Nonrheumatic mitral (valve) insufficiency: Secondary | ICD-10-CM | POA: Insufficient documentation

## 2014-12-08 NOTE — Patient Instructions (Signed)
1.) Reason for visit: EKG for palps per Dr Radford Pax  2.) Name of MD requesting visit: Dr Radford Pax  3.) H&P: here today for the evaluation of palpitations and c/o irregularly irregular HB.  4.) ROS related to problem: no complaints today - not sleeping well d/t prednisone   5.) Assessment and plan per MD: follow up after testing

## 2014-12-15 ENCOUNTER — Ambulatory Visit (INDEPENDENT_AMBULATORY_CARE_PROVIDER_SITE_OTHER): Payer: PRIVATE HEALTH INSURANCE | Admitting: Cardiology

## 2014-12-15 ENCOUNTER — Encounter: Payer: Self-pay | Admitting: Cardiology

## 2014-12-15 VITALS — BP 104/80 | HR 59 | Ht 67.0 in | Wt 157.0 lb

## 2014-12-15 DIAGNOSIS — I4719 Other supraventricular tachycardia: Secondary | ICD-10-CM

## 2014-12-15 DIAGNOSIS — I341 Nonrheumatic mitral (valve) prolapse: Secondary | ICD-10-CM | POA: Diagnosis not present

## 2014-12-15 DIAGNOSIS — I471 Supraventricular tachycardia: Secondary | ICD-10-CM

## 2014-12-15 DIAGNOSIS — Z5181 Encounter for therapeutic drug level monitoring: Secondary | ICD-10-CM

## 2014-12-15 DIAGNOSIS — R002 Palpitations: Secondary | ICD-10-CM

## 2014-12-15 DIAGNOSIS — R001 Bradycardia, unspecified: Secondary | ICD-10-CM

## 2014-12-15 NOTE — Progress Notes (Signed)
Cardiology Office Note   Date:  12/15/2014   ID:  [name], [place] 03-29-1965, MRN [id number]  PCP:  Cyril Mourning, MD    Chief Complaint  Patient presents with  . Palpitations    no refills      History of Present Illness: Barbara Knight is a 49 y.o. female who presents for evaluation of palpitations. She has a history of migraine HAs, elevated LFTs and was diagnosed with autoimmune hepatits which is followed in [place].  She recently was in [place] at her son's [place] game and suddenly noticed palpitations.  Her husband who is a Publishing rights manager, took her pulse and noted that it was irregular.  It lasted a short period of time and resolved.  She has had other episodes of palpitations in her chest as well.  She says that sometimes when it happens it makes her chest hurt.  She also will notice that it takes her breath.  She is very active and works out without any problems.  She denies exertional chest pain, SOB, DOE, LE edema, dizziness or syncope.  She denies any OTC decongestants.  She does drink 2 cups of caffienated coffee daily.  She says that there are studies showing that the caffeine reduced inflammation of the liver.  She occasionally drinks alcohol but never more than 1 glass and usually only one glass a week.  She recently was placed on a steroid taper over 3 months for worsening LFTs and also is now on cyclosporine.  She is supposed to have her lipids checked after being on the cyclosporine.  She is now here for further evaluation.  She has no family history of arrhythmia, cardiac disease or SCD.    Past Medical History  Diagnosis Date  . Headache(784.0)     migraines  . Anemia   . Elevated LFTs     hepatitis virus was negative  . Hepatitis     autoimmune  . Mitral valvular prolapse     posterior mitral vavlve leaflet with mild MR    Past Surgical History  Procedure Laterality Date  . Dilation and curettage of uterus    . Dilatation &  curettage/hysteroscopy with trueclear N/A 09/13/2012    Procedure: DILATATION & CURETTAGE/HYSTEROSCOPY WITH TRUECLEAR and THERMACHOICE;  Surgeon: Cyril Mourning, MD;  Location: [facility];  Service: Gynecology;  Laterality: N/A;  . Dilatation & currettage/hysteroscopy with resectocope N/A 09/13/2012    Procedure: DILATATION & CURETTAGE/HYSTEROSCOPY WITH RESECTOCOPE;  Surgeon: Cyril Mourning, MD;  Location: [facility];  Service: Gynecology;  Laterality: N/A;  . Liver biopsy      x 2     Current Outpatient Prescriptions  Medication Sig Dispense Refill  . cycloSPORINE modified (NEORAL) 50 MG capsule Take 50 mg by mouth 2 (two) times daily.  3  . ibuprofen (ADVIL) 200 MG tablet Take 3 tablets (600 mg total) by mouth every 6 (six) hours as needed for pain. 30 tablet 0  . ketotifen (ALAWAY) 0.025 % ophthalmic solution Place 1 drop into both eyes daily as needed (itching).     . Multiple Vitamins-Iron (MULTI-VITAMIN/IRON) TABS Take 1 tablet by mouth daily.    . predniSONE (DELTASONE) 10 MG tablet Take 40 mg by mouth daily with breakfast.     . ursodiol (ACTIGALL) 500 MG tablet Take 500 mg by mouth 2 (two) times daily.  3   No current facility-administered medications for  this visit.    Allergies:   Review of patient's allergies indicates no known allergies.    Social History:  The patient  reports that she has never smoked. She does not have any smokeless tobacco history on file. She reports that she drinks alcohol. She reports that she does not use illicit drugs.   Family History:  The patient's family history includes Barrett's esophagus in her mother; Hypertension in her father; Lung cancer in her mother.    ROS:  Please see the history of present illness.   Otherwise, review of systems are positive for none.   All other systems are reviewed and negative.    PHYSICAL EXAM: VS:  BP 104/80 mmHg  Pulse 59  Ht '5\' 7"'$  (7.544 m)  Wt 71.215 kg (157 lb)  BMI 24.58 kg/m2  LMP 11/11/2014  (Approximate) , BMI Body mass index is 24.58 kg/(m^2). GEN: Well nourished, well developed, in no acute distress HEENT: normal Neck: no JVD, carotid bruits, or masses Cardiac: RRR; no murmurs, rubs, or gallops,no edema  Respiratory:  clear to auscultation bilaterally, normal work of breathing GI: soft, nontender, nondistended, + BS MS: no deformity or atrophy Skin: warm and dry, no rash Neuro:  Strength and sensation are intact Psych: euthymic mood, full affect   EKG:  EKG is not ordered today.    Recent Labs: 11/25/2014: Hemoglobin 12.8; Platelets 199    Lipid Panel No results found for: CHOL, TRIG, HDL, CHOLHDL, VLDL, LDLCALC, LDLDIRECT    Wt Readings from Last 3 Encounters:  12/15/14 71.215 kg (157 lb)  12/08/14 71.215 kg (157 lb)  11/25/14 70.081 kg (154 lb 8 oz)       ASSESSMENT AND PLAN:  1.  Palpitations - she is currently wearing an event monitor which has shown nonsustained atrial tachycardia and PAC's with aberration.  2D echo showed normal LVF.  I have reassured her that these are benign but can increase risk of PAF down the road.  She will continue to wear the monitor to make sure she is not having any PAF.  Treatment of atrial tachycardia is limited due to her resting bradycardia.  At this time she says that it is not bad enough to take meds.  I will check a TSH and Mag.  She was found to have MVP and I explained that atrial arrhythmias are frequently noted with MVP. 2.  Asymptomatic bradycardia.  Baseline EKG shows sinus bradycardia at 48 bpm with normal QTc. 3.  Mitral valve prolapse of the posterior MVL with mild MR noted at recent echo.  She does not need SBE prophylaxis but will need echo repeated every few years.   4.  Autoimmune hepatitis followed in [place] and now on Cyclosporine and steroids.  Will check a fasting lipid panel at her request due to interaction with cyclosporine.   Current medicines are reviewed at length with the patient today.  The  patient does not have concerns regarding medicines.  The following changes have been made:  no change  Labs/ tests ordered today: See above Assessment and Plan No orders of the defined types were placed in this encounter.     Disposition:   FU with me in 6 months  Signed, Sueanne Margarita, MD  12/15/2014 4:38 PM    [facility] [place], [place], [place]  [address] Phone: [phone]; Fax: [phone]

## 2014-12-15 NOTE — Patient Instructions (Signed)
Medication Instructions:  Your physician recommends that you continue on your current medications as directed. Please refer to the Current Medication list given to you today.   Labwork: Your physician recommends that you return for FASTING lab work on Tuesday, November 29. You may come ANY TIME between 7:30 AM and 5:00PM.  Testing/Procedures: None  Follow-Up: Your physician wants you to follow-up in: 6 months with Dr. Radford Pax. You will receive a reminder letter in the mail two months in advance. If you don't receive a letter, please call our office to schedule the follow-up appointment.   Any Other Special Instructions Will Be Listed Below (If Applicable).     If you need a refill on your cardiac medications before your next appointment, please call your pharmacy.

## 2014-12-16 ENCOUNTER — Telehealth: Payer: Self-pay | Admitting: Cardiology

## 2014-12-16 ENCOUNTER — Encounter: Payer: Self-pay | Admitting: Cardiology

## 2014-12-16 DIAGNOSIS — R001 Bradycardia, unspecified: Secondary | ICD-10-CM

## 2014-12-16 DIAGNOSIS — R002 Palpitations: Secondary | ICD-10-CM | POA: Insufficient documentation

## 2014-12-16 HISTORY — DX: Bradycardia, unspecified: R00.1

## 2014-12-16 NOTE — Telephone Encounter (Signed)
Monitor strips reviewed with Dr. Cheryln Manly.  She had a 7-10 beat run of SVT at 300bpm that is most likely paroxysmal atrial flutter with 1:1 conduction.  Unfortunately her resting HR of 48bpm precludes using BB or CCB at this time for suppression.  She tolerates them fine at this time. Her CHADS2VASC score is 1 (female) so no need to anticoagulation if this is atrial flutter.  I have discussed this with Darius Bump.  If she has more frequent palpitations or becomes more symptomatic then may need to consider EP study with possible ablation if aflutter.  At this time will continue to monitor.  Hopefully her palpitations will improve as she is tapered off steroids.

## 2014-12-22 ENCOUNTER — Other Ambulatory Visit: Payer: Self-pay

## 2014-12-22 ENCOUNTER — Other Ambulatory Visit (INDEPENDENT_AMBULATORY_CARE_PROVIDER_SITE_OTHER): Payer: PRIVATE HEALTH INSURANCE

## 2014-12-22 DIAGNOSIS — I471 Supraventricular tachycardia: Secondary | ICD-10-CM | POA: Diagnosis not present

## 2014-12-22 DIAGNOSIS — Z5181 Encounter for therapeutic drug level monitoring: Secondary | ICD-10-CM

## 2014-12-22 LAB — COMPREHENSIVE METABOLIC PANEL
ALK PHOS: 128 U/L (ref 33–130)
ALT: 22 U/L (ref 6–29)
AST: 18 U/L (ref 10–35)
Albumin: 3.7 g/dL (ref 3.6–5.1)
BUN: 26 mg/dL — ABNORMAL HIGH (ref 7–25)
CALCIUM: 8.7 mg/dL (ref 8.6–10.4)
CHLORIDE: 102 mmol/L (ref 98–110)
CO2: 28 mmol/L (ref 20–31)
Creat: 0.72 mg/dL (ref 0.50–1.05)
GLUCOSE: 80 mg/dL (ref 65–99)
POTASSIUM: 3.7 mmol/L (ref 3.5–5.3)
Sodium: 138 mmol/L (ref 135–146)
Total Bilirubin: 0.9 mg/dL (ref 0.2–1.2)
Total Protein: 6.3 g/dL (ref 6.1–8.1)

## 2014-12-22 LAB — LIPID PANEL
CHOL/HDL RATIO: 2 ratio (ref <=5.0)
Cholesterol: 188 mg/dL (ref 125–200)
HDL: 93 mg/dL (ref >=46)
LDL CALC: 81 mg/dL (ref <130)
TRIGLYCERIDES: 68 mg/dL (ref <150)
VLDL: 14 mg/dL (ref <30)

## 2014-12-22 LAB — TSH: TSH: 3.439 u[IU]/mL (ref 0.350–4.500)

## 2014-12-22 LAB — MAGNESIUM: MAGNESIUM: 1.9 mg/dL (ref 1.5–2.5)

## 2014-12-23 LAB — CYCLOSPORINE LEVEL: Cyclosporine, Blood: 48 ng/mL — ABNORMAL LOW (ref 140–330)

## 2014-12-30 ENCOUNTER — Telehealth: Payer: Self-pay | Admitting: Cardiology

## 2014-12-30 DIAGNOSIS — K754 Autoimmune hepatitis: Secondary | ICD-10-CM

## 2014-12-30 NOTE — Telephone Encounter (Signed)
Patient needs cyclosporin trough level, CBC with diff, CMET q weekly( on Wednesdays) - Dx code K75.4.  All results should be faxed to Dr. Charlie Pitter at [phone] weekly starting next Wednesday.

## 2015-01-05 NOTE — Addendum Note (Signed)
Addended by: Harland German A on: 01/05/2015 10:56 AM   Modules accepted: Orders

## 2015-01-05 NOTE — Telephone Encounter (Signed)
Lab work ordered

## 2015-01-06 ENCOUNTER — Other Ambulatory Visit: Payer: PRIVATE HEALTH INSURANCE

## 2015-01-07 ENCOUNTER — Other Ambulatory Visit: Payer: PRIVATE HEALTH INSURANCE

## 2015-01-07 NOTE — Telephone Encounter (Signed)
Spoke to patient, who st she will send a message via MyChart to notify when to schedule lab work.

## 2015-01-08 NOTE — Addendum Note (Signed)
Addended by: Harland German A on: 01/08/2015 03:33 PM   Modules accepted: Orders

## 2015-01-08 NOTE — Telephone Encounter (Signed)
Per Dr. Radford Pax, scheduled lab work for Monday.  Labs ordered and scheduled.

## 2015-01-11 ENCOUNTER — Other Ambulatory Visit (INDEPENDENT_AMBULATORY_CARE_PROVIDER_SITE_OTHER): Payer: PRIVATE HEALTH INSURANCE | Admitting: *Deleted

## 2015-01-11 DIAGNOSIS — K754 Autoimmune hepatitis: Secondary | ICD-10-CM | POA: Diagnosis not present

## 2015-01-11 LAB — CBC WITH DIFFERENTIAL/PLATELET
BASOS PCT: 0 % (ref 0–1)
Basophils Absolute: 0 10*3/uL (ref 0.0–0.1)
EOS ABS: 0.1 10*3/uL (ref 0.0–0.7)
Eosinophils Relative: 1 % (ref 0–5)
HCT: 38.7 % (ref 36.0–46.0)
HEMOGLOBIN: 13 g/dL (ref 12.0–15.0)
Lymphocytes Relative: 37 % (ref 12–46)
Lymphs Abs: 2.9 10*3/uL (ref 0.7–4.0)
MCH: 30 pg (ref 26.0–34.0)
MCHC: 33.6 g/dL (ref 30.0–36.0)
MCV: 89.4 fL (ref 78.0–100.0)
MONO ABS: 0.8 10*3/uL (ref 0.1–1.0)
MONOS PCT: 10 % (ref 3–12)
MPV: 10.2 fL (ref 8.6–12.4)
NEUTROS ABS: 4.1 10*3/uL (ref 1.7–7.7)
NEUTROS PCT: 52 % (ref 43–77)
PLATELETS: 181 10*3/uL (ref 150–400)
RBC: 4.33 MIL/uL (ref 3.87–5.11)
RDW: 14.2 % (ref 11.5–15.5)
WBC: 7.9 10*3/uL (ref 4.0–10.5)

## 2015-01-11 LAB — COMPREHENSIVE METABOLIC PANEL
ALBUMIN: 3.3 g/dL — AB (ref 3.6–5.1)
ALK PHOS: 78 U/L (ref 33–130)
ALT: 18 U/L (ref 6–29)
AST: 16 U/L (ref 10–35)
BILIRUBIN TOTAL: 0.7 mg/dL (ref 0.2–1.2)
BUN: 19 mg/dL (ref 7–25)
CHLORIDE: 105 mmol/L (ref 98–110)
CO2: 27 mmol/L (ref 20–31)
CREATININE: 0.68 mg/dL (ref 0.50–1.05)
Calcium: 8.3 mg/dL — ABNORMAL LOW (ref 8.6–10.4)
GLUCOSE: 85 mg/dL (ref 65–99)
Potassium: 3.9 mmol/L (ref 3.5–5.3)
SODIUM: 138 mmol/L (ref 135–146)
Total Protein: 6 g/dL — ABNORMAL LOW (ref 6.1–8.1)

## 2015-01-13 LAB — CYCLOSPORINE LEVEL: CYCLOSPORINE, BLOOD: 83 ng/mL — AB (ref 140–330)

## 2015-02-08 NOTE — Telephone Encounter (Signed)
Called patient to F/U on lab appointments. Patient st Dr. Claudia Pollock has been out of town. She will call and see when lab work needs to be drawn and contact our office to schedule. Patient grateful for follow-up.

## 2015-03-12 ENCOUNTER — Other Ambulatory Visit: Payer: Self-pay

## 2015-03-12 ENCOUNTER — Encounter: Payer: Self-pay | Admitting: Cardiology

## 2015-03-12 DIAGNOSIS — K754 Autoimmune hepatitis: Secondary | ICD-10-CM

## 2015-03-18 ENCOUNTER — Other Ambulatory Visit (INDEPENDENT_AMBULATORY_CARE_PROVIDER_SITE_OTHER): Payer: PRIVATE HEALTH INSURANCE | Admitting: *Deleted

## 2015-03-18 DIAGNOSIS — K754 Autoimmune hepatitis: Secondary | ICD-10-CM | POA: Diagnosis not present

## 2015-03-18 LAB — CBC WITH DIFFERENTIAL/PLATELET
BASOS ABS: 0 10*3/uL (ref 0.0–0.1)
BASOS PCT: 0 % (ref 0–1)
EOS PCT: 2 % (ref 0–5)
Eosinophils Absolute: 0.1 10*3/uL (ref 0.0–0.7)
HEMATOCRIT: 39.9 % (ref 36.0–46.0)
HEMOGLOBIN: 13.4 g/dL (ref 12.0–15.0)
LYMPHS PCT: 28 % (ref 12–46)
Lymphs Abs: 1.5 10*3/uL (ref 0.7–4.0)
MCH: 29.9 pg (ref 26.0–34.0)
MCHC: 33.6 g/dL (ref 30.0–36.0)
MCV: 89.1 fL (ref 78.0–100.0)
MPV: 10.7 fL (ref 8.6–12.4)
Monocytes Absolute: 0.8 10*3/uL (ref 0.1–1.0)
Monocytes Relative: 16 % — ABNORMAL HIGH (ref 3–12)
NEUTROS ABS: 2.8 10*3/uL (ref 1.7–7.7)
Neutrophils Relative %: 54 % (ref 43–77)
Platelets: 163 10*3/uL (ref 150–400)
RBC: 4.48 MIL/uL (ref 3.87–5.11)
RDW: 13.6 % (ref 11.5–15.5)
WBC: 5.2 10*3/uL (ref 4.0–10.5)

## 2015-03-18 LAB — COMPREHENSIVE METABOLIC PANEL
ALK PHOS: 109 U/L (ref 33–130)
ALT: 24 U/L (ref 6–29)
AST: 24 U/L (ref 10–35)
Albumin: 3.8 g/dL (ref 3.6–5.1)
BILIRUBIN TOTAL: 0.8 mg/dL (ref 0.2–1.2)
BUN: 17 mg/dL (ref 7–25)
CALCIUM: 8.3 mg/dL — AB (ref 8.6–10.4)
CO2: 20 mmol/L (ref 20–31)
CREATININE: 0.74 mg/dL (ref 0.50–1.05)
Chloride: 105 mmol/L (ref 98–110)
GLUCOSE: 95 mg/dL (ref 65–99)
Potassium: 4.2 mmol/L (ref 3.5–5.3)
SODIUM: 136 mmol/L (ref 135–146)
Total Protein: 6.6 g/dL (ref 6.1–8.1)

## 2015-03-19 LAB — CYCLOSPORINE LEVEL: CYCLOSPORINE, BLOOD: 84 ng/mL — AB (ref 140–330)

## 2015-03-29 ENCOUNTER — Other Ambulatory Visit: Payer: Self-pay

## 2015-03-29 DIAGNOSIS — Z1231 Encounter for screening mammogram for malignant neoplasm of breast: Secondary | ICD-10-CM

## 2015-04-20 ENCOUNTER — Ambulatory Visit: Payer: PRIVATE HEALTH INSURANCE

## 2015-05-10 ENCOUNTER — Ambulatory Visit: Payer: PRIVATE HEALTH INSURANCE

## 2015-05-11 ENCOUNTER — Other Ambulatory Visit: Payer: Self-pay | Admitting: Internal Medicine

## 2015-05-11 DIAGNOSIS — K754 Autoimmune hepatitis: Secondary | ICD-10-CM

## 2015-05-21 ENCOUNTER — Ambulatory Visit: Payer: PRIVATE HEALTH INSURANCE

## 2015-05-27 ENCOUNTER — Ambulatory Visit
Admission: RE | Admit: 2015-05-27 | Discharge: 2015-05-27 | Disposition: A | Discharge status: Home / Self Care | Payer: PRIVATE HEALTH INSURANCE | Source: Ambulatory Visit | Attending: Internal Medicine | Admitting: Internal Medicine

## 2015-05-27 DIAGNOSIS — K754 Autoimmune hepatitis: Secondary | ICD-10-CM

## 2015-06-02 ENCOUNTER — Ambulatory Visit
Admission: RE | Admit: 2015-06-02 | Discharge: 2015-06-02 | Disposition: A | Discharge status: Home / Self Care | Payer: PRIVATE HEALTH INSURANCE | Source: Ambulatory Visit

## 2015-06-02 DIAGNOSIS — Z1231 Encounter for screening mammogram for malignant neoplasm of breast: Secondary | ICD-10-CM

## 2015-06-18 ENCOUNTER — Encounter: Payer: Self-pay | Admitting: Cardiology

## 2015-06-18 ENCOUNTER — Other Ambulatory Visit: Payer: Self-pay

## 2015-06-18 DIAGNOSIS — K754 Autoimmune hepatitis: Secondary | ICD-10-CM

## 2015-06-22 ENCOUNTER — Other Ambulatory Visit (INDEPENDENT_AMBULATORY_CARE_PROVIDER_SITE_OTHER): Payer: PRIVATE HEALTH INSURANCE | Admitting: *Deleted

## 2015-06-22 DIAGNOSIS — K754 Autoimmune hepatitis: Secondary | ICD-10-CM

## 2015-06-22 LAB — COMPREHENSIVE METABOLIC PANEL
ALT: 72 U/L — ABNORMAL HIGH (ref 6–29)
AST: 66 U/L — ABNORMAL HIGH (ref 10–35)
Albumin: 3.8 g/dL (ref 3.6–5.1)
Alkaline Phosphatase: 179 U/L — ABNORMAL HIGH (ref 33–130)
BUN: 21 mg/dL (ref 7–25)
CHLORIDE: 106 mmol/L (ref 98–110)
CO2: 27 mmol/L (ref 20–31)
CREATININE: 0.76 mg/dL (ref 0.50–1.05)
Calcium: 8.6 mg/dL (ref 8.6–10.4)
Glucose, Bld: 99 mg/dL (ref 65–99)
Potassium: 4.4 mmol/L (ref 3.5–5.3)
SODIUM: 139 mmol/L (ref 135–146)
Total Bilirubin: 1.1 mg/dL (ref 0.2–1.2)
Total Protein: 6 g/dL — ABNORMAL LOW (ref 6.1–8.1)

## 2015-06-22 LAB — CBC WITH DIFFERENTIAL/PLATELET
BASOS ABS: 50 {cells}/uL (ref 0–200)
Basophils Relative: 1 %
EOS PCT: 1 %
Eosinophils Absolute: 50 cells/uL (ref 15–500)
HCT: 37.3 % (ref 35.0–45.0)
Hemoglobin: 12.2 g/dL (ref 11.7–15.5)
Lymphocytes Relative: 41 %
Lymphs Abs: 2050 cells/uL (ref 850–3900)
MCH: 30.1 pg (ref 27.0–33.0)
MCHC: 32.7 g/dL (ref 32.0–36.0)
MCV: 92.1 fL (ref 80.0–100.0)
MONOS PCT: 11 %
MPV: 10.5 fL (ref 7.5–12.5)
Monocytes Absolute: 550 cells/uL (ref 200–950)
NEUTROS ABS: 2300 {cells}/uL (ref 1500–7800)
Neutrophils Relative %: 46 %
PLATELETS: 177 10*3/uL (ref 140–400)
RBC: 4.05 MIL/uL (ref 3.80–5.10)
RDW: 13.9 % (ref 11.0–15.0)
WBC: 5 10*3/uL (ref 3.8–10.8)

## 2015-06-23 LAB — CYCLOSPORINE LEVEL: Cyclosporine, Blood: 121 ng/mL — ABNORMAL LOW (ref 140–330)

## 2015-07-14 ENCOUNTER — Encounter: Payer: Self-pay | Admitting: Cardiology

## 2015-07-14 ENCOUNTER — Other Ambulatory Visit: Payer: Self-pay

## 2015-07-14 DIAGNOSIS — K754 Autoimmune hepatitis: Secondary | ICD-10-CM

## 2015-07-15 ENCOUNTER — Other Ambulatory Visit (INDEPENDENT_AMBULATORY_CARE_PROVIDER_SITE_OTHER): Payer: PRIVATE HEALTH INSURANCE | Admitting: *Deleted

## 2015-07-15 DIAGNOSIS — K754 Autoimmune hepatitis: Secondary | ICD-10-CM

## 2015-07-15 LAB — COMPREHENSIVE METABOLIC PANEL
ALBUMIN: 3.9 g/dL (ref 3.6–5.1)
ALK PHOS: 129 U/L (ref 33–130)
ALT: 26 U/L (ref 6–29)
AST: 28 U/L (ref 10–35)
BILIRUBIN TOTAL: 0.9 mg/dL (ref 0.2–1.2)
BUN: 29 mg/dL — ABNORMAL HIGH (ref 7–25)
CALCIUM: 8.6 mg/dL (ref 8.6–10.4)
CO2: 26 mmol/L (ref 20–31)
CREATININE: 0.82 mg/dL (ref 0.50–1.05)
Chloride: 105 mmol/L (ref 98–110)
GLUCOSE: 89 mg/dL (ref 65–99)
Potassium: 4.5 mmol/L (ref 3.5–5.3)
Sodium: 137 mmol/L (ref 135–146)
Total Protein: 6.4 g/dL (ref 6.1–8.1)

## 2015-07-15 LAB — CBC WITH DIFFERENTIAL/PLATELET
BASOS PCT: 1 %
Basophils Absolute: 52 cells/uL (ref 0–200)
EOS PCT: 1 %
Eosinophils Absolute: 52 cells/uL (ref 15–500)
HEMATOCRIT: 37.9 % (ref 35.0–45.0)
HEMOGLOBIN: 12.8 g/dL (ref 11.7–15.5)
LYMPHS ABS: 1664 {cells}/uL (ref 850–3900)
Lymphocytes Relative: 32 %
MCH: 30 pg (ref 27.0–33.0)
MCHC: 33.8 g/dL (ref 32.0–36.0)
MCV: 89 fL (ref 80.0–100.0)
MONOS PCT: 9 %
MPV: 10.8 fL (ref 7.5–12.5)
Monocytes Absolute: 468 cells/uL (ref 200–950)
NEUTROS ABS: 2964 {cells}/uL (ref 1500–7800)
Neutrophils Relative %: 57 %
Platelets: 196 10*3/uL (ref 140–400)
RBC: 4.26 MIL/uL (ref 3.80–5.10)
RDW: 13.8 % (ref 11.0–15.0)
WBC: 5.2 10*3/uL (ref 3.8–10.8)

## 2015-07-15 NOTE — Addendum Note (Signed)
Addended by: Eulis Foster on: 07/15/2015 08:54 AM   Modules accepted: Orders

## 2015-07-16 LAB — CYCLOSPORINE LEVEL: Cyclosporine, Blood: 150 ng/mL (ref 140–330)

## 2015-08-03 ENCOUNTER — Encounter: Payer: Self-pay | Admitting: Cardiology

## 2015-08-03 ENCOUNTER — Other Ambulatory Visit: Payer: Self-pay

## 2015-08-03 DIAGNOSIS — K754 Autoimmune hepatitis: Secondary | ICD-10-CM

## 2015-08-04 ENCOUNTER — Other Ambulatory Visit (INDEPENDENT_AMBULATORY_CARE_PROVIDER_SITE_OTHER): Payer: PRIVATE HEALTH INSURANCE | Admitting: *Deleted

## 2015-08-04 DIAGNOSIS — K754 Autoimmune hepatitis: Secondary | ICD-10-CM | POA: Diagnosis not present

## 2015-08-04 LAB — COMPREHENSIVE METABOLIC PANEL
ALK PHOS: 123 U/L (ref 33–130)
ALT: 32 U/L — AB (ref 6–29)
AST: 28 U/L (ref 10–35)
Albumin: 3.6 g/dL (ref 3.6–5.1)
BUN: 16 mg/dL (ref 7–25)
CHLORIDE: 108 mmol/L (ref 98–110)
CO2: 22 mmol/L (ref 20–31)
Calcium: 8.3 mg/dL — ABNORMAL LOW (ref 8.6–10.4)
Creat: 0.99 mg/dL (ref 0.50–1.05)
GLUCOSE: 119 mg/dL — AB (ref 65–99)
POTASSIUM: 4.3 mmol/L (ref 3.5–5.3)
Sodium: 139 mmol/L (ref 135–146)
Total Bilirubin: 0.9 mg/dL (ref 0.2–1.2)
Total Protein: 6.1 g/dL (ref 6.1–8.1)

## 2015-08-04 LAB — CBC WITH DIFFERENTIAL/PLATELET
BASOS ABS: 0 {cells}/uL (ref 0–200)
Basophils Relative: 0 %
Eosinophils Absolute: 57 cells/uL (ref 15–500)
Eosinophils Relative: 1 %
HEMATOCRIT: 35.7 % (ref 35.0–45.0)
HEMOGLOBIN: 11.9 g/dL (ref 11.7–15.5)
LYMPHS ABS: 1539 {cells}/uL (ref 850–3900)
Lymphocytes Relative: 27 %
MCH: 30.1 pg (ref 27.0–33.0)
MCHC: 33.3 g/dL (ref 32.0–36.0)
MCV: 90.2 fL (ref 80.0–100.0)
MONO ABS: 513 {cells}/uL (ref 200–950)
MPV: 10.8 fL (ref 7.5–12.5)
Monocytes Relative: 9 %
NEUTROS PCT: 63 %
Neutro Abs: 3591 cells/uL (ref 1500–7800)
Platelets: 180 10*3/uL (ref 140–400)
RBC: 3.96 MIL/uL (ref 3.80–5.10)
RDW: 13.6 % (ref 11.0–15.0)
WBC: 5.7 10*3/uL (ref 3.8–10.8)

## 2015-08-04 LAB — CYCLOSPORINE LEVEL: Cyclosporine, Blood: 157 ng/mL (ref 140–330)

## 2015-10-19 ENCOUNTER — Other Ambulatory Visit (HOSPITAL_COMMUNITY): Payer: Self-pay | Admitting: Internal Medicine

## 2015-10-19 DIAGNOSIS — K754 Autoimmune hepatitis: Secondary | ICD-10-CM

## 2015-12-03 ENCOUNTER — Ambulatory Visit (HOSPITAL_COMMUNITY)
Admission: RE | Admit: 2015-12-03 | Discharge: 2015-12-03 | Disposition: A | Discharge status: Home / Self Care | Payer: PRIVATE HEALTH INSURANCE | Source: Ambulatory Visit | Attending: Internal Medicine | Admitting: Internal Medicine

## 2015-12-03 DIAGNOSIS — K754 Autoimmune hepatitis: Secondary | ICD-10-CM

## 2016-10-06 ENCOUNTER — Other Ambulatory Visit (HOSPITAL_COMMUNITY): Payer: Self-pay | Admitting: Gastroenterology

## 2016-10-06 DIAGNOSIS — K754 Autoimmune hepatitis: Secondary | ICD-10-CM

## 2016-10-20 ENCOUNTER — Ambulatory Visit (HOSPITAL_COMMUNITY): Payer: PRIVATE HEALTH INSURANCE

## 2016-10-25 ENCOUNTER — Ambulatory Visit (HOSPITAL_COMMUNITY): Payer: PRIVATE HEALTH INSURANCE

## 2016-11-01 ENCOUNTER — Other Ambulatory Visit: Payer: Self-pay | Admitting: Radiology

## 2016-11-02 ENCOUNTER — Ambulatory Visit (HOSPITAL_COMMUNITY): Payer: PRIVATE HEALTH INSURANCE

## 2016-11-02 ENCOUNTER — Other Ambulatory Visit: Payer: Self-pay | Admitting: Student

## 2016-11-03 ENCOUNTER — Encounter (HOSPITAL_COMMUNITY): Payer: Self-pay

## 2016-11-03 ENCOUNTER — Ambulatory Visit (HOSPITAL_COMMUNITY)
Admission: RE | Admit: 2016-11-03 | Discharge: 2016-11-03 | Disposition: A | Discharge status: Home / Self Care | Payer: PRIVATE HEALTH INSURANCE | Source: Ambulatory Visit | Attending: Gastroenterology | Admitting: Gastroenterology

## 2016-11-03 DIAGNOSIS — K754 Autoimmune hepatitis: Secondary | ICD-10-CM | POA: Diagnosis not present

## 2016-11-03 DIAGNOSIS — Z9889 Other specified postprocedural states: Secondary | ICD-10-CM | POA: Diagnosis not present

## 2016-11-03 DIAGNOSIS — Z8249 Family history of ischemic heart disease and other diseases of the circulatory system: Secondary | ICD-10-CM | POA: Insufficient documentation

## 2016-11-03 DIAGNOSIS — R001 Bradycardia, unspecified: Secondary | ICD-10-CM | POA: Insufficient documentation

## 2016-11-03 DIAGNOSIS — Z801 Family history of malignant neoplasm of trachea, bronchus and lung: Secondary | ICD-10-CM | POA: Diagnosis not present

## 2016-11-03 DIAGNOSIS — Z8379 Family history of other diseases of the digestive system: Secondary | ICD-10-CM | POA: Diagnosis not present

## 2016-11-03 MED ORDER — HYDROCODONE-ACETAMINOPHEN 5-325 MG PO TABS
1.0000 | ORAL_TABLET | ORAL | Status: DC | PRN
Start: 2016-11-03 — End: 2016-11-04

## 2016-11-03 MED ORDER — GELATIN ABSORBABLE 12-7 MM EX MISC
CUTANEOUS | Status: AC
  Filled 2016-11-03: qty 1

## 2016-11-03 MED ORDER — FENTANYL CITRATE (PF) 100 MCG/2ML IJ SOLN
INTRAMUSCULAR | Status: AC
  Filled 2016-11-03: qty 2

## 2016-11-03 MED ORDER — SODIUM CHLORIDE 0.9 % IV SOLN: INTRAVENOUS | Status: DC

## 2016-11-03 MED ORDER — LIDOCAINE HCL (PF) 1 % IJ SOLN
INTRAMUSCULAR | Status: AC
  Filled 2016-11-03: qty 10

## 2016-11-03 MED ORDER — SODIUM CHLORIDE 0.9 % IV SOLN
INTRAVENOUS | Status: AC | PRN
  Administered 2016-11-03: 10 mL/h via INTRAVENOUS

## 2016-11-03 MED ORDER — FENTANYL CITRATE (PF) 100 MCG/2ML IJ SOLN
INTRAMUSCULAR | Status: AC | PRN
  Administered 2016-11-03: 50 ug via INTRAVENOUS

## 2016-11-03 MED ORDER — MIDAZOLAM HCL 2 MG/2ML IJ SOLN
INTRAMUSCULAR | Status: AC
  Filled 2016-11-03: qty 2

## 2016-11-03 MED ORDER — MIDAZOLAM HCL 2 MG/2ML IJ SOLN
INTRAMUSCULAR | Status: AC | PRN
  Administered 2016-11-03 (×2): 1 mg via INTRAVENOUS

## 2016-11-03 NOTE — Sedation Documentation (Signed)
Patient is resting comfortably. 

## 2016-11-03 NOTE — H&P (Signed)
Chief Complaint: autoimmune hepatitis  Referring Physician:Dr. Juanita Craver  Supervising Physician: Jacqulynn Cadet  Patient Status: [facility] - Out-pt  HPI: Barbara Knight is a 51 y.o. female who has a history of autoimmune hepatitis and is followed by [facility] and Dr. Collene Mares.  Recently her LFTs have begun to increase.  She has had 2 prior random liver biopsies in the past.  She is being referred to [facility] for further evaluation.  However, in the interim a repeat random liver biopsy has been requested. She presents today for this procedure with no complaints.   Past Medical History:  Past Medical History:  Diagnosis Date  . Anemia   . Bradycardia 12/16/2014  . Elevated LFTs    hepatitis virus was negative  . Headache(784.0)    migraines  . Hepatitis    autoimmune  . Mitral valvular prolapse    posterior mitral vavlve leaflet with mild MR    Past Surgical History:  Past Surgical History:  Procedure Laterality Date  . DILATATION & CURETTAGE/HYSTEROSCOPY WITH TRUECLEAR N/A 09/13/2012   Procedure: DILATATION & CURETTAGE/HYSTEROSCOPY WITH TRUECLEAR and THERMACHOICE;  Surgeon: Cyril Mourning, MD;  Location: [facility];  Service: Gynecology;  Laterality: N/A;  . DILATATION & CURRETTAGE/HYSTEROSCOPY WITH RESECTOCOPE N/A 09/13/2012   Procedure: [place];  Surgeon: Cyril Mourning, MD;  Location: [facility];  Service: Gynecology;  Laterality: N/A;  . DILATION AND CURETTAGE OF UTERUS    . LIVER BIOPSY     x 2    Family History:  Family History  Problem Relation Age of Onset  . Lung cancer Mother   . Barrett's esophagus Mother   . Hypertension Father     Social History:  reports that she has never smoked. She does not have any smokeless tobacco history on file. She reports that she drinks alcohol. She reports that she does not use drugs.  Allergies: No Known Allergies  Medications: Medications reviewed in epic  Please HPI for pertinent  positives, otherwise complete 10 system ROS negative.  Mallampati Score: MD Evaluation Airway: WNL Heart: WNL Abdomen: WNL Chest/ Lungs: WNL ASA  Classification: 2 Mallampati/Airway Score: One  Physical Exam: BP 117/90   Pulse (!) 50   Temp 98.2 F (36.8 C)   Resp 18   Ht 5\' 7"  (1.702 m)   Wt 158 lb (71.7 kg)   SpO2 100%   BMI 24.75 kg/m  Body mass index is 24.75 kg/m. General: pleasant, WD, WN white female who is laying in bed in NAD HEENT: head is normocephalic, atraumatic.  Sclera are noninjected.  PERRL.  Ears and nose without any masses or lesions.  Mouth is pink and moist Heart: regular, rate, and rhythm.  Normal s1,s2. No obvious murmurs, gallops, or rubs noted.  Palpable radial pulse Lungs: CTAB, no wheezes, rhonchi, or rales noted.  Respiratory effort nonlabored Abd: soft, NT, ND, +BS, no masses, hernias, or organomegaly Psych: A&Ox3 with an appropriate affect.   Labs: Labs reviewed from Dr. Lorie Apley office on 09-26-16.  D/w Dr. Laurence Ferrari and at patient's request, no new labs were drawn.  Her labs 1 month ago were normal and felt no need to repeat  Imaging: No results found.  Assessment/Plan 1. Autoimmune hepatitis  We will plan to proceed with a random liver biopsy today.  Her labs and vitals have been reviewed. Risks and benefits discussed with the patient including, but not limited to bleeding, infection, damage to adjacent structures or low yield requiring additional  tests. All of the patient's questions were answered, patient is agreeable to proceed. Consent signed and in chart.   Thank you for this interesting consult.  I greatly enjoyed meeting Barbara Knight and look forward to participating in their care.  A copy of this report was sent to the requesting provider on this date.  Electronically Signed: Henreitta Cea 11/03/2016, 12:29 PM   I spent a total of  30 Minutes   in face to face in clinical consultation, greater than 50% of which was  counseling/coordinating care for autoimmune hepatitis

## 2016-11-03 NOTE — Procedures (Signed)
Interventional Radiology Procedure Note  Procedure: Random liver biopsy  Complications: None  Estimated Blood Loss: None  Recommendations: - Bedrest x 3 hrs - DC home  Signed,  Criselda Peaches, MD

## 2016-11-03 NOTE — Discharge Instructions (Signed)
Liver Biopsy, Care After °These instructions give you information on caring for yourself after your procedure. Your doctor may also give you more specific instructions. Call your doctor if you have any problems or questions after your procedure. °Follow these instructions at home: °· Rest at home for 1-2 days or as told by your doctor. °· Have someone stay with you for at least 24 hours. °· Do not do these things in the first 24 hours: °? Drive. °? Use machinery. °? Take care of other people. °? Sign legal documents. °? Take a bath or shower. °· There are many different ways to close and cover a cut (incision). For example, a cut can be closed with stitches, skin glue, or adhesive strips. Follow your doctor's instructions on: °? Taking care of your cut. °? Changing and removing your bandage (dressing). °? Removing whatever was used to close your cut. °· Do not drink alcohol in the first week. °· Do not lift more than 5 pounds or play contact sports for the first 2 weeks. °· Take medicines only as told by your doctor. For 1 week, do not take medicine that has aspirin in it or medicines like ibuprofen. °· Get your test results. °Contact a doctor if: °· A cut bleeds and leaves more than just a small spot of blood. °· A cut is red, puffs up (swells), or hurts more than before. °· Fluid or something else comes from a cut. °· A cut smells bad. °· You have a fever or chills. °Get help right away if: °· You have swelling, bloating, or pain in your belly (abdomen). °· You get dizzy or faint. °· You have a rash. °· You feel sick to your stomach (nauseous) or throw up (vomit). °· You have trouble breathing, feel short of breath, or feel faint. °· Your chest hurts. °· You have problems talking or seeing. °· You have trouble balancing or moving your arms or legs. °This information is not intended to replace advice given to you by your health care provider. Make sure you discuss any questions you have with your health care  provider. °Document Released: 10/19/2007 Document Revised: 06/17/2015 Document Reviewed: 03/07/2013 °Elsevier Interactive Patient Education © 2018 [employer]vier Inc[employer] ° °

## 2016-12-11 ENCOUNTER — Other Ambulatory Visit: Payer: PRIVATE HEALTH INSURANCE | Admitting: *Deleted

## 2016-12-11 ENCOUNTER — Telehealth: Payer: Self-pay

## 2016-12-11 DIAGNOSIS — K754 Autoimmune hepatitis: Secondary | ICD-10-CM

## 2016-12-11 NOTE — Telephone Encounter (Signed)
Per Dr. Radford Pax, ordered CMET for Dr. Damita Lack (dx code: K75.4). Patient understands she may come any time before 1700 today to have labs drawn. She was grateful for call.    Results will be sent to Dr. Damita Lack at [place]. Fax: [phone]

## 2016-12-12 LAB — COMPREHENSIVE METABOLIC PANEL
A/G RATIO: 1.8 (ref 1.2–2.2)
ALBUMIN: 4.3 g/dL (ref 3.5–5.5)
ALT: 108 IU/L — ABNORMAL HIGH (ref 0–32)
AST: 77 IU/L — ABNORMAL HIGH (ref 0–40)
Alkaline Phosphatase: 290 IU/L — ABNORMAL HIGH (ref 39–117)
BILIRUBIN TOTAL: 0.6 mg/dL (ref 0.0–1.2)
BUN / CREAT RATIO: 28 — AB (ref 9–23)
BUN: 22 mg/dL (ref 6–24)
CHLORIDE: 101 mmol/L (ref 96–106)
CO2: 25 mmol/L (ref 20–29)
Calcium: 8.9 mg/dL (ref 8.7–10.2)
Creatinine, Ser: 0.8 mg/dL (ref 0.57–1.00)
GFR calc non Af Amer: 84 mL/min/{1.73_m2} (ref >59)
GFR, EST AFRICAN AMERICAN: 97 mL/min/{1.73_m2} (ref >59)
GLUCOSE: 87 mg/dL (ref 65–99)
Globulin, Total: 2.4 g/dL (ref 1.5–4.5)
Potassium: 4.2 mmol/L (ref 3.5–5.2)
Sodium: 140 mmol/L (ref 134–144)
TOTAL PROTEIN: 6.7 g/dL (ref 6.0–8.5)

## 2016-12-20 ENCOUNTER — Telehealth: Payer: Self-pay

## 2016-12-20 ENCOUNTER — Telehealth: Payer: Self-pay | Admitting: Cardiology

## 2016-12-20 DIAGNOSIS — K754 Autoimmune hepatitis: Secondary | ICD-10-CM

## 2016-12-20 NOTE — Telephone Encounter (Signed)
Received message from Dr. Radford Pax to call patient to arrange lab work for [facility].  Left message for patient to call back.

## 2016-12-21 ENCOUNTER — Other Ambulatory Visit: Payer: PRIVATE HEALTH INSURANCE | Admitting: *Deleted

## 2016-12-21 DIAGNOSIS — K754 Autoimmune hepatitis: Secondary | ICD-10-CM

## 2016-12-21 NOTE — Telephone Encounter (Signed)
Scheduled patient for CMET today per Dr. Damita Lack at [facility].  Results will be sent to Dr. Damita Lack at [place]. Fax: [phone]

## 2016-12-22 LAB — COMPREHENSIVE METABOLIC PANEL
A/G RATIO: 1.9 (ref 1.2–2.2)
ALT: 126 IU/L — AB (ref 0–32)
AST: 90 IU/L — AB (ref 0–40)
Albumin: 4.4 g/dL (ref 3.5–5.5)
Alkaline Phosphatase: 377 IU/L — ABNORMAL HIGH (ref 39–117)
BILIRUBIN TOTAL: 0.5 mg/dL (ref 0.0–1.2)
BUN/Creatinine Ratio: 31 — ABNORMAL HIGH (ref 9–23)
BUN: 29 mg/dL — ABNORMAL HIGH (ref 6–24)
CHLORIDE: 102 mmol/L (ref 96–106)
CO2: 23 mmol/L (ref 20–29)
Calcium: 9.3 mg/dL (ref 8.7–10.2)
Creatinine, Ser: 0.95 mg/dL (ref 0.57–1.00)
GFR calc non Af Amer: 69 mL/min/{1.73_m2} (ref >59)
GFR, EST AFRICAN AMERICAN: 79 mL/min/{1.73_m2} (ref >59)
GLOBULIN, TOTAL: 2.3 g/dL (ref 1.5–4.5)
Glucose: 86 mg/dL (ref 65–99)
POTASSIUM: 4.5 mmol/L (ref 3.5–5.2)
SODIUM: 140 mmol/L (ref 134–144)
TOTAL PROTEIN: 6.7 g/dL (ref 6.0–8.5)

## 2016-12-26 NOTE — Telephone Encounter (Signed)
CMET and immunoglobulin panel ordered for Dr. Damita Lack (dx code: K75.4). Patient will come Thursday for labs. She was grateful for call.    Results will be sent to Dr. Damita Lack at [place]. Fax: [phone]

## 2016-12-26 NOTE — Telephone Encounter (Signed)
Pt calling regarding coordinating some things with [place] [facility] call [phone]

## 2016-12-27 NOTE — Progress Notes (Signed)
LFTs continue to increase- please send to he GI MD at [place]

## 2016-12-28 ENCOUNTER — Other Ambulatory Visit: Payer: Self-pay | Admitting: *Deleted

## 2016-12-28 DIAGNOSIS — K754 Autoimmune hepatitis: Secondary | ICD-10-CM

## 2016-12-29 LAB — COMPREHENSIVE METABOLIC PANEL
ALBUMIN: 4.3 g/dL (ref 3.5–5.5)
ALK PHOS: 398 IU/L — AB (ref 39–117)
ALT: 113 IU/L — ABNORMAL HIGH (ref 0–32)
AST: 76 IU/L — ABNORMAL HIGH (ref 0–40)
Albumin/Globulin Ratio: 1.5 (ref 1.2–2.2)
BUN / CREAT RATIO: 30 — AB (ref 9–23)
BUN: 28 mg/dL — AB (ref 6–24)
Bilirubin Total: 0.6 mg/dL (ref 0.0–1.2)
CO2: 26 mmol/L (ref 20–29)
Calcium: 9.4 mg/dL (ref 8.7–10.2)
Chloride: 101 mmol/L (ref 96–106)
Creatinine, Ser: 0.93 mg/dL (ref 0.57–1.00)
GFR, EST AFRICAN AMERICAN: 81 mL/min/{1.73_m2} (ref >59)
GFR, EST NON AFRICAN AMERICAN: 70 mL/min/{1.73_m2} (ref >59)
GLOBULIN, TOTAL: 2.8 g/dL (ref 1.5–4.5)
Glucose: 99 mg/dL (ref 65–99)
Potassium: 4.1 mmol/L (ref 3.5–5.2)
SODIUM: 142 mmol/L (ref 134–144)
TOTAL PROTEIN: 7.1 g/dL (ref 6.0–8.5)

## 2016-12-29 LAB — IGG, IGA, IGM
IGG (IMMUNOGLOBIN G), SERUM: 1328 mg/dL (ref 700–1600)
IgA/Immunoglobulin A, Serum: 176 mg/dL (ref 87–352)
IgM (Immunoglobulin M), Srm: 107 mg/dL (ref 26–217)

## 2017-01-04 ENCOUNTER — Other Ambulatory Visit: Payer: PRIVATE HEALTH INSURANCE | Admitting: *Deleted

## 2017-01-04 ENCOUNTER — Telehealth: Payer: Self-pay

## 2017-01-04 DIAGNOSIS — K754 Autoimmune hepatitis: Secondary | ICD-10-CM

## 2017-01-04 NOTE — Telephone Encounter (Signed)
Scheduled patient for CMET today per Dr. Damita Lack at [facility].  Results will be sent to Dr. Damita Lack at [place]. Fax: [phone]

## 2017-01-05 LAB — COMPREHENSIVE METABOLIC PANEL
ALBUMIN: 4.3 g/dL (ref 3.5–5.5)
ALK PHOS: 367 IU/L — AB (ref 39–117)
ALT: 101 IU/L — ABNORMAL HIGH (ref 0–32)
AST: 74 IU/L — AB (ref 0–40)
Albumin/Globulin Ratio: 1.7 (ref 1.2–2.2)
BILIRUBIN TOTAL: 0.6 mg/dL (ref 0.0–1.2)
BUN / CREAT RATIO: 28 — AB (ref 9–23)
BUN: 24 mg/dL (ref 6–24)
CO2: 26 mmol/L (ref 20–29)
CREATININE: 0.87 mg/dL (ref 0.57–1.00)
Calcium: 8.9 mg/dL (ref 8.7–10.2)
Chloride: 101 mmol/L (ref 96–106)
GFR calc Af Amer: 88 mL/min/{1.73_m2} (ref >59)
GFR calc non Af Amer: 76 mL/min/{1.73_m2} (ref >59)
GLOBULIN, TOTAL: 2.6 g/dL (ref 1.5–4.5)
Glucose: 92 mg/dL (ref 65–99)
POTASSIUM: 4.5 mmol/L (ref 3.5–5.2)
SODIUM: 141 mmol/L (ref 134–144)
Total Protein: 6.9 g/dL (ref 6.0–8.5)

## 2017-01-12 ENCOUNTER — Telehealth: Payer: Self-pay | Admitting: Cardiology

## 2017-01-12 DIAGNOSIS — K754 Autoimmune hepatitis: Secondary | ICD-10-CM

## 2017-01-12 NOTE — Telephone Encounter (Signed)
Returned call to patient, left message to call back

## 2017-01-12 NOTE — Telephone Encounter (Signed)
Follow Up ° ° ° °Returning call from earlier. Please call. °

## 2017-01-17 ENCOUNTER — Other Ambulatory Visit: Payer: Self-pay | Admitting: Cardiology

## 2017-01-17 ENCOUNTER — Other Ambulatory Visit: Payer: PRIVATE HEALTH INSURANCE | Admitting: *Deleted

## 2017-01-17 NOTE — Telephone Encounter (Signed)
Scheduled patient for CMET today per Dr. Damita Lack at [facility]. Results will be sent to Dr. Damita Lack at [place]. Fax: [phone]

## 2017-01-18 LAB — COMPREHENSIVE METABOLIC PANEL
A/G RATIO: 1.6 (ref 1.2–2.2)
ALK PHOS: 363 IU/L — AB (ref 39–117)
ALT: 122 IU/L — AB (ref 0–32)
AST: 86 IU/L — AB (ref 0–40)
Albumin: 4.2 g/dL (ref 3.5–5.5)
BILIRUBIN TOTAL: 0.7 mg/dL (ref 0.0–1.2)
BUN/Creatinine Ratio: 25 — ABNORMAL HIGH (ref 9–23)
BUN: 23 mg/dL (ref 6–24)
CHLORIDE: 104 mmol/L (ref 96–106)
CO2: 22 mmol/L (ref 20–29)
Calcium: 9 mg/dL (ref 8.7–10.2)
Creatinine, Ser: 0.92 mg/dL (ref 0.57–1.00)
GFR calc non Af Amer: 71 mL/min/{1.73_m2} (ref >59)
GFR, EST AFRICAN AMERICAN: 82 mL/min/{1.73_m2} (ref >59)
Globulin, Total: 2.6 g/dL (ref 1.5–4.5)
Glucose: 107 mg/dL — ABNORMAL HIGH (ref 65–99)
POTASSIUM: 4.1 mmol/L (ref 3.5–5.2)
Sodium: 140 mmol/L (ref 134–144)
TOTAL PROTEIN: 6.8 g/dL (ref 6.0–8.5)

## 2017-02-01 ENCOUNTER — Other Ambulatory Visit: Payer: Self-pay | Admitting: Cardiology

## 2017-02-01 ENCOUNTER — Other Ambulatory Visit: Payer: PRIVATE HEALTH INSURANCE | Admitting: *Deleted

## 2017-02-01 ENCOUNTER — Telehealth: Payer: Self-pay | Admitting: Cardiology

## 2017-02-01 NOTE — Telephone Encounter (Signed)
Note not needed 

## 2017-02-02 ENCOUNTER — Telehealth: Payer: Self-pay

## 2017-02-02 DIAGNOSIS — K754 Autoimmune hepatitis: Secondary | ICD-10-CM

## 2017-02-02 LAB — COMPREHENSIVE METABOLIC PANEL
A/G RATIO: 1.4 (ref 1.2–2.2)
ALT: 122 IU/L — AB (ref 0–32)
AST: 78 IU/L — ABNORMAL HIGH (ref 0–40)
Albumin: 4 g/dL (ref 3.5–5.5)
Alkaline Phosphatase: 373 IU/L — ABNORMAL HIGH (ref 39–117)
BILIRUBIN TOTAL: 0.6 mg/dL (ref 0.0–1.2)
BUN/Creatinine Ratio: 23 (ref 9–23)
BUN: 19 mg/dL (ref 6–24)
CALCIUM: 9 mg/dL (ref 8.7–10.2)
CHLORIDE: 105 mmol/L (ref 96–106)
CO2: 23 mmol/L (ref 20–29)
Creatinine, Ser: 0.82 mg/dL (ref 0.57–1.00)
GFR calc Af Amer: 94 mL/min/{1.73_m2} (ref >59)
GFR, EST NON AFRICAN AMERICAN: 82 mL/min/{1.73_m2} (ref >59)
GLOBULIN, TOTAL: 2.9 g/dL (ref 1.5–4.5)
Glucose: 104 mg/dL — ABNORMAL HIGH (ref 65–99)
POTASSIUM: 3.9 mmol/L (ref 3.5–5.2)
SODIUM: 142 mmol/L (ref 134–144)
Total Protein: 6.9 g/dL (ref 6.0–8.5)

## 2017-02-02 NOTE — Telephone Encounter (Signed)
Scheduled patient for CMET today per Dr. Damita Lack at [facility]. Results will be sent to Dr. Damita Lack at [place]. Fax: [phone]

## 2017-02-14 ENCOUNTER — Telehealth: Payer: Self-pay

## 2017-02-14 DIAGNOSIS — K754 Autoimmune hepatitis: Secondary | ICD-10-CM

## 2017-02-14 NOTE — Telephone Encounter (Signed)
Scheduled patient for CMET and cyclosporine trough tomorrow per Dr. Damita Lack at [facility]. Results will be sent to Dr. Damita Lack at [place]. Fax: [phone]

## 2017-02-15 ENCOUNTER — Other Ambulatory Visit: Payer: PRIVATE HEALTH INSURANCE

## 2017-02-15 DIAGNOSIS — K754 Autoimmune hepatitis: Secondary | ICD-10-CM

## 2017-02-17 LAB — COMPREHENSIVE METABOLIC PANEL
ALBUMIN: 4.4 g/dL (ref 3.5–5.5)
ALT: 74 IU/L — AB (ref 0–32)
AST: 51 IU/L — ABNORMAL HIGH (ref 0–40)
Albumin/Globulin Ratio: 1.6 (ref 1.2–2.2)
Alkaline Phosphatase: 319 IU/L — ABNORMAL HIGH (ref 39–117)
BUN / CREAT RATIO: 21 (ref 9–23)
BUN: 18 mg/dL (ref 6–24)
Bilirubin Total: 0.6 mg/dL (ref 0.0–1.2)
CALCIUM: 9 mg/dL (ref 8.7–10.2)
CO2: 23 mmol/L (ref 20–29)
CREATININE: 0.85 mg/dL (ref 0.57–1.00)
Chloride: 103 mmol/L (ref 96–106)
GFR calc non Af Amer: 78 mL/min/{1.73_m2} (ref >59)
GFR, EST AFRICAN AMERICAN: 90 mL/min/{1.73_m2} (ref >59)
GLUCOSE: 97 mg/dL (ref 65–99)
Globulin, Total: 2.7 g/dL (ref 1.5–4.5)
Potassium: 4.6 mmol/L (ref 3.5–5.2)
Sodium: 140 mmol/L (ref 134–144)
TOTAL PROTEIN: 7.1 g/dL (ref 6.0–8.5)

## 2017-02-17 LAB — CYCLOSPORINE: Cyclosporine, LC/MS: 29 ng/mL — ABNORMAL LOW (ref 100–400)

## 2017-03-15 ENCOUNTER — Telehealth: Payer: Self-pay

## 2017-03-15 NOTE — Telephone Encounter (Signed)
Per Dr. Radford Pax, scheduled CMET for tomorrow. Results will be sent to [facility].

## 2017-03-16 ENCOUNTER — Other Ambulatory Visit: Payer: PRIVATE HEALTH INSURANCE | Admitting: *Deleted

## 2017-03-16 DIAGNOSIS — K754 Autoimmune hepatitis: Secondary | ICD-10-CM

## 2017-03-16 LAB — COMPREHENSIVE METABOLIC PANEL
ALBUMIN: 4.1 g/dL (ref 3.5–5.5)
ALT: 73 IU/L — ABNORMAL HIGH (ref 0–32)
AST: 55 IU/L — ABNORMAL HIGH (ref 0–40)
Albumin/Globulin Ratio: 1.5 (ref 1.2–2.2)
Alkaline Phosphatase: 296 IU/L — ABNORMAL HIGH (ref 39–117)
BUN / CREAT RATIO: 23 (ref 9–23)
BUN: 18 mg/dL (ref 6–24)
Bilirubin Total: 0.7 mg/dL (ref 0.0–1.2)
CALCIUM: 8.6 mg/dL — AB (ref 8.7–10.2)
CO2: 24 mmol/L (ref 20–29)
CREATININE: 0.77 mg/dL (ref 0.57–1.00)
Chloride: 103 mmol/L (ref 96–106)
GFR, EST AFRICAN AMERICAN: 102 mL/min/{1.73_m2} (ref >59)
GFR, EST NON AFRICAN AMERICAN: 88 mL/min/{1.73_m2} (ref >59)
GLOBULIN, TOTAL: 2.7 g/dL (ref 1.5–4.5)
Glucose: 88 mg/dL (ref 65–99)
Potassium: 4.2 mmol/L (ref 3.5–5.2)
SODIUM: 141 mmol/L (ref 134–144)
TOTAL PROTEIN: 6.8 g/dL (ref 6.0–8.5)

## 2017-07-16 ENCOUNTER — Other Ambulatory Visit: Payer: PRIVATE HEALTH INSURANCE | Admitting: *Deleted

## 2017-07-16 ENCOUNTER — Other Ambulatory Visit: Payer: Self-pay

## 2017-07-16 DIAGNOSIS — R945 Abnormal results of liver function studies: Secondary | ICD-10-CM

## 2017-07-16 DIAGNOSIS — R7989 Other specified abnormal findings of blood chemistry: Secondary | ICD-10-CM

## 2017-07-16 NOTE — Progress Notes (Signed)
Okay to order CMET today for Dr. Damita Lack at [facility] per Dr. Radford Pax. Results will be sent to Dr. Damita Lack

## 2017-07-17 LAB — COMPREHENSIVE METABOLIC PANEL
A/G RATIO: 1.5 (ref 1.2–2.2)
ALBUMIN: 4 g/dL (ref 3.5–5.5)
ALK PHOS: 242 IU/L — AB (ref 39–117)
ALT: 67 IU/L — ABNORMAL HIGH (ref 0–32)
AST: 49 IU/L — ABNORMAL HIGH (ref 0–40)
BILIRUBIN TOTAL: 0.7 mg/dL (ref 0.0–1.2)
BUN / CREAT RATIO: 26 — AB (ref 9–23)
BUN: 21 mg/dL (ref 6–24)
CHLORIDE: 104 mmol/L (ref 96–106)
CO2: 23 mmol/L (ref 20–29)
Calcium: 8.7 mg/dL (ref 8.7–10.2)
Creatinine, Ser: 0.81 mg/dL (ref 0.57–1.00)
GFR calc non Af Amer: 83 mL/min/{1.73_m2} (ref >59)
GFR, EST AFRICAN AMERICAN: 96 mL/min/{1.73_m2} (ref >59)
Globulin, Total: 2.7 g/dL (ref 1.5–4.5)
Glucose: 159 mg/dL — ABNORMAL HIGH (ref 65–99)
POTASSIUM: 3.9 mmol/L (ref 3.5–5.2)
Sodium: 141 mmol/L (ref 134–144)
TOTAL PROTEIN: 6.7 g/dL (ref 6.0–8.5)

## 2017-08-20 ENCOUNTER — Other Ambulatory Visit: Payer: Self-pay | Admitting: Obstetrics and Gynecology

## 2017-08-20 DIAGNOSIS — Z1231 Encounter for screening mammogram for malignant neoplasm of breast: Secondary | ICD-10-CM

## 2017-08-22 ENCOUNTER — Ambulatory Visit
Admission: RE | Admit: 2017-08-22 | Discharge: 2017-08-22 | Disposition: A | Discharge status: Home / Self Care | Payer: PRIVATE HEALTH INSURANCE | Source: Ambulatory Visit | Attending: Obstetrics and Gynecology | Admitting: Obstetrics and Gynecology

## 2017-08-22 DIAGNOSIS — Z1231 Encounter for screening mammogram for malignant neoplasm of breast: Secondary | ICD-10-CM

## 2017-10-11 ENCOUNTER — Other Ambulatory Visit: Payer: Self-pay | Admitting: Gastroenterology

## 2017-10-11 DIAGNOSIS — K754 Autoimmune hepatitis: Secondary | ICD-10-CM

## 2017-10-23 ENCOUNTER — Ambulatory Visit (HOSPITAL_COMMUNITY)
Admission: RE | Admit: 2017-10-23 | Discharge: 2017-10-23 | Disposition: A | Discharge status: Home / Self Care | Payer: PRIVATE HEALTH INSURANCE | Source: Ambulatory Visit | Attending: Gastroenterology | Admitting: Gastroenterology

## 2017-10-23 DIAGNOSIS — K754 Autoimmune hepatitis: Secondary | ICD-10-CM | POA: Insufficient documentation

## 2017-10-29 ENCOUNTER — Other Ambulatory Visit: Payer: Self-pay | Admitting: Gastroenterology

## 2017-10-29 DIAGNOSIS — R933 Abnormal findings on diagnostic imaging of other parts of digestive tract: Secondary | ICD-10-CM

## 2017-12-17 ENCOUNTER — Telehealth: Payer: Self-pay | Admitting: Cardiology

## 2017-12-17 NOTE — Telephone Encounter (Signed)
Patient called wanting to schedule appt.  I advised her it has been over three years since she has last been in the office, and would need be referred to [place] as she would be considered a new patient. She asked me to send a note to Dr. Radford Pax, she would see her anyway.

## 2017-12-17 NOTE — Telephone Encounter (Signed)
Spoke with Dr. Radford Pax, it would be fine to schedule without a referral.

## 2017-12-18 NOTE — Telephone Encounter (Signed)
Patient is schedule with Dr. Lajoyce Lauber on 12/5 at 3pm.

## 2017-12-27 ENCOUNTER — Ambulatory Visit: Payer: PRIVATE HEALTH INSURANCE | Admitting: Cardiology

## 2017-12-27 ENCOUNTER — Encounter

## 2017-12-27 ENCOUNTER — Encounter: Payer: Self-pay | Admitting: Cardiology

## 2017-12-27 VITALS — BP 111/75 | HR 55 | Ht 67.0 in | Wt 159.4 lb

## 2017-12-27 DIAGNOSIS — R001 Bradycardia, unspecified: Secondary | ICD-10-CM | POA: Diagnosis not present

## 2017-12-27 DIAGNOSIS — I341 Nonrheumatic mitral (valve) prolapse: Secondary | ICD-10-CM | POA: Diagnosis not present

## 2017-12-27 NOTE — Progress Notes (Signed)
Cardiology Office Note    Date:  12/27/2017   ID:  Johnny Latu, [place] Jan 05, 1966, MRN [id number]  PCP:  [name], MD  Cardiologist:  Fransico Him, MD   Chief Complaint  Patient presents with  . Follow-up    Palpitations and MVP    History of Present Illness:  Barbara Knight is a 52 y.o. female who is being seen today for the evaluation of MVP and bradycardia at the request of [name], MD.  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  She is now here to reestablish care.  She is here today for followup and is doing well.  She denies any chest pain or pressure, SOB, DOE, PND, orthopnea, LE edema or syncope.  Recently she has been having more palpitations with several occasion in which she felt very woozy.  She especially feels these at night when she lays on her left side.  She has not had any syncope. She had been given a dx of autoimmune hepatitis in the past but is now off immunosuppresion as it was not really having any effect on her LFTs.  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    Past Medical History:  Diagnosis Date  . Anemia   . Bradycardia 12/16/2014  . Elevated LFTs    hepatitis virus was negative  . Headache(784.0)    migraines  . Hepatitis    autoimmune  . Mitral valvular prolapse    posterior mitral vavlve leaflet with mild MR    Past Surgical History:  Procedure Laterality Date  . DILATATION & CURETTAGE/HYSTEROSCOPY WITH TRUECLEAR N/A 09/13/2012   Procedure: DILATATION & CURETTAGE/HYSTEROSCOPY WITH TRUECLEAR and THERMACHOICE;  Surgeon: Cyril Mourning, MD;  Location: [facility];  Service: Gynecology;  Laterality: N/A;  . DILATATION & CURRETTAGE/HYSTEROSCOPY WITH RESECTOCOPE N/A 09/13/2012   Procedure: [place];  Surgeon: Cyril Mourning, MD;  Location: [facility];  Service: Gynecology;   Laterality: N/A;  . DILATION AND CURETTAGE OF UTERUS    . LIVER BIOPSY     x 2    Current Medications: Current Meds  Medication Sig  . ibuprofen (ADVIL) 200 MG tablet Take 3 tablets (600 mg total) by mouth every 6 (six) hours as needed for pain.  . Multiple Vitamins-Iron (MULTI-VITAMIN/IRON) TABS Take 1 tablet by mouth daily.  Marland Kitchen spironolactone (ALDACTONE) 100 MG tablet Take 100 mg by mouth daily.  . ursodiol (ACTIGALL) 500 MG tablet Take 500 mg by mouth 2 (two) times daily.    Allergies:   Patient has no known allergies.   Social History   Socioeconomic History  . Marital status: Married    Spouse name: Not on file  . Number of children: Not on file  . Years of education: Not on file  . Highest education level: Not on file  Occupational History  . Not on file  Social Needs  . Financial resource strain: Not on file  . Food insecurity:    Worry: Not on file    Inability: Not on file  . Transportation needs:    Medical: Not on file    Non-medical: Not on file  Tobacco Use  . Smoking status: Never Smoker  . Smokeless tobacco: Never Used  Substance and Sexual Activity  . Alcohol use: Yes    Comment: none 3-4 weeks  .  Drug use: No  . Sexual activity: Not on file  Lifestyle  . Physical activity:    Days per week: Not on file    Minutes per session: Not on file  . Stress: Not on file  Relationships  . Social connections:    Talks on phone: Not on file    Gets together: Not on file    Attends religious service: Not on file    Active member of club or organization: Not on file    Attends meetings of clubs or organizations: Not on file    Relationship status: Not on file  Other Topics Concern  . Not on file  Social History Narrative  . Not on file     Family History:  The patient's family history includes Barrett's esophagus in her mother; Hypertension in her father; Lung cancer in her mother.   ROS:   Please see the history of present illness.    ROS All other  systems reviewed and are negative.  No flowsheet data found.     PHYSICAL EXAM:   VS:  BP 111/75   Pulse (!) 55   Ht 5\' 7"  (1.702 m)   Wt 159 lb 6.4 oz (72.3 kg)   BMI 24.97 kg/m    GEN: Well nourished, well developed, in no acute distress  HEENT: normal  Neck: no JVD, carotid bruits, or masses Cardiac: RRR; no murmurs, rubs, or gallops,no edema.  Intact distal pulses bilaterally.  Respiratory:  clear to auscultation bilaterally, normal work of breathing GI: soft, nontender, nondistended, + BS MS: no deformity or atrophy  Skin: warm and dry, no rash Neuro:  Alert and Oriented x 3, Strength and sensation are intact Psych: euthymic mood, full affect  Wt Readings from Last 3 Encounters:  12/27/17 159 lb 6.4 oz (72.3 kg)  11/03/16 158 lb (71.7 kg)  12/15/14 157 lb (71.2 kg)      Studies/Labs Reviewed:   EKG:  EKG is ordered today.  The ekg ordered today demonstrates sinus bradycardia at 55bpm with no ST changes and normal intervals.  Recent Labs: 07/16/2017: ALT 67; BUN 21; Creatinine, Ser 0.81; Potassium 3.9; Sodium 141   Lipid Panel    Component Value Date/Time   CHOL 188 12/22/2014 0903   TRIG 68 12/22/2014 0903   HDL 93 12/22/2014 0903   CHOLHDL 2.0 12/22/2014 0903   VLDL 14 12/22/2014 0903   LDLCALC 81 12/22/2014 0903    Additional studies/ records that were reviewed today include:  none    ASSESSMENT:    1. Mitral valvular prolapse   2. Bradycardia      PLAN:  In order of problems listed above:  1.   Mitral valve disease - she has MVP of the posterior MV leaflet with mild MR by echo in 2016.  I do not hear a murmur on exam.  Some of her sx that she has been experiencing may be related to MVP syndrome (SOB, palpitations, dizziness).  I will repeat an echo to make sure this has not progressed.  2.  Bradycardia - this is asymptomatic and likely related to being physically well conditioned with daily exercise.  3.  Palpitations - her heart rate has  been irregular at times but at other times is just fast.  She has an apple watch with the telemetry monitoring so I have asked her to wear it at night and make recordings when she feels the palpitations.  If we cannot get any recordings then will consider  a Zio patch.    Medication Adjustments/Labs and Tests Ordered: Current medicines are reviewed at length with the patient today.  Concerns regarding medicines are outlined above.  Medication changes, Labs and Tests ordered today are listed in the Patient Instructions below.  Patient Instructions  Medication Instructions:  Your physician recommends that you continue on your current medications as directed. Please refer to the Current Medication list given to you today.  If you need a refill on your cardiac medications before your next appointment, please call your pharmacy.   Lab work:  If you have labs (blood work) drawn today and your tests are completely normal, you will receive your results only by: Marland Kitchen MyChart Message (if you have MyChart) OR . A paper copy in the mail If you have any lab test that is abnormal or we need to change your treatment, we will call you to review the results.  Test: Your physician has requested that you have an echocardiogram. Echocardiography is a painless test that uses sound waves to create images of your heart. It provides your doctor with information about the size and shape of your heart and how well your heart's chambers and valves are working. This procedure takes approximately one hour. There are no restrictions for this procedure.  Follow-Up: At [facility], you and your health needs are our priority.  As part of our continuing mission to provide you with exceptional heart care, we have created designated Provider Care Teams.  These Care Teams include your primary Cardiologist (physician) and Advanced Practice Providers (APPs -  Physician Assistants and Nurse Practitioners) who all work together to  provide you with the care you need, when you need it. You will need a follow up appointment in 1 years.  Please call our office 2 months in advance to schedule this appointment.  You may see Dr. Radford Pax or one of the following Advanced Practice Providers on your designated Care Team:   [place], PA-C Melina Copa, PA-C . Ermalinda Barrios, PA-C      Signed, Fransico Him, MD  12/27/2017 2:36 PM    [facility] [address], [place], [place]  [address] Phone: [phone]; Fax: [phone]

## 2017-12-27 NOTE — Patient Instructions (Addendum)
Medication Instructions:  Your physician recommends that you continue on your current medications as directed. Please refer to the Current Medication list given to you today.  If you need a refill on your cardiac medications before your next appointment, please call your pharmacy.   Lab work:  If you have labs (blood work) drawn today and your tests are completely normal, you will receive your results only by: Marland Kitchen MyChart Message (if you have MyChart) OR . A paper copy in the mail If you have any lab test that is abnormal or we need to change your treatment, we will call you to review the results.  Test: Your physician has requested that you have an echocardiogram. Echocardiography is a painless test that uses sound waves to create images of your heart. It provides your doctor with information about the size and shape of your heart and how well your heart's chambers and valves are working. This procedure takes approximately one hour. There are no restrictions for this procedure.  Follow-Up: At [facility], you and your health needs are our priority.  As part of our continuing mission to provide you with exceptional heart care, we have created designated Provider Care Teams.  These Care Teams include your primary Cardiologist (physician) and Advanced Practice Providers (APPs -  Physician Assistants and Nurse Practitioners) who all work together to provide you with the care you need, when you need it. You will need a follow up appointment in 1 years.  Please call our office 2 months in advance to schedule this appointment.  You may see Dr. Radford Pax or one of the following Advanced Practice Providers on your designated Care Team:   [place], PA-C Melina Copa, PA-C . Ermalinda Barrios, PA-C

## 2018-01-02 ENCOUNTER — Ambulatory Visit (HOSPITAL_COMMUNITY): Payer: PRIVATE HEALTH INSURANCE | Attending: Cardiology

## 2018-01-02 ENCOUNTER — Other Ambulatory Visit: Payer: Self-pay

## 2018-01-02 DIAGNOSIS — I341 Nonrheumatic mitral (valve) prolapse: Secondary | ICD-10-CM | POA: Insufficient documentation

## 2018-01-04 ENCOUNTER — Telehealth: Payer: Self-pay

## 2018-01-04 NOTE — Telephone Encounter (Signed)
-----   Message from Sueanne Margarita, MD sent at 01/03/2018  3:46 PM EST ----- Normal LVF with mild late systolic prolapse of the MV with mild MR.  Ascending aorta upper limits of normal  dimension at 3.44mm.

## 2018-01-04 NOTE — Telephone Encounter (Signed)
Left message to call back  

## 2018-01-11 NOTE — Telephone Encounter (Signed)
Spoke with the patient, she expressed understanding and had no further questions about her echo results.

## 2019-03-23 NOTE — Progress Notes (Signed)
Cardiology Office Note:    Date:  03/24/2019   ID:  Barbara Knight, DOB 08/14/1965, MRN [id number]  PCP:  Sueanne Margarita, MD  Cardiologist:  No primary care provider on file.    Referring MD: [name], MD   Chief Complaint  Patient presents with  . Follow-up    Bradycardia, MVP    History of Present Illness:    Barbara Knight is a 54 y.o. 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 She is here today for followup and is doing well.  She denies any chest pain or pressure, SOB, DOE, PND, orthopnea, LE edema, dizziness, palpitations or syncope. She is compliant with his meds and is tolerating meds with no SE.  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  She has not had her LFTs done in a while.  Her last 2D echo in 2019 showed normal LVF with trivial MVP and mild MR and borderline enlarged aortic root at 81mm and ascending aorta at 47mm.  She works out consistently but has had problems with maintaining sleep due to hot flashes from menopause.   Past Medical History:  Diagnosis Date  . Anemia   . Bradycardia 12/16/2014  . Elevated LFTs    hepatitis virus was negative  . Headache(784.0)    migraines  . Hepatitis    autoimmune  . Mitral valvular prolapse    posterior mitral vavlve leaflet with mild MR    Past Surgical History:  Procedure Laterality Date  . DILATATION & CURETTAGE/HYSTEROSCOPY WITH TRUECLEAR N/A 09/13/2012   Procedure: DILATATION & CURETTAGE/HYSTEROSCOPY WITH TRUECLEAR and THERMACHOICE;  Surgeon: Cyril Mourning, MD;  Location: [facility];  Service: Gynecology;  Laterality: N/A;  . DILATATION & CURRETTAGE/HYSTEROSCOPY WITH RESECTOCOPE N/A 09/13/2012   Procedure: [place];  Surgeon: Cyril Mourning, MD;  Location: [facility];  Service: Gynecology;  Laterality: N/A;  . DILATION AND CURETTAGE OF  UTERUS    . LIVER BIOPSY     x 2    Current Medications: Current Meds  Medication Sig  . ibuprofen (ADVIL) 200 MG tablet Take 3 tablets (600 mg total) by mouth every 6 (six) hours as needed for pain.  [place] 6.5 MG INST Place 1 tablet vaginally daily.  . Multiple Vitamins-Iron (MULTI-VITAMIN/IRON) TABS Take 1 tablet by mouth daily.  . progesterone (PROMETRIUM) 100 MG capsule Take 100 mg by mouth daily.  . ursodiol (ACTIGALL) 500 MG tablet Take 500 mg by mouth 2 (two) times daily.     Allergies:   Patient has no known allergies.   Social History   Socioeconomic History  . Marital status: Married    Spouse name: Not on file  . Number of children: Not on file  . Years of education: Not on file  . Highest education level: Not on file  Occupational History  . Not on file  Tobacco Use  . Smoking status: Never Smoker  . Smokeless tobacco: Never Used  Substance and Sexual Activity  . Alcohol use: Yes    Comment: none 3-4 weeks  . Drug use: No  . Sexual activity: Not on file  Other Topics Concern  . Not on file  Social History Narrative  . Not on file   Social Determinants of Health   Financial Resource Strain:   . Difficulty of Paying Living Expenses: Not on  file  Food Insecurity:   . Worried About Charity fundraiser in the Last Year: Not on file  . Ran Out of Food in the Last Year: Not on file  Transportation Needs:   . Lack of Transportation (Medical): Not on file  . Lack of Transportation (Non-Medical): Not on file  Physical Activity:   . Days of Exercise per Week: Not on file  . Minutes of Exercise per Session: Not on file  Stress:   . Feeling of Stress : Not on file  Social Connections:   . Frequency of Communication with Friends and Family: Not on file  . Frequency of Social Gatherings with Friends and Family: Not on file  . Attends Religious Services: Not on file  . Active Member of Clubs or Organizations: Not on file  . Attends Archivist  Meetings: Not on file  . Marital Status: Not on file     Family History: The patient's family history includes Barrett's esophagus in her mother; Hypertension in her father; Lung cancer in her mother.  ROS:   Please see the history of present illness.    ROS  All other systems reviewed and negative.   EKGs/Labs/Other Studies Reviewed:    The following studies were reviewed today: EKG  EKG:  EKG is  ordered today.  The ekg ordered today demonstrates NSR with no ST changes  Recent Labs: No results found for requested labs within last 8760 hours.   Recent Lipid Panel    Component Value Date/Time   CHOL 188 12/22/2014 0903   TRIG 68 12/22/2014 0903   HDL 93 12/22/2014 0903   CHOLHDL 2.0 12/22/2014 0903   VLDL 14 12/22/2014 0903   LDLCALC 81 12/22/2014 0903    Physical Exam:    VS:  BP 98/62   Pulse 68   Ht 5\' 7"  (1.702 m)   Wt 162 lb (73.5 kg)   SpO2 96%   BMI 25.37 kg/m     Wt Readings from Last 3 Encounters:  03/24/19 162 lb (73.5 kg)  12/27/17 159 lb 6.4 oz (72.3 kg)  11/03/16 158 lb (71.7 kg)     GEN:  Well nourished, well developed in no acute distress HEENT: Normal NECK: No JVD; No carotid bruits LYMPHATICS: No lymphadenopathy CARDIAC: RRR, no murmurs, rubs, gallops RESPIRATORY:  Clear to auscultation without rales, wheezing or rhonchi  ABDOMEN: Soft, non-tender, non-distended MUSCULOSKELETAL:  No edema; No deformity  SKIN: Warm and dry NEUROLOGIC:  Alert and oriented x 3 PSYCHIATRIC:  Normal affect   ASSESSMENT:    1. Mitral valvular prolapse   2. Dilated aortic root ([place])   3. Bradycardia   4. Hepatitis    PLAN:    In order of problems listed above:  1.  Mitral valve disease -she has a hx of MVP of the posterior MV leaflet with mild MR 2016 -no murmur on exam -2D echo 12/2017 showed normal LVF with mild MVP and mild MR - stable -she is asymptomatic  2.  Dilated aortic root -2D echo 2019 with aortic root dimension 8mm and ascending  aorta 76mm -We discussed that this is upper limits of normal and instructed her to avoid heavy upper body weight lifting.  -repeat 2D echo to reassess aortic dimension and if it appears enlarged then will proceed with Chest CT  -lipids are at goal and BP is well controlled  3.  Bradycardia -this is asymptomatic and likely related to being physically well conditioned with daily exercise. -  HR in the 60's today  4.  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  Concerns regarding medicines are outlined above.  Orders Placed This Encounter  Procedures  . Hepatic function panel  . EKG 12-Lead  . ECHOCARDIOGRAM COMPLETE   No orders of the defined types were placed in this encounter.   Signed, Fransico Him, MD  03/24/2019 1:08 PM    [place]

## 2019-03-24 ENCOUNTER — Other Ambulatory Visit: Payer: Self-pay

## 2019-03-24 ENCOUNTER — Encounter: Payer: Self-pay | Admitting: Cardiology

## 2019-03-24 ENCOUNTER — Ambulatory Visit: Payer: PRIVATE HEALTH INSURANCE | Admitting: Cardiology

## 2019-03-24 VITALS — BP 98/62 | HR 68 | Ht 67.0 in | Wt 162.0 lb

## 2019-03-24 DIAGNOSIS — R001 Bradycardia, unspecified: Secondary | ICD-10-CM

## 2019-03-24 DIAGNOSIS — K759 Inflammatory liver disease, unspecified: Secondary | ICD-10-CM | POA: Diagnosis not present

## 2019-03-24 DIAGNOSIS — I341 Nonrheumatic mitral (valve) prolapse: Secondary | ICD-10-CM | POA: Diagnosis not present

## 2019-03-24 DIAGNOSIS — I77810 Thoracic aortic ectasia: Secondary | ICD-10-CM

## 2019-03-24 NOTE — Patient Instructions (Addendum)
Medication Instructions:  Your physician recommends that you continue on your current medications as directed. Please refer to the Current Medication list given to you today.  *If you need a refill on your cardiac medications before your next appointment, please call your pharmacy*   Lab Work: TODAY: LFTs  If you have labs (blood work) drawn today and your tests are completely normal, you will receive your results only by: Marland Kitchen MyChart Message (if you have MyChart) OR . A paper copy in the mail If you have any lab test that is abnormal or we need to change your treatment, we will call you to review the results.   Testing/Procedures: Your physician has requested that you have an echocardiogram. Echocardiography is a painless test that uses sound waves to create images of your heart. It provides your doctor with information about the size and shape of your heart and how well your heart's chambers and valves are working. This procedure takes approximately one hour. There are no restrictions for this procedure.  Follow-Up: At [facility], you and your health needs are our priority.  As part of our continuing mission to provide you with exceptional heart care, we have created designated Provider Care Teams.  These Care Teams include your primary Cardiologist (physician) and Advanced Practice Providers (APPs -  Physician Assistants and Nurse Practitioners) who all work together to provide you with the care you need, when you need it.  Your next appointment:   1 year(s)  The format for your next appointment:   In Person  Provider:   Fransico Him, MD

## 2019-03-25 LAB — HEPATIC FUNCTION PANEL
ALT: 74 IU/L — ABNORMAL HIGH (ref 0–32)
AST: 66 IU/L — ABNORMAL HIGH (ref 0–40)
Albumin: 4 g/dL (ref 3.8–4.9)
Alkaline Phosphatase: 264 IU/L — ABNORMAL HIGH (ref 39–117)
Bilirubin Total: 0.7 mg/dL (ref 0.0–1.2)
Bilirubin, Direct: 0.33 mg/dL (ref 0.00–0.40)
Total Protein: 7.1 g/dL (ref 6.0–8.5)

## 2019-04-04 ENCOUNTER — Ambulatory Visit (HOSPITAL_COMMUNITY)
Admission: RE | Admit: 2019-04-04 | Discharge: 2019-04-04 | Disposition: A | Discharge status: Home / Self Care | Payer: PRIVATE HEALTH INSURANCE | Source: Ambulatory Visit | Attending: Student | Admitting: Student

## 2019-04-04 ENCOUNTER — Other Ambulatory Visit (HOSPITAL_COMMUNITY): Payer: Self-pay | Admitting: Student

## 2019-04-04 ENCOUNTER — Other Ambulatory Visit: Payer: Self-pay | Admitting: Student

## 2019-04-04 ENCOUNTER — Other Ambulatory Visit: Payer: Self-pay

## 2019-04-04 DIAGNOSIS — E079 Disorder of thyroid, unspecified: Secondary | ICD-10-CM | POA: Insufficient documentation

## 2019-04-07 ENCOUNTER — Ambulatory Visit (HOSPITAL_COMMUNITY): Payer: PRIVATE HEALTH INSURANCE | Attending: Internal Medicine

## 2019-04-07 ENCOUNTER — Other Ambulatory Visit: Payer: Self-pay

## 2019-04-07 DIAGNOSIS — I77810 Thoracic aortic ectasia: Secondary | ICD-10-CM

## 2019-04-07 DIAGNOSIS — K759 Inflammatory liver disease, unspecified: Secondary | ICD-10-CM

## 2019-05-22 ENCOUNTER — Other Ambulatory Visit: Payer: Self-pay | Admitting: Otolaryngology

## 2019-05-22 DIAGNOSIS — E041 Nontoxic single thyroid nodule: Secondary | ICD-10-CM

## 2019-05-30 ENCOUNTER — Other Ambulatory Visit: Payer: Self-pay | Admitting: Otolaryngology

## 2019-05-30 DIAGNOSIS — E041 Nontoxic single thyroid nodule: Secondary | ICD-10-CM

## 2019-06-04 ENCOUNTER — Other Ambulatory Visit (HOSPITAL_COMMUNITY)
Admission: RE | Admit: 2019-06-04 | Discharge: 2019-06-04 | Disposition: A | Discharge status: Home / Self Care | Payer: PRIVATE HEALTH INSURANCE | Source: Ambulatory Visit | Attending: Otolaryngology | Admitting: Otolaryngology

## 2019-06-04 ENCOUNTER — Ambulatory Visit
Admission: RE | Admit: 2019-06-04 | Discharge: 2019-06-04 | Disposition: A | Discharge status: Home / Self Care | Payer: PRIVATE HEALTH INSURANCE | Source: Ambulatory Visit | Attending: Otolaryngology | Admitting: Otolaryngology

## 2019-06-04 DIAGNOSIS — E041 Nontoxic single thyroid nodule: Secondary | ICD-10-CM | POA: Diagnosis not present

## 2019-06-05 LAB — CYTOLOGY - NON PAP

## 2019-06-11 ENCOUNTER — Other Ambulatory Visit: Payer: PRIVATE HEALTH INSURANCE

## 2019-06-23 ENCOUNTER — Encounter (HOSPITAL_COMMUNITY): Payer: Self-pay

## 2019-11-10 ENCOUNTER — Telehealth: Payer: Self-pay | Admitting: *Deleted

## 2019-11-10 NOTE — Telephone Encounter (Signed)
Dr Hilarie Fredrickson notes "11/04/19- I have been contacted by Bonita Quin, PA-C from [place] regarding need for EGD to screen for varices in setting of chronic liver disease with portal hypertension.   Okay to schedule direct in [place]  She will follow with [facility].  Determine if/when she has had colonoscopy -JMP

## 2019-11-10 NOTE — Telephone Encounter (Signed)
I have left a message for patient to call back. 

## 2019-11-13 NOTE — Telephone Encounter (Signed)
Left message for patient to call back  

## 2019-11-14 NOTE — Telephone Encounter (Signed)
Ok, thanks EGD only Please obtain colonoscopy record and scan into our medical record Thanks JMP

## 2019-11-14 NOTE — Telephone Encounter (Signed)
Left message for patient to call back  

## 2019-11-14 NOTE — Telephone Encounter (Signed)
I have placed a note asking previsit to have patient sign a release. We will get colonoscopy once release signed.

## 2019-11-14 NOTE — Telephone Encounter (Signed)
Scheduled EGD for patient she also said she did have a colonoscopy in 2018 maybe with Dr. Collene Mares.

## 2019-11-14 NOTE — Telephone Encounter (Signed)
Patient called back to schedule endoscopy 01/08/20. I called Dr Youlanda Mighty office and confirmed, patient DID have colonoscopy with Dr Collene Mares 10/2016,

## 2020-01-06 ENCOUNTER — Other Ambulatory Visit: Payer: Self-pay | Admitting: Gastroenterology

## 2020-01-07 ENCOUNTER — Other Ambulatory Visit: Payer: Self-pay | Admitting: Gastroenterology

## 2020-01-07 DIAGNOSIS — M79604 Pain in right leg: Secondary | ICD-10-CM

## 2020-01-08 ENCOUNTER — Encounter: Payer: PRIVATE HEALTH INSURANCE | Admitting: Internal Medicine

## 2020-02-09 ENCOUNTER — Encounter (HOSPITAL_COMMUNITY): Payer: Self-pay | Admitting: Physician Assistant

## 2020-02-13 ENCOUNTER — Ambulatory Visit (HOSPITAL_COMMUNITY)
Admission: RE | Admit: 2020-02-13 | Payer: PRIVATE HEALTH INSURANCE | Source: Home / Self Care | Admitting: Gastroenterology

## 2020-02-13 ENCOUNTER — Encounter (HOSPITAL_COMMUNITY): Admission: RE | Payer: Self-pay | Source: Home / Self Care

## 2020-02-13 SURGERY — ESOPHAGOGASTRODUODENOSCOPY (EGD) WITH PROPOFOL
Anesthesia: Monitor Anesthesia Care

## 2020-04-07 ENCOUNTER — Other Ambulatory Visit: Payer: PRIVATE HEALTH INSURANCE | Admitting: *Deleted

## 2020-04-07 ENCOUNTER — Other Ambulatory Visit: Payer: Self-pay

## 2020-04-07 ENCOUNTER — Telehealth: Payer: Self-pay

## 2020-04-07 ENCOUNTER — Ambulatory Visit
Admission: RE | Admit: 2020-04-07 | Discharge: 2020-04-07 | Disposition: A | Discharge status: Home / Self Care | Payer: PRIVATE HEALTH INSURANCE | Source: Ambulatory Visit | Attending: Cardiology | Admitting: Cardiology

## 2020-04-07 DIAGNOSIS — R0602 Shortness of breath: Secondary | ICD-10-CM

## 2020-04-07 LAB — D-DIMER, QUANTITATIVE: D-DIMER: 0.61 mg/L FEU — ABNORMAL HIGH (ref 0.00–0.49)

## 2020-04-07 NOTE — Telephone Encounter (Signed)
Spoke with the patient and advised her to come to the office today for lab work. Chest X-ray has also been ordered and patient has been advised to go to [place] today to have this done. Patient has been scheduled for soonest available echocardiogram on 3/21.  Appointment for Dr. Radford Pax has been made for 3/24. Patient verbalized understanding and is in agreement with the planned testing.

## 2020-04-07 NOTE — Telephone Encounter (Signed)
-----   Message from Sueanne Margarita, MD sent at 04/07/2020 12:40 PM EDT ----- Barbara Knight is a patient of mine and I have not seen her for a while.    She had COVID 19 back in January and has had persistent cough and now more SOB.  I need to get her in today for labs>>CBC, CMET, TSH and stat ddimer.  I also want her worked in this week for 2D echo with LV strain - post COVID/SOB.  Also get a chest xray for SOB.   Work her in with me in office next week for followup  Thank you  Traci

## 2020-04-08 LAB — CBC
Hematocrit: 40.4 % (ref 34.0–46.6)
Hemoglobin: 13.5 g/dL (ref 11.1–15.9)
MCH: 29.7 pg (ref 26.6–33.0)
MCHC: 33.4 g/dL (ref 31.5–35.7)
MCV: 89 fL (ref 79–97)
Platelets: 173 10*3/uL (ref 150–450)
RBC: 4.54 x10E6/uL (ref 3.77–5.28)
RDW: 13.3 % (ref 11.7–15.4)
WBC: 6.4 10*3/uL (ref 3.4–10.8)

## 2020-04-08 LAB — COMPREHENSIVE METABOLIC PANEL
ALT: 30 IU/L (ref 0–32)
AST: 32 IU/L (ref 0–40)
Albumin/Globulin Ratio: 1.7 (ref 1.2–2.2)
Albumin: 4.3 g/dL (ref 3.8–4.9)
Alkaline Phosphatase: 139 IU/L — ABNORMAL HIGH (ref 44–121)
BUN/Creatinine Ratio: 17 (ref 9–23)
BUN: 19 mg/dL (ref 6–24)
Bilirubin Total: 0.7 mg/dL (ref 0.0–1.2)
CO2: 21 mmol/L (ref 20–29)
Calcium: 9.1 mg/dL (ref 8.7–10.2)
Chloride: 103 mmol/L (ref 96–106)
Creatinine, Ser: 1.11 mg/dL — ABNORMAL HIGH (ref 0.57–1.00)
Globulin, Total: 2.5 g/dL (ref 1.5–4.5)
Glucose: 88 mg/dL (ref 65–99)
Potassium: 4.5 mmol/L (ref 3.5–5.2)
Sodium: 141 mmol/L (ref 134–144)
Total Protein: 6.8 g/dL (ref 6.0–8.5)
eGFR: 58 mL/min/{1.73_m2} — ABNORMAL LOW (ref >59)

## 2020-04-08 LAB — TSH: TSH: 2.35 u[IU]/mL (ref 0.450–4.500)

## 2020-04-12 ENCOUNTER — Ambulatory Visit (HOSPITAL_COMMUNITY): Payer: PRIVATE HEALTH INSURANCE | Attending: Cardiovascular Disease

## 2020-04-12 ENCOUNTER — Other Ambulatory Visit: Payer: Self-pay

## 2020-04-12 DIAGNOSIS — R0602 Shortness of breath: Secondary | ICD-10-CM | POA: Insufficient documentation

## 2020-04-12 LAB — ECHOCARDIOGRAM COMPLETE
Area-P 1/2: 2.42 cm2
S' Lateral: 1.67 cm

## 2020-04-14 ENCOUNTER — Encounter: Payer: Self-pay | Admitting: Cardiology

## 2020-04-15 ENCOUNTER — Other Ambulatory Visit: Payer: PRIVATE HEALTH INSURANCE | Admitting: Cardiology

## 2020-04-15 ENCOUNTER — Ambulatory Visit: Payer: PRIVATE HEALTH INSURANCE | Admitting: Cardiology

## 2020-04-15 ENCOUNTER — Telehealth: Payer: Self-pay

## 2020-04-15 ENCOUNTER — Other Ambulatory Visit (HOSPITAL_COMMUNITY): Payer: Self-pay | Admitting: Emergency Medicine

## 2020-04-15 DIAGNOSIS — I77810 Thoracic aortic ectasia: Secondary | ICD-10-CM

## 2020-04-15 NOTE — Addendum Note (Signed)
Addended by: Antonieta Iba on: 04/15/2020 02:39 PM   Modules accepted: Orders

## 2020-04-15 NOTE — Progress Notes (Signed)
Order placed per Dr. Radford Pax :  Please order a gated Chest/Abdominal/pelvic CTA to assess dilated aorta noted on echo with progressive increase in size and also see if they can do a coronary Calcium score as well. I would like this done ASAP   Please schedule CT C/A/P (gated) with calcium score CT (instruct patient to bring $99 to the appt)  Marchia Bond RN Navigator Cardiac Imaging [facility] [phone] Office  [phone] Cell

## 2020-04-15 NOTE — Telephone Encounter (Signed)
CTA has been ordered as stat and message sent to pre-cert/scheduling.

## 2020-04-15 NOTE — Telephone Encounter (Signed)
-----   Message from Sueanne Margarita, MD sent at 04/14/2020  8:25 PM EDT ----- Discussed results of CT scan with patient.  Her aortic root at the sinuses of Valsalva is dilated at 4cm.  In 2016 it was 3.5cm, in 2019 study 3.8cm, stable at 3.7cm in 2021 and now increased to 4cm.  She informed me that her paternal grandfather had an aortic aneurysm repair and her paternal grandfather's brother died of aortic aneurysm rupture.    Please order a gated Chest/Abdominal/pelvic CTA to assess dilated aorta noted on echo with progressive increase in size and also see if they can do a coronary Calcium score as well.  I would like this done ASAP

## 2020-04-22 ENCOUNTER — Encounter (HOSPITAL_COMMUNITY): Payer: Self-pay

## 2020-04-22 ENCOUNTER — Ambulatory Visit (HOSPITAL_COMMUNITY)
Admission: RE | Admit: 2020-04-22 | Discharge: 2020-04-22 | Disposition: A | Discharge status: Home / Self Care | Payer: PRIVATE HEALTH INSURANCE | Source: Ambulatory Visit | Attending: Cardiology | Admitting: Cardiology

## 2020-04-22 ENCOUNTER — Other Ambulatory Visit: Payer: Self-pay

## 2020-04-22 DIAGNOSIS — R918 Other nonspecific abnormal finding of lung field: Secondary | ICD-10-CM | POA: Insufficient documentation

## 2020-04-22 DIAGNOSIS — K7460 Unspecified cirrhosis of liver: Secondary | ICD-10-CM | POA: Insufficient documentation

## 2020-04-22 DIAGNOSIS — K754 Autoimmune hepatitis: Secondary | ICD-10-CM | POA: Insufficient documentation

## 2020-04-22 DIAGNOSIS — I77810 Thoracic aortic ectasia: Secondary | ICD-10-CM

## 2020-04-22 MED ORDER — IOHEXOL 350 MG/ML SOLN
100.0000 mL | Freq: Once | INTRAVENOUS | Status: AC | PRN
  Administered 2020-04-22: 100 mL via INTRAVENOUS

## 2020-04-23 ENCOUNTER — Telehealth: Payer: Self-pay | Admitting: Internal Medicine

## 2020-04-23 NOTE — Telephone Encounter (Signed)
Patrice/  Dr Valeta Harms reviewed film and think he need not see her now. D/w Dr Radford Pax  -patient having post covid duspnea. Wants me to see patient next few weeks. Please bring her in -30 min slot. Can see earlier than usual or even add on  Thanks  MR

## 2020-04-23 NOTE — Telephone Encounter (Signed)
I agree. But needs follow up imaging in 3-6 months to make sure it resolves   BLI

## 2020-04-23 NOTE — Telephone Encounter (Signed)
Dear Barbara Knight is wife of Dr Deri Fuelling. Dr Fransico Him referred me to her. 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 CT done now shows 2 nodules with larges RUL apex . 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 I d/w Dr Radford Pax 12:45 PM 04/23/2020 over phone. Thought best  A) you see patient first for nodule and later  B) I see for dyspnea/ILD baesd on course with you.  Thanks and LMK - I can ensure Sharl Ma gets patient in with you ASAP or you may.  Dr Radford Pax will communicate plan with patient    MR

## 2020-04-26 ENCOUNTER — Encounter: Payer: Self-pay | Admitting: Cardiology

## 2020-04-26 DIAGNOSIS — Z8249 Family history of ischemic heart disease and other diseases of the circulatory system: Secondary | ICD-10-CM | POA: Insufficient documentation

## 2020-04-26 DIAGNOSIS — I77819 Aortic ectasia, unspecified site: Secondary | ICD-10-CM | POA: Insufficient documentation

## 2020-04-27 ENCOUNTER — Other Ambulatory Visit: Payer: Self-pay

## 2020-04-27 ENCOUNTER — Ambulatory Visit: Payer: PRIVATE HEALTH INSURANCE | Admitting: Cardiology

## 2020-04-27 ENCOUNTER — Encounter: Payer: Self-pay | Admitting: Cardiology

## 2020-04-27 VITALS — BP 100/70 | HR 70 | Ht 67.0 in | Wt 166.8 lb

## 2020-04-27 DIAGNOSIS — I341 Nonrheumatic mitral (valve) prolapse: Secondary | ICD-10-CM

## 2020-04-27 DIAGNOSIS — K759 Inflammatory liver disease, unspecified: Secondary | ICD-10-CM | POA: Diagnosis not present

## 2020-04-27 DIAGNOSIS — I77819 Aortic ectasia, unspecified site: Secondary | ICD-10-CM

## 2020-04-27 NOTE — Patient Instructions (Signed)
Medication Instructions:  Your physician recommends that you continue on your current medications as directed. Please refer to the Current Medication list given to you today.  *If you need a refill on your cardiac medications before your next appointment, please call your pharmacy*   Testing/Procedures: Your physician has requested that you have an echocardiogram in one year. Echocardiography is a painless test that uses sound waves to create images of your heart. It provides your doctor with information about the size and shape of your heart and how well your heart's chambers and valves are working. This procedure takes approximately one hour. There are no restrictions for this procedure.   Follow-Up: At [facility], you and your health needs are our priority.  As part of our continuing mission to provide you with exceptional heart care, we have created designated Provider Care Teams.  These Care Teams include your primary Cardiologist (physician) and Advanced Practice Providers (APPs -  Physician Assistants and Nurse Practitioners) who all work together to provide you with the care you need, when you need it.  Your next appointment:   1 year(s)  The format for your next appointment:   In Person  Provider:   You may see Fransico Him, MD or one of the following Advanced Practice Providers on your designated Care Team:    Melina Copa, PA-C  Ermalinda Barrios, PA-C  Other Instructions You have been referred to see the genetic counselor

## 2020-04-27 NOTE — Telephone Encounter (Signed)
Petry/Emily  Could you please arrange for a visit in May 2022 for the patient with me. New consult  Thanks  MR

## 2020-04-27 NOTE — Progress Notes (Signed)
Cardiology Office Note:    Date:  04/29/2020   ID:  Barbara Knight, DOB Aug 26, 1965, MRN [id number]  PCP:  Barbara Queen, MD  Cardiologist:  No primary care provider on file.    Referring MD: Barbara Queen, MD   Chief Complaint  Patient presents with  . Follow-up    Aortic dilatation, MVP, MR    History of Present Illness:    Barbara Knight is a 55 y.o. female with a hx of asymptomatic bradycardia, autoimmune hepatitis followed at [facility] with dx of primary biliary cholangitis with advanced liver cirrhosis.  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 Her last imaging included a 2D echo 04/12/2020 showing normal LVF (EF65-70%) with normal LV strain, no MVP and trivial MR.  She has also had borderline dilatation of her aortic arch in the past (60mm in 2016>>62mm in 2019>>35mm in 2021) as well as ascending aorta (36>>65mm by serial echo assessment).  She recently called stating that she had had COVID 69 in January and was very sick at the time.  She did not get antibody treatment.  She initially recovered but then starting noticing DOE and exertional fatigue.  She noticed that she was unable to exercise as much as prior to [place] and would get fatigued and SOB more easily as well as feel SOB talking.  She was concerned that it could be her heart.    A 2D echo was ordered to assess LVF after having COVID 19 as well as reassess her MV.  Her echo showed an increase in her aortic root at the sinuses of Valsalva and a family hx of aortic aneurysms (Grandfather had aortic aneurysm repair and his brother died of a ruptured aortic aneurysm), a gated CTA of the chest/abd/pelvie was performed.  The gated CTA showed a coronary Ca score of 0, ectatic mildly dilated ascending aorta at 3.8cm and mildly dilated aortic root at 4cm.  No atherosclerotic plaque was noted. There was also ground glass attenuation nodular opacities in the right lung and non contrast CT was recommended in  followup in 3-6 months.  Of note, she does have a fm hx of adenoCA of the lung in her mom who was not a smoker.    The abdominal and pelvic CTA showed  Cirrhotic liver with multiple arterially enhancing nodules possibly representing regenerating nodules, dysplastic nodules or HPC.  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   She is here today for followup and discussion of her scans.  She denies any chest pain or pressure, PND, orthopnea, LE edema, dizziness, palpitations or syncope. She thinks her SOB has improved some.   I talked with her on the phone when her CT report came back and she tried to contact her hepatologist at [facility] but has not heard back from him.  She is understandibaly anxious about all the findings on CT.   Past Medical History:  Diagnosis Date  . Acquired dilation of ascending aorta and aortic root (HCC)    60mm ascending aorta by CTA and echo 03/2020 and 76mm sinus of valsalva on echo  . Anemia   . Bradycardia 12/16/2014  . Elevated LFTs    hepatitis virus was negative  . Family history of aortic aneurysm    grandfather had aortic aneurysm repair and paternal uncle died of ruptured aortic aneurysm  . Headache(784.0)    migraines  . Hepatitis    autoimmune  . Mitral valvular  prolapse    posterior mitral vavlve leaflet with mild MR    Past Surgical History:  Procedure Laterality Date  . DILATATION & CURETTAGE/HYSTEROSCOPY WITH TRUECLEAR N/A 09/13/2012   Procedure: DILATATION & CURETTAGE/HYSTEROSCOPY WITH TRUECLEAR and THERMACHOICE;  Surgeon: Cyril Mourning, MD;  Location: [place] [facility];  Service: Gynecology;  Laterality: N/A;  . DILATATION & CURRETTAGE/HYSTEROSCOPY WITH RESECTOCOPE N/A 09/13/2012   Procedure: [place];  Surgeon: Cyril Mourning, MD;  Location: [place] [facility];  Service: Gynecology;  Laterality: N/A;  . DILATION AND CURETTAGE OF UTERUS    . LIVER BIOPSY     x 2    Current  Medications: Current Meds  Medication Sig  . ibuprofen (ADVIL) 200 MG tablet Take 3 tablets (600 mg total) by mouth every 6 (six) hours as needed for pain.  [place] 6.5 MG INST Place 1 tablet vaginally daily.  . Multiple Vitamins-Iron (MULTI-VITAMIN/IRON) TABS Take 1 tablet by mouth daily.  . mycophenolate (MYFORTIC) 360 MG TBEC EC tablet Take 720 mg by mouth 2 (two) times daily.  . progesterone (PROMETRIUM) 100 MG capsule Take 100 mg by mouth daily.  . ursodiol (ACTIGALL) 500 MG tablet Take 500 mg by mouth 2 (two) times daily.     Allergies:   Patient has no known allergies.   Social History   Socioeconomic History  . Marital status: Married    Spouse name: Not on file  . Number of children: Not on file  . Years of education: Not on file  . Highest education level: Not on file  Occupational History  . Not on file  Tobacco Use  . Smoking status: Never Smoker  . Smokeless tobacco: Never Used  Substance and Sexual Activity  . Alcohol use: Yes    Comment: none 3-4 weeks  . Drug use: No  . Sexual activity: Not on file  Other Topics Concern  . Not on file  Social History Narrative  . Not on file   Social Determinants of Health   Financial Resource Strain: Not on file  Food Insecurity: Not on file  Transportation Needs: Not on file  Physical Activity: Not on file  Stress: Not on file  Social Connections: Not on file     Family History: The patient's family history includes Aortic aneurysm in her paternal grandfather and paternal uncle; Barrett's esophagus in her mother; Hypertension in her father; Lung cancer in her mother.  ROS:   Please see the history of present illness.    ROS  All other systems reviewed and negative.   EKGs/Labs/Other Studies Reviewed:    The following studies were reviewed today: EKG  2D echo 03/2020 IMPRESSIONS  1. Left ventricular ejection fraction, by estimation, is 60 to 65%. Left  ventricular ejection fraction by 3D volume is 71  %. The left ventricle has  normal function. The left ventricle has no regional wall motion  abnormalities. There is mild left  ventricular hypertrophy. Left ventricular diastolic parameters were  normal. The average left ventricular global longitudinal strain is 19.5 %.  The global longitudinal strain is normal.  2. Right ventricular systolic function is normal. The right ventricular  size is normal. There is normal pulmonary artery systolic pressure.  3. Left atrial size was mildly dilated.  4. The mitral valve is normal in structure. Trivial mitral valve  regurgitation. No evidence of mitral stenosis.  5. The aortic valve is tricuspid. Aortic valve regurgitation is not  visualized. No aortic stenosis is present.  6.  Aortic dilatation noted. There is mild dilatation of the aortic root,  measuring 40 mm.   Gated Chest/ABD/PELVIC CTA 03/2020 IMPRESSION: 1. Ectatic but nonaneurysmal ascending thoracic aorta with a maximal diameter of 3.8 cm. 2. There are 2 ground-glass attenuation nodular opacities in the right lung. The larger measures up to 1.2 cm and contains a central 3 mm solid component. The smaller lesion measures up to 0.7 cm in is positioned within the right lower lobe. Differential considerations include an active infectious/inflammatory process versus low-grade adenocarcinoma. Non-contrast chest CT at 3-6 months is recommended. If nodules persist, subsequent management will be based upon the most suspicious nodule(s). This recommendation follows the consensus statement: Guidelines for Management of Incidental Pulmonary Nodules Detected on CT Images: From the Fleischner Society 2017; Radiology 2017; 284:228-243. 3. Cirrhotic liver containing multiple arterially enhancing nodules. These are incompletely characterized on this single-phase study. Differential considerations include regenerating nodules, dysplastic nodules, and hepatocellular carcinoma. Recommend further  evaluation with gadolinium-enhanced MRI of the abdomen. 4. 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 This could also be further assessed with abdominal MRI. 5. 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  EKG:  EKG is  ordered today.  The ekg ordered today demonstrates NSR with no ST changes  Recent Labs: 04/07/2020: ALT 30; BUN 19; Creatinine, Ser 1.11; Hemoglobin 13.5; Platelets 173; Potassium 4.5; Sodium 141; TSH 2.350   Recent Lipid Panel    Component Value Date/Time   CHOL 188 12/22/2014 0903   TRIG 68 12/22/2014 0903   HDL 93 12/22/2014 0903   CHOLHDL 2.0 12/22/2014 0903   VLDL 14 12/22/2014 0903   LDLCALC 81 12/22/2014 0903    Physical Exam:    VS:  BP 100/70   Pulse 70   Ht 5\' 7"  (1.702 m)   Wt 166 lb 12.8 oz (75.7 kg)   SpO2 97%   BMI 26.12 kg/m     Wt Readings from Last 3 Encounters:  04/27/20 166 lb 12.8 oz (75.7 kg)  03/24/19 162 lb (73.5 kg)  12/27/17 159 lb 6.4 oz (72.3 kg)     GEN: Well nourished, well developed in no acute distress HEENT: Normal NECK: No JVD; No carotid bruits LYMPHATICS: No lymphadenopathy CARDIAC:RRR, no murmurs, rubs, gallops RESPIRATORY:  Clear to auscultation without rales, wheezing or rhonchi  ABDOMEN: Soft, non-tender, non-distended MUSCULOSKELETAL:  No edema; No deformity  SKIN: Warm and dry NEUROLOGIC:  Alert and oriented x 3 PSYCHIATRIC:  Normal affect    ASSESSMENT:    1. Mitral valvular prolapse   2. Acquired dilation of ascending aorta and aortic root ([place])   3. Hepatitis    PLAN:    In order of problems listed above:  1.  Mitral valve disease -she has a hx of MVP of the posterior MV leaflet with mild MR 2016 -no murmur on exam today -2D echo 03/2020 showed normal LVF with no MVP and trivial MR -she is asymptomatic  2.  Dilated aortic root and ascending aorta -2D echo 2019 with aortic root dimension 78mm and ascending aorta 46mm -repeat 2D echo recently showed aortic  root measurement at the SOV 64mm and mildly dilated ascending aorta at 31mm by echo and CT -she has a fm hx of aortic aneurysms>>her grandfather had an aortic aneurysm repair and her GFs brother died of a ruptured aortic aneurysm -I have stressed the importance of avoiding upper body weight lifting -given her family hx I have recommended referral to Genetic counseling -she  will need close followup of her aortic with yearly scanning>>fortunately there is good correlation between CT and echo findings and therefore, to avoid excessive radiation, will follow with yearly echo with Chest CTA or MRI every few years if stable on echo -her BP is well controlled  3.  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 Kathlene Cote today who has done her liver bx at [facility] to discuss scans and order Abdominal MRI to try to delineate the hepatic lesions further as well as the ? SMV/portal vein thrombus -she will try again to get in contact with her hepatologist at [place] with me in 1 year   Medication Adjustments/Labs and Tests Ordered: Current medicines are reviewed at length with the patient today.  Concerns regarding medicines are outlined above.  Orders Placed This Encounter  Procedures  . Lipid panel  . Ambulatory referral to Genetics  . EKG 12-Lead  . ECHOCARDIOGRAM COMPLETE   No orders of the defined types were placed in this encounter.   Signed, Fransico Him, MD  04/29/2020 8:11 PM    [place]

## 2020-04-28 ENCOUNTER — Telehealth: Payer: Self-pay

## 2020-04-28 DIAGNOSIS — K7689 Other specified diseases of liver: Secondary | ICD-10-CM

## 2020-04-28 DIAGNOSIS — K7460 Unspecified cirrhosis of liver: Secondary | ICD-10-CM

## 2020-04-28 DIAGNOSIS — K743 Primary biliary cirrhosis: Secondary | ICD-10-CM

## 2020-04-28 DIAGNOSIS — K759 Inflammatory liver disease, unspecified: Secondary | ICD-10-CM

## 2020-04-28 NOTE — Telephone Encounter (Signed)
-----   Message from Sueanne Margarita, MD sent at 04/28/2020 12:06 PM EDT ----- Please order an abdominal MRI with and without contrast>>please include in order a history of primary biliary cholangitis with cirrhosis now with new liver nodules, arterial enhancing lesions and ? SMV and portal vein partial thrombus.  Needs to be done at [facility]. Please get scheduled ASAP

## 2020-04-28 NOTE — Telephone Encounter (Signed)
No referral has been placed by Dr. Radford Pax - pr

## 2020-04-28 NOTE — Telephone Encounter (Signed)
Called and spoke with pt and was able to get her scheduled for a consult appt with MR 5/12 at 9:30. Patrice, can you see if there is a consult link that needs to be placed with this consult appt and MR, please advise if you want [place] to have the ILD Questionnaire Packet mailed to pt.

## 2020-04-28 NOTE — Telephone Encounter (Signed)
STAT MRI of the abdomen has been ordered.

## 2020-04-29 ENCOUNTER — Ambulatory Visit (HOSPITAL_COMMUNITY): Admission: RE | Admit: 2020-04-29 | Payer: PRIVATE HEALTH INSURANCE | Source: Ambulatory Visit

## 2020-04-29 NOTE — Telephone Encounter (Signed)
All is okay. Pt was scheduled for a consult 5/12. Nothing further needed.

## 2020-04-29 NOTE — Telephone Encounter (Signed)
Dr Radford Pax called me and asked me to consult. Is that not enough?

## 2020-05-18 ENCOUNTER — Other Ambulatory Visit: Payer: PRIVATE HEALTH INSURANCE

## 2020-06-01 ENCOUNTER — Other Ambulatory Visit: Payer: Self-pay

## 2020-06-01 ENCOUNTER — Ambulatory Visit: Payer: PRIVATE HEALTH INSURANCE | Admitting: Genetic Counselor

## 2020-06-02 NOTE — Progress Notes (Signed)
Referring Provider: Fransico Him, MD   Referral Reason Barbara Knight was referred for genetic consult and testing of connective tissue disordes subsequent to imaging studies that noted increasing dilatation of the aortic root and significant family history of aneurysm repair and death from aneurysm rupture.  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 She reports having a history of autoimmune hepatitis, portal hypertension and esophageal varices. She reports having atrial tachycardia about 3-4 years ago and mitral valve prolapse. She underwent imaging studies that first noted an aortic dilatation of 3.5cm in 2016. Serial imaging studies have noted an increase in the aortic dilatation to 3.8 cm in 2019 and 3.7cm in 2021.   Traditional Risk Factors Barbara Knight denies having atherosclerosis, hypertension and prior aortic dissection that can also lead to AoD.  Family history Barbara Knight (III.2) has three sons, ages 71, 68 and 56 (IV.1-3) and a daughter (IV.4), age 97. Her 75 y.o. son has mild cerebral palsy, the other kids are all in good health. Her younger sister, (III.3), age 23 and her three children (IV.5-IV.7) are also in good health. Her mother (II.3) died of an adenocarcinoma at age 41. She notes that there is no history of AoD in her maternal uncle (II.4). Her maternal grandfather (I.10) died of COPD at age 44 and grandmother died of COVID19, 50 days prior to her 100th birthday!  Barbara Knight's father (II.2) is now 55, obese w/ untreated HTN and ASCVD. His younger brother (II.1), age 55 is in good health. Her paternal grandfather (i.8) underwent cardiac imaging for aneurysms after his brother (I.7) died suddenly from a ruptured aneurysm that was detected at autopsy. Her grandfather underwent serial screenings and eventually had an aneurysm repair. She does not know whether he had a thoracic  or abdominal aneurysm and age when this was repaired.  Pre-Test Genetic Consultation Notes  Barbara Knight was counseled on the genetics of syndromes that present with aortic aneurysms and dissections, specifically Marfan syndrome (MFS), Loeys-Dietz syndrome (LDS), Vascular Ehlers-Danlos Syndrome (vEDS) and other non-syndromic familial forms of thoracic aortic aneurysms and dissection (FTAAD). It seems highly likely that she may have inherited this condition from her father. I explained to her that aortopathies are an autosomal dominant condition and hence her father, children and sister have a 50% chance of inheriting this condition and should get screened for aneurysms. She verbalized understanding of this.   I discussed incomplete penetrance associated with this condition i.e. not all individuals harboring a mutation will present clinically, and age-related penetrance where clinical presentation increases with advanced age. I also reviewed variable expression and emphasized that this condition can express at any age at any level of severity in the family. Hence, it is important for her first-degree relatives to undergo CTA screening for aneurysms. She verbalized understanding of this.   We walked through the process of genetic testing.   I explained to him that genetic testing is a probabilistic test dependent upon her age and severity of presentation, presence of risk factors and importantly family history of aortic aneurysms or sudden death in first-degree relatives.  The potential outcomes of genetic testing and subsequent management of at-risk family members were discussed so as to manage expectations-  I explained to her that if a mutation is not identified, then all first-degree relatives should undergo cardiac screening for aortic aneurysms. While nearly 11% of AoD cases are idiopathic, it is likely  that she has inherited this condition.  A negative test result can be due to limitations of the genetic  test. She verbalized understanding of this.  There is also the likelihood of identifying a "Variant of unknown significance". This result means that the variant has not been detected in a statistically significant number of patients and/or functional studies have not been performed to verify its pathogenicity. This VUS can be tested in the family to see if it segregates with disease. If a VUS is found, first-degree relatives should get screened.  If a pathogenic variant is reported, then her first-degree family members can get tested for this variant. If they test positive, it is likely they will develop an aortic aneurysm. In light of variable expression and incomplete penetrance associated with this condition, it is not possible to predict when they will manifest clinically with an aneurysm. It is recommended that family members that test positive for the familial pathogenic variant pursue clinical screening.  Impression  In summary, Barbara Knight presents with aortic root dilatation in the absence of risk factors but has significant family history of aneurysm repair and sudden death amongst her paternal relatives. Genetic testing for the genes implicated in connective tissue disorders is recommended. This test should include the major genes that contribute to LDS, vEDS and FTAAD. The genetic test will help confirm her diagnosis and identify the genetic basis of her disease. Since this is an autosomal dominant condition, a positive test result will help identify first-degree family members that may harbor the mutation and are at risk of developing this condition. Appropriate cardiology follow-up and lifestyle management can then be directed to those genotype-positive family members.   In addition, we discussed the protections afforded by the Genetic Information Non-Discrimination Act (GINA). I explained to her that GINA protects her from losing employment or health insurance based on her genotype. However, these  protections do not cover life insurance and disability. She verbalized understanding of this and informs me that her kids do not have life insurance.  Please note that the patient has not been counseled in this visit on personal, cultural or ethical issues that she may face due to her heart condition.   Plan After a thorough discussion of the risk and benefits of genetic testing for aortopathies, Raynesha states her intent to pursue genetic testing and signed the informed consent form. However, she would like to verify the life insurance details for her kids before she starts the test and informs that she would like to come back later for the blood draw.   Barbara Knight, Ph.D, [facility] Clinical Molecular Geneticist

## 2020-06-03 ENCOUNTER — Ambulatory Visit: Payer: PRIVATE HEALTH INSURANCE | Admitting: Internal Medicine

## 2020-06-03 ENCOUNTER — Other Ambulatory Visit: Payer: Self-pay

## 2020-06-03 ENCOUNTER — Encounter: Payer: Self-pay | Admitting: Internal Medicine

## 2020-06-03 ENCOUNTER — Ambulatory Visit: Payer: PRIVATE HEALTH INSURANCE | Admitting: Genetic Counselor

## 2020-06-03 VITALS — BP 104/80 | HR 76 | Temp 97.6°F | Ht 67.0 in | Wt 166.6 lb

## 2020-06-03 DIAGNOSIS — R918 Other nonspecific abnormal finding of lung field: Secondary | ICD-10-CM | POA: Diagnosis not present

## 2020-06-03 DIAGNOSIS — Z8616 Personal history of COVID-19: Secondary | ICD-10-CM | POA: Diagnosis not present

## 2020-06-03 DIAGNOSIS — R0600 Dyspnea, unspecified: Secondary | ICD-10-CM | POA: Diagnosis not present

## 2020-06-03 DIAGNOSIS — Z79899 Other long term (current) drug therapy: Secondary | ICD-10-CM

## 2020-06-03 DIAGNOSIS — R911 Solitary pulmonary nodule: Secondary | ICD-10-CM

## 2020-06-03 DIAGNOSIS — R0609 Other forms of dyspnea: Secondary | ICD-10-CM

## 2020-06-03 DIAGNOSIS — D84821 Immunodeficiency due to drugs: Secondary | ICD-10-CM

## 2020-06-03 NOTE — Progress Notes (Signed)
OV 06/03/2020  Subjective:  Patient ID: Barbara Knight, female , DOB: 07-01-1965 , age 55 y.o. , MRN: [id number] , ADDRESS: [address] [place] [address] PCP [name], MD Patient Care Team: [name], MD as PCP - General (Obstetrics and Gynecology)  This Provider for this visit: Treatment Team:  Attending Provider: Brand Males, MD    06/03/2020 -   Chief Complaint  Patient presents with  . Consult    Pt is being referred by Dr. Radford Pax due to post-covid dyspnea. States she still has complaints of SOB.     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  She is a patient of Dr. Golden Hurter.  She has been diagnosed with primary biliary cirrhosis and is under care of Dr. Damita Lack  hepatologist at [facility].  Since the summer/fall 2020 when she has been on immunosuppression with mycophenolate mofetil [previously CellCept currently Myfortic] and also on prednisone till recently.  Early January 2022 whole family came down with omicron COVID.  She says she had fever and chills and lot of fatigue.  She was in bed for several days.  After that she had significant dyspnea even while talking.  Since then she is significantly improved but still feels she has residual fatigue and inability to reach peak physical fitness.  She says she does Peloton and reaches heart rate of 140 but she feels that the work capacity is only 80% of previous baseline.  She did have some coronary related CT chest end of March 2022.  I visualized this with her.  This shows right upper lobe nodule groundglass opacity that is greater than 1 cm and a smaller nodule in the same area.  There is also bilateral bibasal atelectasis versus infiltrate consistent with COVID.  She is worried about this and therefore presents for follow-up.  No cough no wheezing.  Of note she grew up in [place] and her mother was being followed for a history nodule that then ended up she got metastatic  adenocarcinoma.   CT Chest data  No results found.    PFT  No flowsheet data found.     has a past medical history of Acquired dilation of ascending aorta and aortic root ([place]), Anemia, Bradycardia (12/16/2014), Elevated LFTs, Family history of aortic aneurysm, Headache(784.0), Hepatitis, and Mitral valvular prolapse.   reports that she has never smoked. She has never used smokeless tobacco.  Past Surgical History:  Procedure Laterality Date  . DILATATION & CURETTAGE/HYSTEROSCOPY WITH TRUECLEAR N/A 09/13/2012   Procedure: DILATATION & CURETTAGE/HYSTEROSCOPY WITH TRUECLEAR and THERMACHOICE;  Surgeon: Cyril Mourning, MD;  Location: [facility];  Service: Gynecology;  Laterality: N/A;  . DILATATION & CURRETTAGE/HYSTEROSCOPY WITH RESECTOCOPE N/A 09/13/2012   Procedure: [place];  Surgeon: Cyril Mourning, MD;  Location: [facility];  Service: Gynecology;  Laterality: N/A;  . DILATION AND CURETTAGE OF UTERUS    . LIVER BIOPSY     x 2    No Known Allergies  Immunization History  Administered Date(s) Administered  . Hepatitis A, Adult 12/13/2017, 10/30/2019  . Influenza,inj,Quad PF,6+ Mos 11/14/2019  . Influenza-Unspecified 11/01/2016, 11/30/2017  . Moderna Sars-Covid-2 Vaccination 04/09/2019, 05/07/2019    Family History  Problem Relation Age of Onset  . Lung cancer Mother   . Barrett's esophagus Mother   . Hypertension Father   . Aortic aneurysm Paternal Grandfather        s/p repair  . Aortic aneurysm  Paternal Uncle        died of ruptured aortic aneurysm     Current Outpatient Medications:  .  INTRAROSA 6.5 MG INST, Place 1 tablet vaginally daily., Disp: , Rfl:  .  Multiple Vitamins-Iron (MULTI-VITAMIN/IRON) TABS, Take 1 tablet by mouth daily., Disp: , Rfl:  .  mycophenolate (MYFORTIC) 360 MG TBEC EC tablet, Take 720 mg by mouth 2 (two) times daily., Disp: , Rfl:  .  progesterone (PROMETRIUM) 100 MG capsule, Take 100 mg by  mouth daily., Disp: , Rfl:  .  ursodiol (ACTIGALL) 500 MG tablet, Take 500 mg by mouth 2 (two) times daily., Disp: , Rfl: 3      Objective:   Vitals:   06/03/20 0946  BP: 104/80  Pulse: 76  Temp: 97.6 F (36.4 C)  TempSrc: Temporal  SpO2: 99%  Weight: 166 lb 9.6 oz (75.6 kg)  Height: 5\' 7"  (1.702 m)    Estimated body mass index is 26.09 kg/m as calculated from the following:   Height as of this encounter: 5\' 7"  (1.702 m).   Weight as of this encounter: 166 lb 9.6 oz (75.6 kg).  @WEIGHTCHANGE @  [employer]   06/03/20 0946  Weight: 166 lb 9.6 oz (75.6 kg)     Physical Exam General: No distress. Looks well Neuro: Alert and Oriented x 3. GCS 15. Speech normal Psych: Pleasant Resp:  Barrel Chest - no.  Wheeze - no, Crackles - no, No overt respiratory distress CVS: Normal heart sounds. Murmurs - no Ext: Stigmata of Connective Tissue Disease - no HEENT: Normal upper airway. PEERL +. No post nasal drip        Assessment:       ICD-10-CM   1. History of 2019 novel coronavirus disease (COVID-19)  Z86.16 CT Chest High Resolution  2. Dyspnea on exertion  R06.00 CT Chest High Resolution  3. Abnormal CT scan of lung  R91.8 CT Chest High Resolution  4. Nodule of upper lobe of right lung  R91.1   5. Immunosuppression due to drug therapy [facility])  S28.315    V76.160        Plan:     Patient Instructions     ICD-10-CM   1. History of 2019 novel coronavirus disease (COVID-19)  Z86.16   2. Dyspnea on exertion  R06.00   3. Abnormal CT scan of lung  R91.8   4. Nodule of upper lobe of right lung  R91.1   5. Immunosuppression due to drug therapy [facility])  [phone]    Z79.899     Glad you are a lot better but still noticed that you have residual gap from peak exercise fitness compared to pre-COVID  Abnormal CT scan of the chest March 2020 to include right upper lobe nodule and bibasal infiltrates/atelectasis  -These findings are likely post COVID versus nonspecific  inflammation  -However we do need to keep an eye on this because you are immunosuppressed in case the represent rare opportunistic infections  Plan - Continue exercise program but monitor pulse ox -Do high-resolution CT chest supine and prone middle of June 2022 [in 4 weeks]  Follow-up - 15-minute telephone visit to discuss test results middle of June 2022     SIGNATURE    Dr. Brand Males, M.D., F.C.C.P,  Pulmonary and Critical Care Medicine Staff Physician, [place] Director - Interstitial Lung Disease  Program  Pulmonary [place] at [place], [place], [address]  Pager: [phone]  [phone], If no answer or between  15:00h - 7:00h: call [phone] Telephone: [phone]  10:20 AM 06/03/2020

## 2020-06-03 NOTE — Patient Instructions (Signed)
ICD-10-CM   1. History of 2019 novel coronavirus disease (COVID-19)  Z86.16   2. Dyspnea on exertion  R06.00   3. Abnormal CT scan of lung  R91.8   4. Nodule of upper lobe of right lung  R91.1   5. Immunosuppression due to drug therapy [facility])  [phone]    Z79.899     Glad you are a lot better but still noticed that you have residual gap from peak exercise fitness compared to pre-COVID  Abnormal CT scan of the chest March 2020 to include right upper lobe nodule and bibasal infiltrates/atelectasis  -These findings are likely post COVID versus nonspecific inflammation  -However we do need to keep an eye on this because you are immunosuppressed in case the represent rare opportunistic infections  Plan - Continue exercise program but monitor pulse ox -Do high-resolution CT chest supine and prone middle of June 2022 [in 4 weeks]  Follow-up - 15-minute telephone visit to discuss test results middle of June 2022

## 2020-07-01 ENCOUNTER — Other Ambulatory Visit: Payer: Self-pay

## 2020-07-01 ENCOUNTER — Ambulatory Visit (INDEPENDENT_AMBULATORY_CARE_PROVIDER_SITE_OTHER)
Admission: RE | Admit: 2020-07-01 | Discharge: 2020-07-01 | Disposition: A | Discharge status: Home / Self Care | Payer: PRIVATE HEALTH INSURANCE | Source: Ambulatory Visit | Attending: Internal Medicine | Admitting: Internal Medicine

## 2020-07-01 DIAGNOSIS — R918 Other nonspecific abnormal finding of lung field: Secondary | ICD-10-CM | POA: Diagnosis not present

## 2020-07-01 DIAGNOSIS — Z8616 Personal history of COVID-19: Secondary | ICD-10-CM

## 2020-07-01 DIAGNOSIS — R0600 Dyspnea, unspecified: Secondary | ICD-10-CM | POA: Diagnosis not present

## 2020-07-01 DIAGNOSIS — R0609 Other forms of dyspnea: Secondary | ICD-10-CM

## 2020-07-04 NOTE — Progress Notes (Signed)
Will discuss during OV 07/06/20  Zzzz IMPRESSION: 1. No evidence of interstitial lung disease. Minimal air trapping can be seen in the setting of small airways disease. 2. Subsolid nodular lesion in the apical segment right upper lobe, stable from 04/22/2020. Given associated bronchiolectasis and mild architectural distortion, lesion may represent scarring. However, malignancy cannot be excluded. CT chest that contrast is recommended in 1 year. This recommendation follows the consensus statement: Guidelines for Management of Small Pulmonary Nodules Detected on CT Images: From the Fleischner Society 2017; Radiology 2017; 284:228-243. 3. Vague 6 mm right lower lobe ground-glass nodule, also stable. Attention on follow-up recommended above. 4. Cirrhosis. 5. Punctate right renal stone. 6. Right thyroid nodule. No follow-up recommended unless clinically warranted. Patient underwent thyroid ultrasound 04/04/2019 and biopsy 06/04/2019. (Ref: J Am Coll Radiol. 2015 Feb;12(2): 143-50). 7.  Aortic atherosclerosis (ICD10-I70.0).   Electronically Signed   By: Lorin Picket M.D.   On: 07/03/2020 13:15

## 2020-07-05 ENCOUNTER — Other Ambulatory Visit: Payer: Self-pay

## 2020-07-06 ENCOUNTER — Ambulatory Visit (INDEPENDENT_AMBULATORY_CARE_PROVIDER_SITE_OTHER): Payer: PRIVATE HEALTH INSURANCE | Admitting: Internal Medicine

## 2020-07-06 ENCOUNTER — Other Ambulatory Visit: Payer: Self-pay

## 2020-07-06 DIAGNOSIS — R911 Solitary pulmonary nodule: Secondary | ICD-10-CM

## 2020-07-06 DIAGNOSIS — Z79899 Other long term (current) drug therapy: Secondary | ICD-10-CM | POA: Diagnosis not present

## 2020-07-06 DIAGNOSIS — D84821 Immunodeficiency due to drugs: Secondary | ICD-10-CM | POA: Diagnosis not present

## 2020-07-06 DIAGNOSIS — Z8616 Personal history of COVID-19: Secondary | ICD-10-CM | POA: Diagnosis not present

## 2020-07-06 NOTE — Progress Notes (Signed)
OV 06/03/2020  Subjective:  Patient ID: Barbara Knight, female , DOB: 07/03/1965 , age 55 y.o. , MRN: [id number] , ADDRESS: [address] [place] [address] PCP Barbara Queen, MD Patient Care Team: Barbara Queen, MD as PCP - General (Obstetrics and Gynecology)  This Provider for this visit: Treatment Team:  Attending Provider: Brand Males, MD    06/03/2020 -   Chief Complaint  Patient presents with   Consult    Pt is being referred by Barbara Knight due to post-covid dyspnea. States she still has complaints of SOB.     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  She has been diagnosed with primary biliary cirrhosis and is under care of Barbara Knight  hepatologist at [facility].  Since the summer/fall 2020 when she has been on immunosuppression with mycophenolate mofetil [previously CellCept currently Myfortic] and also on prednisone till recently.  Early January 2022 whole family came down with omicron COVID.  She says she had fever and chills and lot of fatigue.  She was in bed for several days.  After that she had significant dyspnea even while talking.  Since then she is significantly improved but still feels she has residual fatigue and inability to reach peak physical fitness.  She says she does Peloton and reaches heart rate of 140 but she feels that the work capacity is only 80% of previous baseline.  She did have some coronary related CT chest end of March 2022.  I visualized this with her.  This shows right upper lobe nodule groundglass opacity that is greater than 1 cm and a smaller nodule in the same area.  There is also bilateral bibasal atelectasis versus infiltrate consistent with COVID.  She is worried about this and therefore presents for follow-up.  No cough no wheezing.  Of note she grew up in [place] and her mother was being followed for a history nodule that then ended up she got metastatic  adenocarcinoma.   CT Chest data  No results found.   OV 07/06/2020  Subjective:  Patient ID: Barbara Knight, female , DOB: 03/14/1965 , age 55 y.o. , MRN: [id number] , ADDRESS: [address] [place] [address] PCP Barbara Queen, MD Patient Care Team: Barbara Queen, MD as PCP - General (Obstetrics and Gynecology)  This Provider for this visit: Treatment Team:  Attending Provider: Brand Males, MD  Type of visit: Telephone/Video Circumstance: COVID-19 national emergency Identification of patient Barbara Knight with [birth year] and MRN [id number] - 2 person identifier Risks: Risks, benefits, limitations of telephone visit explained. Patient understood and verbalized agreement to proceed Anyone else on call: just patient Patient location: [phone] - This provider location: [place]   07/06/2020 -  Followup abnormal CT. Feels better. Ding peloton Still not at baseline max capacity and does not think she is better since last visit. Feels work capacity still stuck though no active respiratory isssue. No new problems. CT scna report is below. Currently not on prednisone. But still on mycophenolate. Afebrile. She is awaiting EGD due to cirrhosis. Not yet scheduled. Sees Dr Damita Knight at [facility] for GI. Wants to have EGD done here at [facility]. She is asking if both procedures can be done at same time but first she need so see local GI. Of note, lung bases look clean to my personal visualition     CT Chest data 07/01/20  IMPRESSION: Mediastinum/Nodes: Heterogeneous low-attenuation right thyroid nodule measures 3.7 cm. Mediastinal lymph nodes are not enlarged by CT size criteria. Hilar regions are difficult to definitively evaluate without IV contrast. No axillary adenopathy. Esophagus is grossly unremarkable.   Lungs/Pleura: Mild biapical pleuroparenchymal scarring. Negative for subpleural reticulation, traction bronchiectasis/bronchiolectasis, architectural  distortion or honeycombing. Subsolid nodule in the apical segment right upper lobe measures 10 x 12 mm and has a 3 mm solid component (3/19), as on 04/22/2020. There does appear to be some mild bronchiolectasis and architectural distortion in association. Vague area of nodular ground-glass in the medial right lower lobe appears less nodular, measuring 6 mm (3/72), stable in size. No new pulmonary nodules. No pleural fluid. There is leftward deviation of the trachea secondary to the above described right thyroid nodule. Airway is patent. Minimal air trapping. 1. No evidence of interstitial lung disease. Minimal air trapping can be seen in the setting of small airways disease. 2. Subsolid nodular lesion in the apical segment right upper lobe, stable from 04/22/2020. Given associated bronchiolectasis and mild architectural distortion, lesion may represent scarring. However, malignancy cannot be excluded. CT chest that contrast is recommended in 1 year. This recommendation follows the consensus statement: Guidelines for Management of Small Pulmonary Nodules Detected on CT Images: From the Fleischner Society 2017; Radiology 2017; 284:228-243. 3. Vague 6 mm right lower lobe ground-glass nodule, also stable. Attention on follow-up recommended above. 4. Cirrhosis. 5. Punctate right renal stone. 6. Right thyroid nodule. No follow-up recommended unless clinically warranted. Patient underwent thyroid ultrasound 04/04/2019 and biopsy 06/04/2019. (Ref: J Am Coll Radiol. 2015 Feb;12(2): 143-50). 7.  Aortic atherosclerosis (ICD10-I70.0).     Electronically Signed   By: Barbara Knight M.D.   On: 07/03/2020 13:15  No results found.    PFT  No flowsheet data found.     has a past medical history of Acquired dilation of ascending aorta and aortic root ([place]), Anemia, Bradycardia (12/16/2014), Elevated LFTs, Family history of aortic aneurysm, Headache(784.0), Hepatitis, and Mitral valvular  prolapse.   reports that she has never smoked. She has never used smokeless tobacco.  Past Surgical History:  Procedure Laterality Date   DILATATION & CURETTAGE/HYSTEROSCOPY WITH TRUECLEAR N/A 09/13/2012   Procedure: DILATATION & CURETTAGE/HYSTEROSCOPY WITH TRUECLEAR and THERMACHOICE;  Surgeon: Cyril Mourning, MD;  Location: [facility];  Service: Gynecology;  Laterality: N/A;   DILATATION & CURRETTAGE/HYSTEROSCOPY WITH RESECTOCOPE N/A 09/13/2012   Procedure: [place];  Surgeon: Cyril Mourning, MD;  Location: [facility];  Service: Gynecology;  Laterality: N/A;   DILATION AND CURETTAGE OF UTERUS     LIVER BIOPSY     x 2    No Known Allergies  Immunization History  Administered Date(s) Administered   Hepatitis A, Adult 12/13/2017, 10/30/2019   Influenza,inj,Quad PF,6+ Mos 11/14/2019   Influenza-Unspecified 11/01/2016, 11/30/2017   Moderna Sars-Covid-2 Vaccination 04/09/2019, 05/07/2019    Family History  Problem Relation Age of Onset   Lung cancer Mother    Barrett's esophagus Mother    Hypertension Father    Aortic aneurysm Paternal Grandfather        s/p repair   Aortic aneurysm Paternal Uncle        died of ruptured aortic aneurysm     Current Outpatient Medications:    INTRAROSA 6.5 MG INST, Place 1 tablet vaginally daily., Disp: , Rfl:    Multiple Vitamins-Iron (MULTI-VITAMIN/IRON) TABS, Take 1 tablet by mouth daily., Disp: , Rfl:    mycophenolate (MYFORTIC) 360 MG  TBEC EC tablet, Take 720 mg by mouth 2 (two) times daily., Disp: , Rfl:    progesterone (PROMETRIUM) 100 MG capsule, Take 100 mg by mouth daily., Disp: , Rfl:    ursodiol (ACTIGALL) 500 MG tablet, Take 500 mg by mouth 2 (two) times daily., Disp: , Rfl: 3      Objective:   There were no vitals filed for this visit.  Estimated body mass index is 26.09 kg/m as calculated from the following:   Height as of 06/03/20: 5\' 7"  (1.702 m).   Weight as of 06/03/20: 166 lb 9.6  oz (75.6 kg).  @WEIGHTCHANGE @  There were no vitals filed for this visit.   Physical Exam Telephone visit - sounded normal      Assessment:       ICD-10-CM   1. Nodule of upper lobe of right lung  R91.1     2. History of 2019 novel coronavirus disease (COVID-19)  Z86.16     3. Immunosuppression due to drug therapy [facility])  B56.701    I10.301          Plan:     Patient Instructions     ICD-10-CM   1. Nodule of upper lobe of right lung  R91.1     2. History of 2019 novel coronavirus disease (COVID-19)  Z86.16     3. Immunosuppression due to drug therapy [facility])  T14.388    I75.797      Will d/with Dr Lamonte Sakai or Icard regarding bronch biopsy with cultures versus serial observation  (Telephone visit - Level 03 visit: Estb 21-30 for this visit type which was visit type: telephone visit in total care time and counseling or/and coordination of care by this undersigned MD - Dr Barbara Knight. This includes one or more of the following for care delivered on 07/06/2020 same day: pre-charting, chart review, note writing, documentation discussion of test results, diagnostic or treatment recommendations, prognosis, risks and benefits of management options, instructions, education, compliance or risk-factor reduction. It excludes time spent by the [place] or office staff in the care of the patient. Actual time was 25 min. E&M code is [id number])    SIGNATURE    Dr. Brand Knight, M.D., F.C.C.P,  Pulmonary and Critical Care Medicine Staff Physician, [place] Director - Interstitial Lung Disease  Program  Pulmonary [place] at [place], [place], [address]  Pager: [phone], If no answer or between  15:00h - 7:00h: call [phone] Telephone: [phone]  9:11 AM 07/06/2020

## 2020-07-06 NOTE — Patient Instructions (Addendum)
ICD-10-CM   1. Nodule of upper lobe of right lung  R91.1     2. History of 2019 novel coronavirus disease (COVID-19)  Z86.16     3. Immunosuppression due to drug therapy [facility])  M62.194    F12.527      Will d/with Dr Lamonte Sakai or Icard regarding bronch biopsy with cultures versus serial observation

## 2020-07-08 ENCOUNTER — Other Ambulatory Visit: Payer: Self-pay | Admitting: Cardiology

## 2020-07-08 DIAGNOSIS — I77819 Aortic ectasia, unspecified site: Secondary | ICD-10-CM

## 2020-07-08 DIAGNOSIS — Z8249 Family history of ischemic heart disease and other diseases of the circulatory system: Secondary | ICD-10-CM

## 2020-07-21 ENCOUNTER — Encounter: Payer: Self-pay | Admitting: Internal Medicine

## 2020-07-21 ENCOUNTER — Telehealth: Payer: Self-pay | Admitting: *Deleted

## 2020-07-21 NOTE — Telephone Encounter (Signed)
Patient called back to schedule endoscopy and previsit with our front staff. Appt scheduled for Dr Vena Rua first available procedure day, 09/22/20

## 2020-07-21 NOTE — Telephone Encounter (Signed)
Dr Hilarie Fredrickson has received correspondence from Dr Charlean Sanfilippo with [facility] requesting that we complete endoscopy on this patient locally. Dr Hilarie Fredrickson has asked that we go forward with scheduling endoscopy for patient. I have left a message for patient to call back.   See email below:  Barbara Knight Please reschedule this EGD with me.  Thanks  [employer]. Pyrtle, M.D. Division Chief, [place] [facility] From: Barbara Knight [email or web address]> Sent: Tuesday, July 13, 2020 8:14:58 AM To: Barbara Knight [email or web address]> Subject: Fwd: EGD   *Caution - External email - see footer for warnings*  ---------- Forwarded message --------- From: Charlean Sanfilippo, M.D. [email or web address]> Date: Mon, Jul 12, 2020 at 10:53 PM Subject: EGD To: [email or web address]>  Barbara Knight, Barbara "Aymara Sassi" Female, 55 y.o., 1965-11-28 MRN: [id number]  Barbara Knight  I hope all is well.  I think Benjamine Mola reached out in October about an EGD for Dr. Annette Stable.  She was hoping to get it closer to home. I think it was scheduled and then cancelled when she had COVID.  Your office told her she needs another referral from [place].  Is there a form you need completed?  Thanks,  Burna Cash, MD Professor of Medicine Chief, Division of Gastroenterology Department of Medicine [facility]

## 2020-07-27 ENCOUNTER — Telehealth: Payer: Self-pay | Admitting: Internal Medicine

## 2020-07-27 DIAGNOSIS — R911 Solitary pulmonary nodule: Secondary | ICD-10-CM

## 2020-07-27 NOTE — Telephone Encounter (Signed)
Discussed   Subsolid nodular lesion in the apical segment right upper lobe, stable from 04/22/2020. Given associated bronchiolectasis and mild architectural distortion, lesion may represent scarring. However, malignancy cannot be excluded. CT chest that contrast is recommended in 1 year. This recommendation follows the consensus statement: Guidelines for Management of Small Pulmonary Nodules Detected on CT Images: From the Fleischner Society 2017; Radiology 2017; 284:228-243.  Discussed scan above with both Dr. Baltazar Apo and Dr. June Leap.  Based on the input agreed to take follow-up approach.  However she is immunosuppressed.  Therefore instead of waiting for 1 year we will get a repeat CT chest without contrast super D format in 3-4 months   Plan  - Please inform the patient which allergy did a week or 2 ago over the phone -Get CT chest without contrast super D format in 3-4 months -Can do telephone visit at follow-up

## 2020-07-28 NOTE — Telephone Encounter (Signed)
Called and spoke with pt letting her know the info stated by MR wanted to have CT repeated in 3-4 months and that we would have her schedule a visit after. Pt verbalized understanding. Order has been placed and placed a comment in there that pt will need to have the appt scheduled. Nothing further needed.

## 2020-09-07 ENCOUNTER — Other Ambulatory Visit: Payer: Self-pay

## 2020-09-07 ENCOUNTER — Ambulatory Visit (AMBULATORY_SURGERY_CENTER): Payer: No Typology Code available for payment source

## 2020-09-07 VITALS — Ht 67.0 in | Wt 162.0 lb

## 2020-09-07 DIAGNOSIS — Z8719 Personal history of other diseases of the digestive system: Secondary | ICD-10-CM

## 2020-09-07 NOTE — Progress Notes (Signed)
Patient's pre-visit was done today over the phone with the patient Name,DOB and address verified. Patient denies any allergies to Eggs and Soy. Patient denies any problems with anesthesia/sedation. Patient denies taking diet pills or blood thinners. No home Oxygen. Packet of Prep instructions mailed to patient including a copy of a consent form-pt is aware. Patient understands to call us back with any questions or concerns. Patient is aware of our care-partner policy and [id number] safety protocol.   EMMI education assigned to the patient for the procedure, sent to [facility].   The patient is COVID-19 vaccinated.    Denies Iron in Multivitamin  In starting review of prep instructions, pt asked if he would deal with the varices at [facility].  After confirming with Lelan Pons, I explained that if any were found, they would be treated at the hospital.  Pt did not want to have 2 procedures.  She did not feel it was good use of health care dollars nor did she want anesthesia twice.  Pt offered to appt the soonest with Dr. Hilarie Fredrickson was 10/4 or with Alen Blew on 9/19.  While preferring the MD, she went with the sooner appt.  She understood that procedure would be cancelled and r/s at hospital after her 9/19 appt.  Pt did not want to continue with PV instructions.

## 2020-09-22 ENCOUNTER — Encounter: Payer: PRIVATE HEALTH INSURANCE | Admitting: Internal Medicine

## 2020-09-30 ENCOUNTER — Other Ambulatory Visit: Payer: Self-pay | Admitting: Otolaryngology

## 2020-09-30 DIAGNOSIS — E041 Nontoxic single thyroid nodule: Secondary | ICD-10-CM

## 2020-10-08 ENCOUNTER — Ambulatory Visit (INDEPENDENT_AMBULATORY_CARE_PROVIDER_SITE_OTHER)
Admission: RE | Admit: 2020-10-08 | Discharge: 2020-10-08 | Disposition: A | Discharge status: Home / Self Care | Payer: No Typology Code available for payment source | Source: Ambulatory Visit | Attending: Internal Medicine | Admitting: Internal Medicine

## 2020-10-08 ENCOUNTER — Other Ambulatory Visit: Payer: Self-pay

## 2020-10-08 DIAGNOSIS — R911 Solitary pulmonary nodule: Secondary | ICD-10-CM

## 2020-10-11 ENCOUNTER — Ambulatory Visit: Payer: No Typology Code available for payment source | Admitting: Physician Assistant

## 2020-10-12 ENCOUNTER — Telehealth: Payer: Self-pay

## 2020-10-12 NOTE — Telephone Encounter (Signed)
D.w Dr Valeta Harms - he said he can see patient possibly this week (thi was when I Talked to him yesterday) but 11/03/20 is fine . Will let you guys decide  Patient can cancel appt with me 10/14/20 - I can see someone else  Let patient know  She can also reach me directly later this eveing if needed

## 2020-10-12 NOTE — Telephone Encounter (Signed)
-----   Message from Garner Nash, DO sent at 10/11/2020  5:41 PM EDT ----- Regarding: appt to discuss nodule Triage,   Please scheduled with me to discuss CT results. We need to go over the pros/cons of proceeding with bronchoscopy as well as the risks. Next available nodule slot.   Thanks  BLI

## 2020-10-12 NOTE — Telephone Encounter (Signed)
LMTCB- please make her appt with Dr Valeta Harms for Oct when she calls back. 30 min nodule. Ok per [employer]. If wants to keep appt with MR for 9/22 she can, but he says ok to cancel.

## 2020-10-12 NOTE — Telephone Encounter (Signed)
Looks like patient is scheduled to see Dr. Chase Caller on Thursday 10/14/2020.  Does she need to keep this appointment AND be scheduled with Dr. Valeta Harms as well?  Or cancel this appt and schedule with just Dr. Valeta Harms?    First available with Dr. Valeta Harms  is 11/03/20  Dr. Chase Caller please advise

## 2020-10-13 ENCOUNTER — Ambulatory Visit
Admission: RE | Admit: 2020-10-13 | Discharge: 2020-10-13 | Disposition: A | Discharge status: Home / Self Care | Payer: No Typology Code available for payment source | Source: Ambulatory Visit | Attending: Otolaryngology | Admitting: Otolaryngology

## 2020-10-13 ENCOUNTER — Encounter: Payer: Self-pay | Admitting: Pulmonary Disease

## 2020-10-13 ENCOUNTER — Other Ambulatory Visit: Payer: Self-pay

## 2020-10-13 ENCOUNTER — Other Ambulatory Visit (HOSPITAL_COMMUNITY)
Admission: RE | Admit: 2020-10-13 | Discharge: 2020-10-13 | Disposition: A | Discharge status: Home / Self Care | Payer: No Typology Code available for payment source | Source: Ambulatory Visit | Attending: Otolaryngology | Admitting: Otolaryngology

## 2020-10-13 ENCOUNTER — Ambulatory Visit: Payer: No Typology Code available for payment source | Admitting: Pulmonary Disease

## 2020-10-13 VITALS — BP 118/80 | HR 76 | Temp 98.0°F | Ht 67.0 in | Wt 160.6 lb

## 2020-10-13 DIAGNOSIS — Z789 Other specified health status: Secondary | ICD-10-CM | POA: Diagnosis not present

## 2020-10-13 DIAGNOSIS — R918 Other nonspecific abnormal finding of lung field: Secondary | ICD-10-CM | POA: Diagnosis not present

## 2020-10-13 DIAGNOSIS — E041 Nontoxic single thyroid nodule: Secondary | ICD-10-CM | POA: Insufficient documentation

## 2020-10-13 DIAGNOSIS — Z801 Family history of malignant neoplasm of trachea, bronchus and lung: Secondary | ICD-10-CM | POA: Diagnosis not present

## 2020-10-13 DIAGNOSIS — R911 Solitary pulmonary nodule: Secondary | ICD-10-CM

## 2020-10-13 NOTE — Telephone Encounter (Signed)
Patient called back and was advised of recs from Dr. Chase Caller and told we could cancel her appt for tomorrow and get her scheduled with Dr. Valeta Harms. When looking at his scheduled he has an 11 o'clock opening for today and she was available and lives in [place] and wanted that appointment. Cancelled appt for tomorrow with MR. Nothing further needed at this time.

## 2020-10-13 NOTE — Progress Notes (Addendum)
Synopsis: Referred in September 2022 for lung nodule, Dr. Chase Caller, PCP: By [name], MD  Subjective:   PATIENT ID: Barbara Knight GENDER: female DOB: [birth year], MRN: [id number]  Chief Complaint  Patient presents with   Consult    Per patient, referred for nodule that was located on past 3 months. Denies any current chest discomfort.     This is a 55 year old female, past medical history of autoimmune hepatitis currently on immunosuppression with mycophenolate. He She follows with Dr. Chase Caller in pulmonary clinic was diagnosed with primary biliary cirrhosis followed at [facility].  She had COVID in January 2022.  She had a small groundglass opacity found on CT imaging her mother was diagnosed with metastatic adenocarcinoma.  She had an initial CT scan of the chest in June 2022 which revealed a subsolid nodular lesion within the apex of the right upper lobe.  We made a decision to follow this up short-term.  In September 2022.  This CT scan was completed on 10/08/2020 which showed interval enlargement of the subsolid lesion now measuring 14 x 9 mm.  Patient was referred today to discuss options regarding lung nodule and next steps.     Past Medical History:  Diagnosis Date   Acquired dilation of ascending aorta and aortic root (HCC)    38m ascending aorta by CTA and echo 03/2020 and 430msinus of valsalva on echo   Anemia    Bradycardia 12/16/2014   Elevated LFTs    hepatitis virus was negative   Family history of aortic aneurysm    grandfather had aortic aneurysm repair and paternal uncle died of ruptured aortic aneurysm   Headache(784.0)    migraines   Hepatitis    autoimmune   Mitral valvular prolapse    posterior mitral vavlve leaflet with mild MR   Thyroid disease    Has a thyroid cyst     Family History  Problem Relation Age of Onset   Lung cancer Mother    Barrett's esophagus Mother    Hypertension Father    Aortic aneurysm Paternal Uncle        died  of ruptured aortic aneurysm   Aortic aneurysm Paternal Grandfather        s/p repair   Colon cancer Neg Hx    Colon polyps Neg Hx    Esophageal cancer Neg Hx    Rectal cancer Neg Hx    Stomach cancer Neg Hx      Past Surgical History:  Procedure Laterality Date   DILATATION & CURETTAGE/HYSTEROSCOPY WITH TRUECLEAR N/A 09/13/2012   Procedure: DILATATION & CURETTAGE/HYSTEROSCOPY WITH TRUECLEAR and THERMACHOICE;  Surgeon: MiCyril MourningMD;  [facility];  Service: Gynecology;  Laterality: N/A;   DILATATION & CURRETTAGE/HYSTEROSCOPY WITH RESECTOCOPE N/A 09/13/2012   [place] Surgeon: MiCyril MourningMD;  [facility];  Service: Gynecology;  Laterality: N/A;   DILATION AND CURETTAGE OF UTERUS     LIVER BIOPSY     x 2   UTERINE FIBROID SURGERY      Social History   Socioeconomic History   Marital status: Married    Spouse name: Not on file   Number of children: Not on file   Years of education: Not on file   Highest education level: Not on file  Occupational History   Not on file  Tobacco Use   Smoking status: Never   Smokeless tobacco: Never  Vaping Use   Vaping Use:  Never used  Substance and Sexual Activity   Alcohol use: Not Currently    Comment: stopped 10/21   Drug use: No   Sexual activity: Not on file  Other Topics Concern   Not on file  Social History Narrative   Not on file   Social Determinants of Health   Financial Resource Strain: Not on file  Food Insecurity: Not on file  Transportation Needs: Not on file  Physical Activity: Not on file  Stress: Not on file  Social Connections: Not on file  Intimate Partner Violence: Not on file     No Known Allergies   Outpatient Medications Prior to Visit  Medication Sig Dispense Refill   INTRAROSA 6.5 MG INST Place 1 tablet vaginally daily.     Multiple Vitamins-Iron (MULTI-VITAMIN/IRON) TABS Take 1 tablet by mouth daily.     mycophenolate  (MYFORTIC) 360 MG TBEC EC tablet Take 720 mg by mouth 2 (two) times daily.     progesterone (PROMETRIUM) 100 MG capsule Take 100 mg by mouth daily.     ursodiol (ACTIGALL) 500 MG tablet Take 500 mg by mouth 2 (two) times daily.  3   No facility-administered medications prior to visit.    Review of Systems  Constitutional:  Negative for chills, fever, malaise/fatigue and weight loss.  HENT:  Negative for hearing loss, sore throat and tinnitus.   Eyes:  Negative for blurred vision and double vision.  Respiratory:  Negative for cough, hemoptysis, sputum production, shortness of breath, wheezing and stridor.   Cardiovascular:  Negative for chest pain, palpitations, orthopnea, leg swelling and PND.  Gastrointestinal:  Negative for abdominal pain, constipation, diarrhea, heartburn, nausea and vomiting.  Genitourinary:  Negative for dysuria, hematuria and urgency.  Musculoskeletal:  Negative for joint pain and myalgias.  Skin:  Negative for itching and rash.  Neurological:  Negative for dizziness, tingling, weakness and headaches.  Endo/Heme/Allergies:  Negative for environmental allergies. Does not bruise/bleed easily.  Psychiatric/Behavioral:  Negative for depression. The patient is not nervous/anxious and does not have insomnia.   All other systems reviewed and are negative.   Objective:  Physical Exam Vitals reviewed.  Constitutional:      General: She is not in acute distress.    Appearance: She is well-developed.  HENT:     Head: Normocephalic and atraumatic.  Eyes:     General: No scleral icterus.    Conjunctiva/sclera: Conjunctivae normal.     Pupils: Pupils are equal, round, and reactive to light.  Neck:     Vascular: No JVD.     Trachea: No tracheal deviation.  Cardiovascular:     Rate and Rhythm: Normal rate and regular rhythm.     Heart sounds: Normal heart sounds. No murmur heard. Pulmonary:     Effort: Pulmonary effort is normal. No tachypnea, accessory muscle usage  or respiratory distress.     Breath sounds: No stridor. No wheezing, rhonchi or rales.  Abdominal:     General: Bowel sounds are normal. There is no distension.     Palpations: Abdomen is soft.     Tenderness: There is no abdominal tenderness.  Musculoskeletal:        General: No tenderness.     Cervical back: Neck supple.  Lymphadenopathy:     Cervical: No cervical adenopathy.  Skin:    General: Skin is warm and dry.     Capillary Refill: Capillary refill takes less than 2 seconds.     Findings: No rash.  Neurological:  Mental Status: She is alert and oriented to person, place, and time.  Psychiatric:        Behavior: Behavior normal.     Vitals:   10/13/20 1104  BP: 118/80  Pulse: 76  Temp: 98 F (36.7 C)  TempSrc: Oral  SpO2: 97%  Weight: 160 lb 9.6 oz (72.8 kg)  Height: _0  (1.702 m)   97% on RA BMI Readings from Last 3 Encounters:  10/13/20 25.15 kg/m  09/07/20 25.37 kg/m  06/03/20 26.09 kg/m   Wt Readings from Last 3 Encounters:  10/13/20 160 lb 9.6 oz (72.8 kg)  09/07/20 162 lb (73.5 kg)  06/03/20 166 lb 9.6 oz (75.6 kg)     CBC    Component Value Date/Time   WBC 6.4 04/07/2020 1542   WBC 5.7 08/04/2015 0842   RBC 4.54 04/07/2020 1542   RBC 3.96 08/04/2015 0842   HGB 13.5 04/07/2020 1542   HCT 40.4 04/07/2020 1542   PLT 173 04/07/2020 1542   MCV 89 04/07/2020 1542   MCH 29.7 04/07/2020 1542   MCH 30.1 08/04/2015 0842   MCHC 33.4 04/07/2020 1542   MCHC 33.3 08/04/2015 0842   RDW 13.3 04/07/2020 1542   LYMPHSABS 1,539 08/04/2015 0842   MONOABS 513 08/04/2015 0842   EOSABS 57 08/04/2015 0842   BASOSABS 0 08/04/2015 0842     Chest Imaging:  10/08/2020 CT chest: Patient had a follow-up CT imaging from her June images which are about reveals a slight enlargement in the subsolid component right upper lobe nodule. We reviewed these images today in the office. The patient's images have been independently reviewed by me.    Pulmonary  Functions Testing Results: No flowsheet data found.  FeNO:   Pathology:   Echocardiogram:   Heart Catheterization:     Assessment & Plan:     ICD-10-CM   1. Right upper lobe pulmonary nodule  R91.1 Ambulatory referral to Cardiothoracic Surgery    Pulmonary function test    CANCELED: Pulmonary Function Test    2. Ground glass opacity present on imaging of lung  R91.8 Ambulatory referral to Cardiothoracic Surgery    Pulmonary function test    CANCELED: Pulmonary Function Test    3. Family history of lung cancer  Z80.1    Mother with EGFR Mutated NSCLC, Non-smoker      4. Non-smoker  Z78.9       Discussion:  This is a 55 year old female lifelong non-smoker.  She is a pediatrician by training.  She had a mother with stage IV non-small cell lung cancer, non-smoker which was EGFR mutated.  Patient now herself is immunosuppressed with mycophenolate due to history of primary biliary cirrhosis and autoimmune hepatitis.  She has a subsolid nodule that has shown growth over 6 months.  Plan: Today we discussed the probability of malignancy related to her subsolid nodule. Based on size location and family history carries at least a 30% probability of malignancy using Brock subsolid calculator. We discussed the various options for tissue sampling. With her family history I too am worried about this lesion. I think we can consider robotic assisted bronchoscopy for tissue sampling.  She is agreeable to this plan. Either way I believe the lesion likely needs to come out.  We also discussed the possibility of a single anesthetic event.  As she is rightfully concerned that we could have a nondiagnostic biopsy and still would require watchful waiting. I explained that for a single anesthetic event we would prepare  for tissue biopsy dye marking and a wedge resection if my biopsies in the room were nondiagnostic.  If they were diagnostic of then could plan directly for lobectomy. I will discuss  case with thoracic surgery. I will schedule her PFTs to be completed ASAP. If they cannot be completed in the office please schedule PFTs for hospital. Patient is agreeable to this plan.   Current Outpatient Medications:    INTRAROSA 6.5 MG INST, Place 1 tablet vaginally daily., Disp: , Rfl:    Multiple Vitamins-Iron (MULTI-VITAMIN/IRON) TABS, Take 1 tablet by mouth daily., Disp: , Rfl:    mycophenolate (MYFORTIC) 360 MG TBEC EC tablet, Take 720 mg by mouth 2 (two) times daily., Disp: , Rfl:    progesterone (PROMETRIUM) 100 MG capsule, Take 100 mg by mouth daily., Disp: , Rfl:    ursodiol (ACTIGALL) 500 MG tablet, Take 500 mg by mouth 2 (two) times daily., Disp: , Rfl: 3  I spent 62 minutes dedicated to the care of this patient on the date of this encounter to include pre-visit review of records, face-to-face time with the patient discussing conditions above, post visit ordering of testing, clinical documentation with the electronic health record, making appropriate referrals as documented, and communicating necessary findings to members of the patients care team.   Garner Nash, DO [facility] 10/13/2020 11:08 AM

## 2020-10-13 NOTE — Telephone Encounter (Signed)
ATC patient x2 LMTCB 

## 2020-10-13 NOTE — H&P (View-Only) (Signed)
 Synopsis: Referred in September 2022 for lung nodule, Dr. Ramaswamy, PCP: By Grewal, Michelle, MD  Subjective:   PATIENT ID: Barbara Knight GENDER: female DOB: 04/24/1965, MRN: [id number]  Chief Complaint  Patient presents with   Consult    Per patient, referred for nodule that was located on past 3 months. Denies any current chest discomfort.     This is a 55-year-old female, past medical history of autoimmune hepatitis currently on immunosuppression with mycophenolate. He She follows with Dr. Ramaswamy in pulmonary clinic was diagnosed with primary biliary cirrhosis followed at [facility].  She had COVID in January 2022.  She had a small groundglass opacity found on CT imaging her mother was diagnosed with metastatic adenocarcinoma.  She had an initial CT scan of the chest in June 2022 which revealed a subsolid nodular lesion within the apex of the right upper lobe.  We made a decision to follow this up short-term.  In September 2022.  This CT scan was completed on 10/08/2020 which showed interval enlargement of the subsolid lesion now measuring 14 x 9 mm.  Patient was referred today to discuss options regarding lung nodule and next steps.     Past Medical History:  Diagnosis Date   Acquired dilation of ascending aorta and aortic root (HCC)    38mm ascending aorta by CTA and echo 03/2020 and 40mm sinus of valsalva on echo   Anemia    Bradycardia 12/16/2014   Elevated LFTs    hepatitis virus was negative   Family history of aortic aneurysm    grandfather had aortic aneurysm repair and paternal uncle died of ruptured aortic aneurysm   Headache(784.0)    migraines   Hepatitis    autoimmune   Mitral valvular prolapse    posterior mitral vavlve leaflet with mild MR   Thyroid disease    Has a thyroid cyst     Family History  Problem Relation Age of Onset   Lung cancer Mother    Barrett's esophagus Mother    Hypertension Father    Aortic aneurysm Paternal Uncle        died  of ruptured aortic aneurysm   Aortic aneurysm Paternal Grandfather        s/p repair   Colon cancer Neg Hx    Colon polyps Neg Hx    Esophageal cancer Neg Hx    Rectal cancer Neg Hx    Stomach cancer Neg Hx      Past Surgical History:  Procedure Laterality Date   DILATATION & CURETTAGE/HYSTEROSCOPY WITH TRUECLEAR N/A 09/13/2012   Procedure: DILATATION & CURETTAGE/HYSTEROSCOPY WITH TRUECLEAR and THERMACHOICE;  Surgeon: Michelle L Grewal, MD;  Location: [facility];  Service: Gynecology;  Laterality: N/A;   DILATATION & CURRETTAGE/HYSTEROSCOPY WITH RESECTOCOPE N/A 09/13/2012   Procedure: DILATATION & CURETTAGE/HYSTEROSCOPY WITH RESECTOCOPE;  Surgeon: Michelle L Grewal, MD;  Location: [facility];  Service: Gynecology;  Laterality: N/A;   DILATION AND CURETTAGE OF UTERUS     LIVER BIOPSY     x 2   UTERINE FIBROID SURGERY      Social History   Socioeconomic History   Marital status: Married    Spouse name: Not on file   Number of children: Not on file   Years of education: Not on file   Highest education level: Not on file  Occupational History   Not on file  Tobacco Use   Smoking status: Never   Smokeless tobacco: Never  Vaping Use   Vaping Use:   Never used  Substance and Sexual Activity   Alcohol use: Not Currently    Comment: stopped 10/21   Drug use: No   Sexual activity: Not on file  Other Topics Concern   Not on file  Social History Narrative   Not on file   Social Determinants of Health   Financial Resource Strain: Not on file  Food Insecurity: Not on file  Transportation Needs: Not on file  Physical Activity: Not on file  Stress: Not on file  Social Connections: Not on file  Intimate Partner Violence: Not on file     No Known Allergies   Outpatient Medications Prior to Visit  Medication Sig Dispense Refill   INTRAROSA 6.5 MG INST Place 1 tablet vaginally daily.     Multiple Vitamins-Iron (MULTI-VITAMIN/IRON) TABS Take 1 tablet by mouth daily.     mycophenolate  (MYFORTIC) 360 MG TBEC EC tablet Take 720 mg by mouth 2 (two) times daily.     progesterone (PROMETRIUM) 100 MG capsule Take 100 mg by mouth daily.     ursodiol (ACTIGALL) 500 MG tablet Take 500 mg by mouth 2 (two) times daily.  3   No facility-administered medications prior to visit.    Review of Systems  Constitutional:  Negative for chills, fever, malaise/fatigue and weight loss.  HENT:  Negative for hearing loss, sore throat and tinnitus.   Eyes:  Negative for blurred vision and double vision.  Respiratory:  Negative for cough, hemoptysis, sputum production, shortness of breath, wheezing and stridor.   Cardiovascular:  Negative for chest pain, palpitations, orthopnea, leg swelling and PND.  Gastrointestinal:  Negative for abdominal pain, constipation, diarrhea, heartburn, nausea and vomiting.  Genitourinary:  Negative for dysuria, hematuria and urgency.  Musculoskeletal:  Negative for joint pain and myalgias.  Skin:  Negative for itching and rash.  Neurological:  Negative for dizziness, tingling, weakness and headaches.  Endo/Heme/Allergies:  Negative for environmental allergies. Does not bruise/bleed easily.  Psychiatric/Behavioral:  Negative for depression. The patient is not nervous/anxious and does not have insomnia.   All other systems reviewed and are negative.   Objective:  Physical Exam Vitals reviewed.  Constitutional:      General: She is not in acute distress.    Appearance: She is well-developed.  HENT:     Head: Normocephalic and atraumatic.  Eyes:     General: No scleral icterus.    Conjunctiva/sclera: Conjunctivae normal.     Pupils: Pupils are equal, round, and reactive to light.  Neck:     Vascular: No JVD.     Trachea: No tracheal deviation.  Cardiovascular:     Rate and Rhythm: Normal rate and regular rhythm.     Heart sounds: Normal heart sounds. No murmur heard. Pulmonary:     Effort: Pulmonary effort is normal. No tachypnea, accessory muscle usage  or respiratory distress.     Breath sounds: No stridor. No wheezing, rhonchi or rales.  Abdominal:     General: Bowel sounds are normal. There is no distension.     Palpations: Abdomen is soft.     Tenderness: There is no abdominal tenderness.  Musculoskeletal:        General: No tenderness.     Cervical back: Neck supple.  Lymphadenopathy:     Cervical: No cervical adenopathy.  Skin:    General: Skin is warm and dry.     Capillary Refill: Capillary refill takes less than 2 seconds.     Findings: No rash.  Neurological:       Mental Status: She is alert and oriented to person, place, and time.  Psychiatric:        Behavior: Behavior normal.     Vitals:   10/13/20 1104  BP: 118/80  Pulse: 76  Temp: 98 F (36.7 C)  TempSrc: Oral  SpO2: 97%  Weight: 160 lb 9.6 oz (72.8 kg)  Height: 5' 7" (1.702 m)   97% on RA BMI Readings from Last 3 Encounters:  10/13/20 25.15 kg/m  09/07/20 25.37 kg/m  06/03/20 26.09 kg/m   Wt Readings from Last 3 Encounters:  10/13/20 160 lb 9.6 oz (72.8 kg)  09/07/20 162 lb (73.5 kg)  06/03/20 166 lb 9.6 oz (75.6 kg)     CBC    Component Value Date/Time   WBC 6.4 04/07/2020 1542   WBC 5.7 08/04/2015 0842   RBC 4.54 04/07/2020 1542   RBC 3.96 08/04/2015 0842   HGB 13.5 04/07/2020 1542   HCT 40.4 04/07/2020 1542   PLT 173 04/07/2020 1542   MCV 89 04/07/2020 1542   MCH 29.7 04/07/2020 1542   MCH 30.1 08/04/2015 0842   MCHC 33.4 04/07/2020 1542   MCHC 33.3 08/04/2015 0842   RDW 13.3 04/07/2020 1542   LYMPHSABS 1,539 08/04/2015 0842   MONOABS 513 08/04/2015 0842   EOSABS 57 08/04/2015 0842   BASOSABS 0 08/04/2015 0842     Chest Imaging:  10/08/2020 CT chest: Patient had a follow-up CT imaging from her June images which are about reveals a slight enlargement in the subsolid component right upper lobe nodule. We reviewed these images today in the office. The patient's images have been independently reviewed by me.    Pulmonary  Functions Testing Results: No flowsheet data found.  FeNO:   Pathology:   Echocardiogram:   Heart Catheterization:     Assessment & Plan:     ICD-10-CM   1. Right upper lobe pulmonary nodule  R91.1 Ambulatory referral to Cardiothoracic Surgery    Pulmonary function test    CANCELED: Pulmonary Function Test    2. Ground glass opacity present on imaging of lung  R91.8 Ambulatory referral to Cardiothoracic Surgery    Pulmonary function test    CANCELED: Pulmonary Function Test    3. Family history of lung cancer  Z80.1    Mother with EGFR Mutated NSCLC, Non-smoker      4. Non-smoker  Z78.9       Discussion:  This is a 55-year-old female lifelong non-smoker.  She is a pediatrician by training.  She had a mother with stage IV non-small cell lung cancer, non-smoker which was EGFR mutated.  Patient now herself is immunosuppressed with mycophenolate due to history of primary biliary cirrhosis and autoimmune hepatitis.  She has a subsolid nodule that has shown growth over 6 months.  Plan: Today we discussed the probability of malignancy related to her subsolid nodule. Based on size location and family history carries at least a 30% probability of malignancy using Brock subsolid calculator. We discussed the various options for tissue sampling. With her family history I too am worried about this lesion. I think we can consider robotic assisted bronchoscopy for tissue sampling.  She is agreeable to this plan. Either way I believe the lesion likely needs to come out.  We also discussed the possibility of a single anesthetic event.  As she is rightfully concerned that we could have a nondiagnostic biopsy and still would require watchful waiting. I explained that for a single anesthetic event we would prepare   for tissue biopsy dye marking and a wedge resection if my biopsies in the room were nondiagnostic.  If they were diagnostic of then could plan directly for lobectomy. I will discuss  case with thoracic surgery. I will schedule her PFTs to be completed ASAP. If they cannot be completed in the office please schedule PFTs for hospital. Patient is agreeable to this plan.   Current Outpatient Medications:    INTRAROSA 6.5 MG INST, Place 1 tablet vaginally daily., Disp: , Rfl:    Multiple Vitamins-Iron (MULTI-VITAMIN/IRON) TABS, Take 1 tablet by mouth daily., Disp: , Rfl:    mycophenolate (MYFORTIC) 360 MG TBEC EC tablet, Take 720 mg by mouth 2 (two) times daily., Disp: , Rfl:    progesterone (PROMETRIUM) 100 MG capsule, Take 100 mg by mouth daily., Disp: , Rfl:    ursodiol (ACTIGALL) 500 MG tablet, Take 500 mg by mouth 2 (two) times daily., Disp: , Rfl: 3  I spent 62 minutes dedicated to the care of this patient on the date of this encounter to include pre-visit review of records, face-to-face time with the patient discussing conditions above, post visit ordering of testing, clinical documentation with the electronic health record, making appropriate referrals as documented, and communicating necessary findings to members of the patients care team.   Augusto Deckman L Tuff Clabo, DO [facility] 10/13/2020 11:08 AM    

## 2020-10-14 ENCOUNTER — Ambulatory Visit: Payer: No Typology Code available for payment source | Admitting: Internal Medicine

## 2020-10-15 ENCOUNTER — Other Ambulatory Visit: Payer: Self-pay

## 2020-10-15 ENCOUNTER — Institutional Professional Consult (permissible substitution): Payer: No Typology Code available for payment source | Admitting: Thoracic Surgery (Cardiothoracic Vascular Surgery)

## 2020-10-15 ENCOUNTER — Other Ambulatory Visit: Payer: Self-pay | Admitting: Pulmonary Disease

## 2020-10-15 VITALS — BP 115/80 | HR 79 | Resp 20 | Ht 67.0 in | Wt 160.0 lb

## 2020-10-15 DIAGNOSIS — R911 Solitary pulmonary nodule: Secondary | ICD-10-CM | POA: Diagnosis not present

## 2020-10-15 LAB — CYTOLOGY - NON PAP

## 2020-10-15 NOTE — H&P (View-Only) (Signed)
PCP is Grewal, Michelle, MD Referring Provider is Icard, Bradley L, DO  Chief Complaint  Patient presents with   Lung Lesion    Surgical consult, Chest CT 10/08/20, PFT's scheduled for next week     HPI: Barbara Knight is sent for consultation regarding a right upper lobe lung nodule  Barbara Knight is a 54-year-old woman with a past medical history significant for autoimmune hepatitis, mitral valve prolapse, dilated ascending aorta, bradycardia, and a thyroid cyst.  She has a family history of aortic aneurysm and lung cancer.  Her mother died of an EGFR positive adenocarcinoma of the lung.  She was diagnosed with autoimmune hepatitis after abnormal LFTs were noted on routine labs.  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  She currently is being treated with Myfortic.  She had been on cyclosporine previously but that was stopped due to concerns for renal function.  She had COVID in January.  She had residual shortness of breath for quite a while after that.  She had CT to rule out coronary disease in March 2022.  Her coronary calcium score was 0.  It did show a right upper lobe groundglass opacity.  She was referred to Dr. Ramaswamy.  A repeat scan in June showed no change.  There was a 6 mm right lower lobe groundglass opacity noted as well.  She recently had another follow-up CT which showed an increase in size of the nodule from 12 x 10 to 14 x 9 mm.  There is a small solid component measuring about 3 mm.  The lower lobe opacity was unchanged.  She then was referred to Dr. Icard.  We discussed possible navigational bronchoscopy for biopsy and possible combined procedure with surgical resection.  She feels well.  She has no symptoms from her liver disease.  Her pulmonary status is essentially returned to normal.  She has not had any change in appetite or weight loss.  No chest pain, pressure, tightness, or shortness of breath.  Zubrod Score: At the time of surgery this patient's  most appropriate activity status/level should be described as: [x]    0    Normal activity, no symptoms []    1    Restricted in physical strenuous activity but ambulatory, able to do out light work []    2    Ambulatory and capable of self care, unable to do work activities, up and about >50 % of waking hours                              []    3    Only limited self care, in bed greater than 50% of waking hours []    4    Completely disabled, no self care, confined to bed or chair []    5    Moribund  Past Medical History:  Diagnosis Date   Acquired dilation of ascending aorta and aortic root (HCC)    38mm ascending aorta by CTA and echo 03/2020 and 40mm sinus of valsalva on echo   Anemia    Bradycardia 12/16/2014   Elevated LFTs    hepatitis virus was negative   Family history of aortic aneurysm    grandfather had aortic aneurysm repair and paternal uncle died of ruptured aortic aneurysm   Headache(784.0)    migraines   Hepatitis    autoimmune   Mitral valvular prolapse    posterior   mitral vavlve leaflet with mild MR   Thyroid disease    Has a thyroid cyst    Past Surgical History:  Procedure Laterality Date   DILATATION & CURETTAGE/HYSTEROSCOPY WITH TRUECLEAR N/A 09/13/2012   Procedure: DILATATION & CURETTAGE/HYSTEROSCOPY WITH TRUECLEAR and THERMACHOICE;  Surgeon: Michelle L Grewal, MD;  Location: [facility];  Service: Gynecology;  Laterality: N/A;   DILATATION & CURRETTAGE/HYSTEROSCOPY WITH RESECTOCOPE N/A 09/13/2012   Procedure: DILATATION & CURETTAGE/HYSTEROSCOPY WITH RESECTOCOPE;  Surgeon: Michelle L Grewal, MD;  Location: [facility];  Service: Gynecology;  Laterality: N/A;   DILATION AND CURETTAGE OF UTERUS     LIVER BIOPSY     x 2   UTERINE FIBROID SURGERY      Family History  Problem Relation Age of Onset   Lung cancer Mother    Barrett's esophagus Mother    Hypertension Father    Aortic aneurysm Paternal Uncle        died of ruptured aortic aneurysm   Aortic aneurysm  Paternal Grandfather        s/p repair   Colon cancer Neg Hx    Colon polyps Neg Hx    Esophageal cancer Neg Hx    Rectal cancer Neg Hx    Stomach cancer Neg Hx     Social History Social History   Tobacco Use   Smoking status: Never   Smokeless tobacco: Never  Vaping Use   Vaping Use: Never used  Substance Use Topics   Alcohol use: Not Currently    Comment: stopped 10/21   Drug use: No    Current Outpatient Medications  Medication Sig Dispense Refill   INTRAROSA 6.5 MG INST Place 1 tablet vaginally daily.     Multiple Vitamins-Iron (MULTI-VITAMIN/IRON) TABS Take 1 tablet by mouth daily.     mycophenolate (MYFORTIC) 360 MG TBEC EC tablet Take 720 mg by mouth 2 (two) times daily.     progesterone (PROMETRIUM) 100 MG capsule Take 100 mg by mouth daily.     ursodiol (ACTIGALL) 500 MG tablet Take 500 mg by mouth 2 (two) times daily.  3   No current facility-administered medications for this visit.    No Known Allergies  Review of Systems  Constitutional:  Negative for activity change, appetite change and unexpected weight change.  Respiratory:  Negative for cough and shortness of breath (Post COVID).   Cardiovascular:  Negative for chest pain and leg swelling.  Gastrointestinal:  Negative for abdominal distention and abdominal pain.  Genitourinary:  Negative for difficulty urinating and dysuria.  Neurological:  Negative for syncope and weakness.  Hematological:  Negative for adenopathy. Does not bruise/bleed easily.   BP 115/80   Pulse 79   Resp 20   Ht 5' 7" (1.702 m)   Wt 160 lb (72.6 kg)   SpO2 97% Comment: RA  BMI 25.06 kg/m  Physical Exam Vitals reviewed.  Constitutional:      General: She is not in acute distress.    Appearance: Normal appearance.  HENT:     Head: Normocephalic and atraumatic.  Eyes:     General: No scleral icterus.    Extraocular Movements: Extraocular movements intact.  Cardiovascular:     Rate and Rhythm: Normal rate and regular  rhythm.     Pulses: Normal pulses.     Heart sounds: Normal heart sounds. No murmur (I did not hear murmur, has history of MVP) heard.   No friction rub. No gallop.  Pulmonary:     Effort: Pulmonary effort   is normal. No respiratory distress.     Breath sounds: Normal breath sounds. No wheezing.  Abdominal:     General: There is no distension.     Palpations: Abdomen is soft.     Tenderness: There is no abdominal tenderness.  Musculoskeletal:     Cervical back: Neck supple.  Lymphadenopathy:     Cervical: No cervical adenopathy.  Skin:    General: Skin is warm and dry.     Coloration: Skin is not jaundiced.  Neurological:     General: No focal deficit present.     Mental Status: She is alert and oriented to person, place, and time.     Cranial Nerves: No cranial nerve deficit.     Motor: No weakness.    Diagnostic Tests: CT CHEST WITHOUT CONTRAST   TECHNIQUE: Multidetector CT imaging of the chest was performed using thin slice collimation for electromagnetic bronchoscopy planning purposes, without intravenous contrast.   COMPARISON:  CT 07/01/2020   FINDINGS: Cardiovascular: Heart is normal in size. No pericardial effusion. Minimal stable aortic calcification. No aneurysm no definite coronary artery calcifications.   Mediastinum/Nodes: No mediastinal hilar mass lymphadenopathy. Small scattered lymph nodes are stable.   Lungs/Pleura: 14 x 9 mm sub solid nodular opacity in the right upper lobe. This previously measured approximately 12 x 10 mm. Central more solid-appearing nodular component measures approximately 4 mm and previously measured 3 mm.   No other pulmonary lesions are identified. No acute pulmonary findings. No pleural effusions or pleural nodules.   Upper Abdomen: Stable cirrhotic changes involving the liver with marked nodularity but no obvious hepatic lesions. Evidence of portal venous hypertension with portal venous collaterals.   Musculoskeletal:  No significant bony findings. Stable right thyroid lobe mass This has been evaluated on previous imaging. (ref: J Am Coll Radiol. 2015 Feb;12(2): 143-50).   IMPRESSION: 1. Slight interval enlargement of the right upper lobe sub solid nodular opacity. 2. No mediastinal or hilar mass or adenopathy. 3. Stable cirrhotic changes involving the liver with portal venous hypertension and portal venous collaterals. 4. Aortic atherosclerosis.   Aortic Atherosclerosis (ICD10-I70.0).     Electronically Signed   By: P.  Gallerani M.D.   On: 10/10/2020 11:41 I personally reviewed the CT images.  There is a 14 x 9 mm mixed density nodule in the apical segment of the right upper lobe.  I did not see any difference in the size of the solid component.  Impression: Barbara Knight is a 54-year-old woman with a past medical history significant for autoimmune hepatitis, mitral valve prolapse, dilated ascending aorta, bradycardia, and a thyroid cyst.  She appears perfectly healthy and is essentially asymptomatic.  She is a lifelong non-smoker.  She had COVID back in January and had prolonged post COVID shortness of breath.  She had a CT for coronary calcium score which showed no evidence of CAD but there was a nodule in the right upper lobe.  That led to a CT of the chest in June which showed 12 x 9 mm mixed density right upper lobe nodule.  A follow-up CT in September showed a slight increase in size.  I personally do not see any difference in the size of the solid component.  The differential diagnosis includes primary bronchogenic carcinoma (low-grade adenocarcinoma), as well as infectious and inflammatory nodules.  She has an autoimmune hepatitis and has been on Myfortic for that.  That would increase the probability that she could have an atypical infection.  However, she   also has a family history of her mother dying of metastatic EGFR positive adenocarcinoma, which obviously raises concern for this being an  adenocarcinoma.  We discussed multiple options including continued radiographic follow-up, bronchoscopy for biopsy, primary surgical resection, or bronchoscopy for biopsy and marking followed by surgical resection.  The nodule is in the apical segment but is low enough that it could be difficult to identify at the time of her robotic surgery.  After thoroughly reviewing films I think the best option would be to have Dr. Icard do a navigational bronchoscopy to biopsy the lesion and marked the tumor and then we could proceed under the same anesthetic with surgical resection.  Given the location of the tumor we may be able to get by with a wedge resection or apical segmentectomy.  However if there is any question of an adequate margin we could do a right upper lobectomy.  I informed her of the general nature of the surgery including the need for general anesthesia, the incisions to be used, the use of the robot, the use of drains to postoperatively, the expected hospital stay, and the overall recovery.  I informed her of the indications, risks, benefits, and alternatives.  She understands the risks include, but not limited to death, MI, DVT, PE, bleeding, possibly transfusion, infection, prolonged air leaks, cardiac arrhythmias, as well as possibility of other unforeseeable complications.  She is a low risk candidate for any of those complications.  After discussion of the options she favors bronchoscopy for biopsy and marking and then proceeding with surgical resection under the same anesthetic.  Plan: Will discuss timing with Dr. Icard  Pulmonary function testing    Lilyanah Celestin C Ramsay Bognar, MD [facility] [phone]  

## 2020-10-15 NOTE — Progress Notes (Signed)
PCP is Grewal, Michelle, MD Referring Provider is Icard, Bradley L, DO  Chief Complaint  Patient presents with   Lung Lesion    Surgical consult, Chest CT 10/08/20, PFT's scheduled for next week     HPI: [name] is sent for consultation regarding a right upper lobe lung nodule  Barbara Knight is a 55-year-old woman with a past medical history significant for autoimmune hepatitis, mitral valve prolapse, dilated ascending aorta, bradycardia, and a thyroid cyst.  She has a family history of aortic aneurysm and lung cancer.  Her mother died of an EGFR positive adenocarcinoma of the lung.  She was diagnosed with autoimmune hepatitis after abnormal LFTs were noted on routine labs.  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  She currently is being treated with Myfortic.  She had been on cyclosporine previously but that was stopped due to concerns for renal function.  She had COVID in January.  She had residual shortness of breath for quite a while after that.  She had CT to rule out coronary disease in March 2022.  Her coronary calcium score was 0.  It did show a right upper lobe groundglass opacity.  She was referred to Dr. Ramaswamy.  A repeat scan in June showed no change.  There was a 6 mm right lower lobe groundglass opacity noted as well.  She recently had another follow-up CT which showed an increase in size of the nodule from 12 x 10 to 14 x 9 mm.  There is a small solid component measuring about 3 mm.  The lower lobe opacity was unchanged.  She then was referred to Dr. Icard.  We discussed possible navigational bronchoscopy for biopsy and possible combined procedure with surgical resection.  She feels well.  She has no symptoms from her liver disease.  Her pulmonary status is essentially returned to normal.  She has not had any change in appetite or weight loss.  No chest pain, pressure, tightness, or shortness of breath.  Zubrod Score: At the time of surgery this patient's  most appropriate activity status/level should be described as: [x]    0    Normal activity, no symptoms []    1    Restricted in physical strenuous activity but ambulatory, able to do out light work []    2    Ambulatory and capable of self care, unable to do work activities, up and about >50 % of waking hours                              []    3    Only limited self care, in bed greater than 50% of waking hours []    4    Completely disabled, no self care, confined to bed or chair []    5    Moribund  Past Medical History:  Diagnosis Date   Acquired dilation of ascending aorta and aortic root (HCC)    38mm ascending aorta by CTA and echo 03/2020 and 40mm sinus of valsalva on echo   Anemia    Bradycardia 12/16/2014   Elevated LFTs    hepatitis virus was negative   Family history of aortic aneurysm    grandfather had aortic aneurysm repair and paternal uncle died of ruptured aortic aneurysm   Headache(784.0)    migraines   Hepatitis    autoimmune   Mitral valvular prolapse    posterior   mitral vavlve leaflet with mild MR   Thyroid disease    Has a thyroid cyst    Past Surgical History:  Procedure Laterality Date   DILATATION & CURETTAGE/HYSTEROSCOPY WITH TRUECLEAR N/A 09/13/2012   Procedure: DILATATION & CURETTAGE/HYSTEROSCOPY WITH TRUECLEAR and THERMACHOICE;  Surgeon: Cyril Mourning, MD;  Location: [facility];  Service: Gynecology;  Laterality: N/A;   DILATATION & CURRETTAGE/HYSTEROSCOPY WITH RESECTOCOPE N/A 09/13/2012   Procedure: [place];  Surgeon: Cyril Mourning, MD;  Location: [facility];  Service: Gynecology;  Laterality: N/A;   DILATION AND CURETTAGE OF UTERUS     LIVER BIOPSY     x 2   UTERINE FIBROID SURGERY      Family History  Problem Relation Age of Onset   Lung cancer Mother    Barrett's esophagus Mother    Hypertension Father    Aortic aneurysm Paternal Uncle        died of ruptured aortic aneurysm   Aortic aneurysm  Paternal Grandfather        s/p repair   Colon cancer Neg Hx    Colon polyps Neg Hx    Esophageal cancer Neg Hx    Rectal cancer Neg Hx    Stomach cancer Neg Hx     Social History Social History   Tobacco Use   Smoking status: Never   Smokeless tobacco: Never  Vaping Use   Vaping Use: Never used  Substance Use Topics   Alcohol use: Not Currently    Comment: stopped 10/21   Drug use: No    Current Outpatient Medications  Medication Sig Dispense Refill   INTRAROSA 6.5 MG INST Place 1 tablet vaginally daily.     Multiple Vitamins-Iron (MULTI-VITAMIN/IRON) TABS Take 1 tablet by mouth daily.     mycophenolate (MYFORTIC) 360 MG TBEC EC tablet Take 720 mg by mouth 2 (two) times daily.     progesterone (PROMETRIUM) 100 MG capsule Take 100 mg by mouth daily.     ursodiol (ACTIGALL) 500 MG tablet Take 500 mg by mouth 2 (two) times daily.  3   No current facility-administered medications for this visit.    No Known Allergies  Review of Systems  Constitutional:  Negative for activity change, appetite change and unexpected weight change.  Respiratory:  Negative for cough and shortness of breath (Post COVID).   Cardiovascular:  Negative for chest pain and leg swelling.  Gastrointestinal:  Negative for abdominal distention and abdominal pain.  Genitourinary:  Negative for difficulty urinating and dysuria.  Neurological:  Negative for syncope and weakness.  Hematological:  Negative for adenopathy. Does not bruise/bleed easily.   BP 115/80   Pulse 79   Resp 20   Ht 5' 7" (1.702 m)   Wt 160 lb (72.6 kg)   SpO2 97% Comment: RA  BMI 25.06 kg/m  Physical Exam Vitals reviewed.  Constitutional:      General: She is not in acute distress.    Appearance: Normal appearance.  HENT:     Head: Normocephalic and atraumatic.  Eyes:     General: No scleral icterus.    Extraocular Movements: Extraocular movements intact.  Cardiovascular:     Rate and Rhythm: Normal rate and regular  rhythm.     Pulses: Normal pulses.     Heart sounds: Normal heart sounds. No murmur (I did not hear murmur, has history of MVP) heard.   No friction rub. No gallop.  Pulmonary:     Effort: Pulmonary effort  is normal. No respiratory distress.     Breath sounds: Normal breath sounds. No wheezing.  Abdominal:     General: There is no distension.     Palpations: Abdomen is soft.     Tenderness: There is no abdominal tenderness.  Musculoskeletal:     Cervical back: Neck supple.  Lymphadenopathy:     Cervical: No cervical adenopathy.  Skin:    General: Skin is warm and dry.     Coloration: Skin is not jaundiced.  Neurological:     General: No focal deficit present.     Mental Status: She is alert and oriented to person, place, and time.     Cranial Nerves: No cranial nerve deficit.     Motor: No weakness.    Diagnostic Tests: CT CHEST WITHOUT CONTRAST   TECHNIQUE: Multidetector CT imaging of the chest was performed using thin slice collimation for electromagnetic bronchoscopy planning purposes, without intravenous contrast.   COMPARISON:  CT 07/01/2020   FINDINGS: Cardiovascular: Heart is normal in size. No pericardial effusion. Minimal stable aortic calcification. No aneurysm no definite coronary artery calcifications.   Mediastinum/Nodes: No mediastinal hilar mass lymphadenopathy. Small scattered lymph nodes are stable.   Lungs/Pleura: 14 x 9 mm sub solid nodular opacity in the right upper lobe. This previously measured approximately 12 x 10 mm. Central more solid-appearing nodular component measures approximately 4 mm and previously measured 3 mm.   No other pulmonary lesions are identified. No acute pulmonary findings. No pleural effusions or pleural nodules.   Upper Abdomen: Stable cirrhotic changes involving the liver with marked nodularity but no obvious hepatic lesions. Evidence of portal venous hypertension with portal venous collaterals.   Musculoskeletal:  No significant bony findings. Stable right thyroid lobe mass This has been evaluated on previous imaging. (ref: J Am Coll Radiol. 2015 Feb;12(2): 143-50).   IMPRESSION: 1. Slight interval enlargement of the right upper lobe sub solid nodular opacity. 2. No mediastinal or hilar mass or adenopathy. 3. Stable cirrhotic changes involving the liver with portal venous hypertension and portal venous collaterals. 4. Aortic atherosclerosis.   Aortic Atherosclerosis (ICD10-I70.0).     Electronically Signed   By: P.  Gallerani M.D.   On: 10/10/2020 11:41 I personally reviewed the CT images.  There is a 14 x 9 mm mixed density nodule in the apical segment of the right upper lobe.  I did not see any difference in the size of the solid component.  Impression: Barbara Knight is a 55-year-old woman with a past medical history significant for autoimmune hepatitis, mitral valve prolapse, dilated ascending aorta, bradycardia, and a thyroid cyst.  She appears perfectly healthy and is essentially asymptomatic.  She is a lifelong non-smoker.  She had COVID back in January and had prolonged post COVID shortness of breath.  She had a CT for coronary calcium score which showed no evidence of CAD but there was a nodule in the right upper lobe.  That led to a CT of the chest in June which showed 12 x 9 mm mixed density right upper lobe nodule.  A follow-up CT in September showed a slight increase in size.  I personally do not see any difference in the size of the solid component.  The differential diagnosis includes primary bronchogenic carcinoma (low-grade adenocarcinoma), as well as infectious and inflammatory nodules.  She has an autoimmune hepatitis and has been on Myfortic for that.  That would increase the probability that she could have an atypical infection.  However, she   also has a family history of her mother dying of metastatic EGFR positive adenocarcinoma, which obviously raises concern for this being an  adenocarcinoma.  We discussed multiple options including continued radiographic follow-up, bronchoscopy for biopsy, primary surgical resection, or bronchoscopy for biopsy and marking followed by surgical resection.  The nodule is in the apical segment but is low enough that it could be difficult to identify at the time of her robotic surgery.  After thoroughly reviewing films I think the best option would be to have Dr. Icard do a navigational bronchoscopy to biopsy the lesion and marked the tumor and then we could proceed under the same anesthetic with surgical resection.  Given the location of the tumor we may be able to get by with a wedge resection or apical segmentectomy.  However if there is any question of an adequate margin we could do a right upper lobectomy.  I informed her of the general nature of the surgery including the need for general anesthesia, the incisions to be used, the use of the robot, the use of drains to postoperatively, the expected hospital stay, and the overall recovery.  I informed her of the indications, risks, benefits, and alternatives.  She understands the risks include, but not limited to death, MI, DVT, PE, bleeding, possibly transfusion, infection, prolonged air leaks, cardiac arrhythmias, as well as possibility of other unforeseeable complications.  She is a low risk candidate for any of those complications.  After discussion of the options she favors bronchoscopy for biopsy and marking and then proceeding with surgical resection under the same anesthetic.  Plan: Will discuss timing with Dr. Icard  Pulmonary function testing    Steven C Hendrickson, MD [facility] [phone]  

## 2020-10-15 NOTE — Telephone Encounter (Signed)
Hello Dr. Valeta Harms, please see mychart message below, thanks!  I saw Dr. Roxan Hockey today and we agreed on diagnostic bronchoscope with marker /surgical removal plan as you discussed with me. He said your offices would coordinate schedules. I will be ready next week if that is available. Thank you. Barbara Knight

## 2020-10-16 LAB — SARS CORONAVIRUS 2 (TAT 6-24 HRS): SARS Coronavirus 2: NEGATIVE

## 2020-10-18 ENCOUNTER — Other Ambulatory Visit: Payer: Self-pay | Admitting: *Deleted

## 2020-10-18 ENCOUNTER — Ambulatory Visit (HOSPITAL_COMMUNITY)
Admission: RE | Admit: 2020-10-18 | Discharge: 2020-10-18 | Disposition: A | Discharge status: Home / Self Care | Payer: No Typology Code available for payment source | Source: Ambulatory Visit | Attending: Pulmonary Disease | Admitting: Pulmonary Disease

## 2020-10-18 DIAGNOSIS — R918 Other nonspecific abnormal finding of lung field: Secondary | ICD-10-CM | POA: Insufficient documentation

## 2020-10-18 DIAGNOSIS — R911 Solitary pulmonary nodule: Secondary | ICD-10-CM | POA: Diagnosis not present

## 2020-10-18 LAB — PULMONARY FUNCTION TEST
DL/VA % pred: 88 %
DL/VA: 3.67 ml/min/mmHg/L
DLCO unc % pred: 90 %
DLCO unc: 20.65 ml/min/mmHg
FEF 25-75 Post: 3.02 L/sec
FEF 25-75 Pre: 3.12 L/sec
FEF2575-%Change-Post: -3 %
FEF2575-%Pred-Post: 112 %
FEF2575-%Pred-Pre: 115 %
FEV1-%Change-Post: -1 %
FEV1-%Pred-Post: 110 %
FEV1-%Pred-Pre: 111 %
FEV1-Post: 3.27 L
FEV1-Pre: 3.3 L
FEV1FVC-%Change-Post: 2 %
FEV1FVC-%Pred-Pre: 102 %
FEV6-%Change-Post: -2 %
FEV6-%Pred-Post: 107 %
FEV6-%Pred-Pre: 111 %
FEV6-Post: 3.95 L
FEV6-Pre: 4.07 L
FEV6FVC-%Change-Post: 0 %
FEV6FVC-%Pred-Post: 103 %
FEV6FVC-%Pred-Pre: 103 %
FVC-%Change-Post: -3 %
FVC-%Pred-Post: 104 %
FVC-%Pred-Pre: 107 %
FVC-Post: 3.95 L
FVC-Pre: 4.08 L
Post FEV1/FVC ratio: 83 %
Post FEV6/FVC ratio: 100 %
Pre FEV1/FVC ratio: 81 %
Pre FEV6/FVC Ratio: 100 %
RV % pred: 92 %
RV: 1.9 L
TLC % pred: 110 %
TLC: 6.08 L

## 2020-10-18 MED ORDER — ALBUTEROL SULFATE (2.5 MG/3ML) 0.083% IN NEBU
2.5000 mg | INHALATION_SOLUTION | Freq: Once | RESPIRATORY_TRACT | Status: AC
  Administered 2020-10-18: 2.5 mg via RESPIRATORY_TRACT

## 2020-10-20 ENCOUNTER — Encounter: Payer: No Typology Code available for payment source | Admitting: Thoracic Surgery (Cardiothoracic Vascular Surgery)

## 2020-10-20 ENCOUNTER — Telehealth: Payer: Self-pay | Admitting: Pulmonary Disease

## 2020-10-20 DIAGNOSIS — R911 Solitary pulmonary nodule: Secondary | ICD-10-CM

## 2020-10-20 NOTE — Telephone Encounter (Signed)
-----   Message from Laury Deep, RN sent at 10/19/2020 10:47 AM EDT ----- Regarding: RE: Combo case Thanks.  She is all set.  Spoke with her last night about her pre-op appt and check in time day of srgy.  Thanks, Thurmond Butts ----- Message ----- From: Garner Nash, DO Sent: 10/19/2020  10:43 AM EDT To: Darral Dash, RN, Laury Deep, RN, # Subject: RE: Combo case                                 Ok sounds good Monday the 3rd works.  Thanks  BLI   ----- Message ----- From: Laury Deep, RN Sent: 10/18/2020  11:09 AM EDT To: Darral Dash, RN, [name], MD, # Subject: RE: Combo case                                 All, I spoke with the patient and gave her about 5-6 dates before and she picked Monday 10/3 afternoon.  Dr. Roxan Hockey has a CABG and she is aware of that.  She will be posted in OR 10 for Robot around 1:30pm.  Dr. Valeta Harms: you can post your part prior to that.  Side note: She REALLY wanted surgery at the end of this week on Friday but I told her Dr. Roxan Hockey was off.  I told her if something changes, I will let her know ASAP.  Thanks, Thurmond Butts ----- Message ----- From: Garner Nash, DO Sent: 10/17/2020   8:16 PM EDT To: Darral Dash, RN, Laury Deep, RN, # Subject: Combo case                                     Braulio Bosch and I spoke about Ms. Secrist for a combo case. Options for dates  Oct 3/4/5/6/7th, pretty much any time we can try to work out. Oct 11th - mid day second case? Oct 13th 730 AM Start  Oct 21st 730 AM Start  Oct 27th/28th 730 AM Start  Thanks  [place]

## 2020-10-20 NOTE — Progress Notes (Addendum)
Surgical Instructions    Your procedure is scheduled on Monday October 3rd.  Report to [facility] Main Entrance "A" at 10:30 A.M., then check in with the Admitting office.  Call this number if you have problems the morning of surgery:  [phone]   If you have any questions prior to your surgery date call [phone]: Open Monday-Friday 8am-4pm    Remember:  Do not eat or drink anything after midnight the night before your surgery    Take these medicines the morning of surgery with A SIP OF WATER mycophenolate (MYFORTIC) 360 MG TBEC EC tablet progesterone (PROMETRIUM) 100 MG capsule ursodiol (ACTIGALL) 500 MG tablet   As of today, STOP taking any Aspirin (unless otherwise instructed by your surgeon) Aleve, Naproxen, Ibuprofen, Motrin, Advil, Goody's, BC's, all herbal medications, fish oil, and all vitamins.          Do not wear jewelry or makeup Do not wear lotions, powders, perfumes, or deodorant. Do not shave 48 hours prior to surgery.   Do not bring valuables to the hospital. DO Not wear nail polish, gel polish, artificial nails, or any other type of covering on natural nails including finger and toenails. If patients have artificial nails, gel coating, etc. that need to be removed by a nail salon please have this removed prior to surgery or surgery may need to be canceled/delayed if the surgeon/ anesthesia feels like the patient is unable to be adequately monitored.             [facility] is not responsible for any belongings or valuables.  Do NOT Smoke (Tobacco/Vaping)  24 hours prior to your procedure If you use a CPAP at night, you may bring your mask for your overnight stay.   Contacts, glasses, dentures or bridgework may not be worn into surgery, please bring cases for these belongings   For patients admitted to the hospital, discharge time will be determined by your treatment team.   Patients discharged the day of surgery will not be allowed to drive home, and  someone needs to stay with them for 24 hours.  NO VISITORS WILL BE ALLOWED IN PRE-OP WHERE PATIENTS GET READY FOR SURGERY.  ONLY 1 SUPPORT PERSON MAY BE PRESENT IN THE WAITING ROOM WHILE YOU ARE IN SURGERY.  IF YOU ARE TO BE ADMITTED, ONCE YOU ARE IN YOUR ROOM YOU WILL BE ALLOWED TWO (2) VISITORS.  Minor children may have two parents present. Special consideration for safety and communication needs will be reviewed on a case by case basis.  Special instructions:    Oral Hygiene is also important to reduce your risk of infection.  Remember - BRUSH YOUR TEETH THE MORNING OF SURGERY WITH YOUR REGULAR TOOTHPASTE   [facility]- Preparing For Surgery  Before surgery, you can play an important role. Because skin is not sterile, your skin needs to be as free of germs as possible. You can reduce the number of germs on your skin by washing with CHG (chlorahexidine gluconate) Soap before surgery.  CHG is an antiseptic cleaner which kills germs and bonds with the skin to continue killing germs even after washing.     Please do not use if you have an allergy to CHG or antibacterial soaps. If your skin becomes reddened/irritated stop using the CHG.  Do not shave (including legs and underarms) for at least 48 hours prior to first CHG shower. It is OK to shave your face.  Please follow these instructions carefully.  Shower the NIGHT BEFORE SURGERY and the MORNING OF SURGERY with CHG Soap.   If you chose to wash your hair, wash your hair first as usual with your normal shampoo. After you shampoo, rinse your hair and body thoroughly to remove the shampoo.  Then [employer] and genitals (private parts) with your normal soap and rinse thoroughly to remove soap.  After that Use CHG Soap as you would any other liquid soap. You can apply CHG directly to the skin and wash gently with a scrungie or a clean washcloth.   Apply the CHG Soap to your body ONLY FROM THE NECK DOWN.  Do not use on open wounds or open  sores. Avoid contact with your eyes, ears, mouth and genitals (private parts). Wash Face and genitals (private parts)  with your normal soap.   Wash thoroughly, paying special attention to the area where your surgery will be performed.  Thoroughly rinse your body with warm water from the neck down.  DO NOT shower/wash with your normal soap after using and rinsing off the CHG Soap.  Pat yourself dry with a CLEAN TOWEL.  Wear CLEAN PAJAMAS to bed the night before surgery  Place CLEAN SHEETS on your bed the night before your surgery  DO NOT SLEEP WITH PETS.   Day of Surgery:  Take a shower with CHG soap. Wear Clean/Comfortable clothing the morning of surgery Do not apply any deodorants/lotions.   Remember to brush your teeth WITH YOUR REGULAR TOOTHPASTE.   Please read over the following fact sheets that you were given.

## 2020-10-20 NOTE — Telephone Encounter (Signed)
Orders placed for bronchoscopy RAB (Dr. Valeta Harms) prior to RATS by Dr. Roxan Hockey on 10/25/2020  Barbara Nash, DO [facility] 10/20/2020 8:51 AM

## 2020-10-20 NOTE — Telephone Encounter (Signed)
Pt is aware of appt 10/25/20 I have also requested the disk from [facility]

## 2020-10-21 ENCOUNTER — Encounter (HOSPITAL_COMMUNITY)
Admission: RE | Admit: 2020-10-21 | Discharge: 2020-10-21 | Disposition: A | Discharge status: Home / Self Care | Payer: No Typology Code available for payment source | Source: Ambulatory Visit | Attending: Pulmonary Disease | Admitting: Pulmonary Disease

## 2020-10-21 ENCOUNTER — Encounter (HOSPITAL_COMMUNITY)
Admission: RE | Admit: 2020-10-21 | Discharge: 2020-10-21 | Disposition: A | Discharge status: Home / Self Care | Payer: No Typology Code available for payment source | Source: Ambulatory Visit | Attending: Thoracic Surgery (Cardiothoracic Vascular Surgery) | Admitting: Thoracic Surgery (Cardiothoracic Vascular Surgery)

## 2020-10-21 ENCOUNTER — Encounter (HOSPITAL_COMMUNITY): Payer: Self-pay

## 2020-10-21 ENCOUNTER — Other Ambulatory Visit: Payer: Self-pay

## 2020-10-21 DIAGNOSIS — D649 Anemia, unspecified: Secondary | ICD-10-CM | POA: Insufficient documentation

## 2020-10-21 DIAGNOSIS — R7401 Elevation of levels of liver transaminase levels: Secondary | ICD-10-CM | POA: Diagnosis not present

## 2020-10-21 DIAGNOSIS — Z01818 Encounter for other preprocedural examination: Secondary | ICD-10-CM | POA: Diagnosis present

## 2020-10-21 DIAGNOSIS — Z79899 Other long term (current) drug therapy: Secondary | ICD-10-CM | POA: Insufficient documentation

## 2020-10-21 DIAGNOSIS — K754 Autoimmune hepatitis: Secondary | ICD-10-CM | POA: Insufficient documentation

## 2020-10-21 DIAGNOSIS — D440 Neoplasm of uncertain behavior of thyroid gland: Secondary | ICD-10-CM | POA: Insufficient documentation

## 2020-10-21 DIAGNOSIS — Z8616 Personal history of COVID-19: Secondary | ICD-10-CM | POA: Insufficient documentation

## 2020-10-21 DIAGNOSIS — R911 Solitary pulmonary nodule: Secondary | ICD-10-CM | POA: Insufficient documentation

## 2020-10-21 DIAGNOSIS — Z79890 Hormone replacement therapy (postmenopausal): Secondary | ICD-10-CM | POA: Insufficient documentation

## 2020-10-21 DIAGNOSIS — Z20822 Contact with and (suspected) exposure to covid-19: Secondary | ICD-10-CM | POA: Insufficient documentation

## 2020-10-21 LAB — COMPREHENSIVE METABOLIC PANEL
ALT: 30 U/L (ref 0–44)
AST: 29 U/L (ref 15–41)
Albumin: 3.9 g/dL (ref 3.5–5.0)
Alkaline Phosphatase: 119 U/L (ref 38–126)
Anion gap: 9 (ref 5–15)
BUN: 18 mg/dL (ref 6–20)
CO2: 20 mmol/L — ABNORMAL LOW (ref 22–32)
Calcium: 8.8 mg/dL — ABNORMAL LOW (ref 8.9–10.3)
Chloride: 107 mmol/L (ref 98–111)
Creatinine, Ser: 0.75 mg/dL (ref 0.44–1.00)
GFR, Estimated: >60 mL/min (ref >60)
Glucose, Bld: 116 mg/dL — ABNORMAL HIGH (ref 70–99)
Potassium: 3.7 mmol/L (ref 3.5–5.1)
Sodium: 136 mmol/L (ref 135–145)
Total Bilirubin: 0.7 mg/dL (ref 0.3–1.2)
Total Protein: 6.7 g/dL (ref 6.5–8.1)

## 2020-10-21 LAB — URINALYSIS, ROUTINE W REFLEX MICROSCOPIC
Bilirubin Urine: NEGATIVE
Glucose, UA: NEGATIVE mg/dL
Hgb urine dipstick: NEGATIVE
Ketones, ur: NEGATIVE mg/dL
Leukocytes,Ua: NEGATIVE
Nitrite: NEGATIVE
Protein, ur: NEGATIVE mg/dL
Specific Gravity, Urine: 1.004 — ABNORMAL LOW (ref 1.005–1.030)
pH: 6 (ref 5.0–8.0)

## 2020-10-21 LAB — TYPE AND SCREEN
ABO/RH(D): A POS
Antibody Screen: NEGATIVE

## 2020-10-21 LAB — CBC
HCT: 41.2 % (ref 36.0–46.0)
Hemoglobin: 13.8 g/dL (ref 12.0–15.0)
MCH: 29.9 pg (ref 26.0–34.0)
MCHC: 33.5 g/dL (ref 30.0–36.0)
MCV: 89.2 fL (ref 80.0–100.0)
Platelets: 155 10*3/uL (ref 150–400)
RBC: 4.62 MIL/uL (ref 3.87–5.11)
RDW: 13.5 % (ref 11.5–15.5)
WBC: 6.9 10*3/uL (ref 4.0–10.5)
nRBC: 0 % (ref 0.0–0.2)

## 2020-10-21 LAB — BLOOD GAS, ARTERIAL
Acid-base deficit: 2.1 mmol/L — ABNORMAL HIGH (ref 0.0–2.0)
Bicarbonate: 21.5 mmol/L (ref 20.0–28.0)
FIO2: 21
O2 Saturation: 97.3 %
Patient temperature: 37
pCO2 arterial: 32.6 mmHg (ref 32.0–48.0)
pH, Arterial: 7.435 (ref 7.350–7.450)
pO2, Arterial: 92.1 mmHg (ref 83.0–108.0)

## 2020-10-21 LAB — SURGICAL PCR SCREEN
MRSA, PCR: NEGATIVE
Staphylococcus aureus: POSITIVE — AB

## 2020-10-21 LAB — APTT: aPTT: 29 seconds (ref 24–36)

## 2020-10-21 LAB — SARS CORONAVIRUS 2 (TAT 6-24 HRS): SARS Coronavirus 2: NEGATIVE

## 2020-10-21 LAB — PROTIME-INR
INR: 1.1 (ref 0.8–1.2)
Prothrombin Time: 14.4 seconds (ref 11.4–15.2)

## 2020-10-21 NOTE — Progress Notes (Signed)
PCP - [name], MD   Cardiologist - Dr. Golden Hurter [facility]  Chest x-ray - 10/21/20 EKG - 10/21/20 ECHO - 04/27/20  DM - Denies  COVID TEST-  10/21/20   Anesthesia review: Yes cardiac history  Patient denies shortness of breath, fever, cough and chest pain at PAT appointment   All instructions explained to the patient, with a verbal understanding of the material. Patient agrees to go over the instructions while at home for a better understanding. Patient also instructed to wear a mask while in public after being tested for COVID-19. The opportunity to ask questions was provided.

## 2020-10-22 NOTE — Progress Notes (Signed)
Anesthesia Chart Review:  Case: [id number] Date/Time: 10/25/20 1315   Procedure: XI ROBOTIC ASSISTED THORASCOPY-WEDGE RESECTION, possible segmentectomy, possible lobectomy (Right: Chest)   Anesthesia type: General   Pre-op diagnosis: RUL LUNG NODULE   Location: [facility] OR ROOM 10 / [place] OR   Surgeons: [name], MD       DISCUSSION: Patient is a 55 year old female scheduled for the above procedure for further evaluation of RUL lung nodule.  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  Dr. Roxan Hockey classified her Zubrod score as 0: Normal activity, no symptoms.  Preoperative EKG and labs acceptable for OR.  10/21/20 CXR is still in process.  10/21/2020 presurgical COVID-19 test negative.  Anesthesia team to evaluate on the day of surgery.   VS: BP 120/85   Pulse 71   Temp 36.8 C (Oral)   Resp 18   Ht 5\' 7"  (1.702 m)   Wt 74.3 kg   LMP 11/11/2014 (Approximate)   SpO2 99%   BMI 25.65 kg/m    PROVIDERS: [name], MD is GYN Fransico Him, MD is cardiologist. Last visit 04/27/20.  1 year follow-up recommended. Milas Kocher, MD & June Leap, DO are pulmonologists Jerrell Belfast, MD is ENT Charlean Sanfilippo, MD is GI Terald Sleeper)   LABS: Labs reviewed: Acceptable for surgery. (all labs ordered are listed, but only abnormal results are displayed)  Labs Reviewed  SURGICAL PCR SCREEN - Abnormal; Notable for the following components:      Result Value   Staphylococcus aureus POSITIVE (*)    All other components within normal limits  COMPREHENSIVE METABOLIC PANEL - Abnormal; Notable for the  following components:   CO2 20 (*)    Glucose, Bld 116 (*)    Calcium 8.8 (*)    All other components within normal limits  BLOOD GAS, ARTERIAL - Abnormal; Notable for the following components:   Acid-base deficit 2.1 (*)    Allens test (pass/fail) BRACHIAL ARTERY (*)    All other components within normal limits  URINALYSIS, ROUTINE W REFLEX MICROSCOPIC - Abnormal; Notable for the following components:   Color, Urine STRAW (*)    Specific Gravity, Urine 1.004 (*)    All other components within normal limits  SARS CORONAVIRUS 2 (TAT 6-24 HRS)  CBC  PROTIME-INR  APTT  TYPE AND SCREEN    OTHER: EGD 10/19/20 ([facility]): Impression:  - Grade I esophageal varices. - Portal hypertensive gastropathy. - Normal examined duodenum. - No specimens collected. Recommendation:  - Repeat upper endoscopy in 2-3 years for surveillance.   IMAGES: CXR 10/21/20: In process.  CT Super D Chest 10/08/20: IMPRESSION: 1. Slight interval enlargement of the right upper lobe sub solid nodular opacity. 2. No mediastinal or hilar mass or adenopathy. 3. Stable cirrhotic changes involving the liver with portal venous hypertension and portal venous collaterals. 4. Aortic atherosclerosis.  MRI Abd 05/13/20 ([facility]): Impression:   Hepatic findings:  1.  No definite suspicious liver lesion. Several subcentimeter LI-RADS 3  lesions.  2.  Cirrhosis with small upper abdominal varices.  3.  Patent hepatic and portal veins. Patent SMV.  Extrahepatic findings:  None.    CTA chest/abd/pelvis 04/22/20: IMPRESSION: 1. Ectatic but nonaneurysmal ascending thoracic aorta with a  maximal diameter of 3.8 cm. 2. There are 2 ground-glass attenuation nodular opacities in the right lung. The larger measures up to 1.2 cm and contains a central 3 mm solid component. The smaller lesion measures up to 0.7 cm in is positioned within the right lower lobe. Differential considerations include an active  infectious/inflammatory process versus low-grade adenocarcinoma. Non-contrast chest CT at 3-6 months is recommended. If nodules persist, subsequent management will be based upon the most suspicious nodule(s). This recommendation follows the consensus statement: Guidelines for Management of Incidental Pulmonary Nodules Detected on CT Images: From the Fleischner Society 2017; Radiology 2017; 284:228-243. 3. Cirrhotic liver containing multiple arterially enhancing nodules. These are incompletely characterized on this single-phase study. Differential considerations include regenerating nodules, dysplastic nodules, and hepatocellular carcinoma. Recommend further evaluation with gadolinium-enhanced MRI of the abdomen. 4. 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 This could also be further assessed with abdominal MRI. 5. 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   EKG: 10/21/20: NSR   CV: CT Cardiac Calcium Score 04/22/20: IMPRESSION: Coronary calcium score of 0. This is a low risk calcium score.  Echo 04/12/20: IMPRESSIONS   1. Left ventricular ejection fraction, by estimation, is 60 to 65%. Left  ventricular ejection fraction by 3D volume is 71 %. The left ventricle has  normal function. The left ventricle has no regional wall motion  abnormalities. There is mild left  ventricular hypertrophy. Left ventricular diastolic parameters were  normal. The average left ventricular global longitudinal strain is 19.5 %.  The global longitudinal strain is normal.   2. Right ventricular systolic function is normal. The right ventricular  size is normal. There is normal pulmonary artery systolic pressure.   3. Left atrial size was mildly dilated.   4. The mitral valve is normal in structure. Trivial mitral valve  regurgitation. No evidence of mitral stenosis.   5. The aortic valve is tricuspid. Aortic valve regurgitation is not  visualized. No aortic  stenosis is present.   6. Aortic dilatation noted. There is mild dilatation of the aortic root,  measuring 40 mm.   7. The inferior vena cava is normal in size with greater than 50%  respiratory variability, suggesting right atrial pressure of 3 mmHg.   Cardiac event monitor 12/08/14-01/06/15: Study Highlights Sinus Bradycardia as low as 42 bpm and Normal Sinus Rhythm. Average heart rate 59 bpm. Sinus Tachycardia up to 148 bpm during exercise. Atrial triplet Frequent PACs Nonsustained atrial tachycardia   Past Medical History:  Diagnosis Date   Acquired dilation of ascending aorta and aortic root (HCC)    28mm ascending aorta by CTA and echo 03/2020 and 20mm sinus of valsalva on echo   Anemia    Bradycardia 12/16/2014   Elevated LFTs    hepatitis virus was negative   Family history of aortic aneurysm    grandfather had aortic aneurysm repair and paternal uncle died of ruptured aortic aneurysm   Headache(784.0)    migraines   Hepatitis    autoimmune   Mitral valvular prolapse    posterior mitral vavlve leaflet with mild MR   Thyroid disease    Has a thyroid cyst    Past Surgical History:  Procedure Laterality Date   DILATATION & CURETTAGE/HYSTEROSCOPY WITH TRUECLEAR N/A 09/13/2012   Procedure: DILATATION & CURETTAGE/HYSTEROSCOPY WITH TRUECLEAR and THERMACHOICE;  Surgeon: Cyril Mourning, MD;  Location: [facility];  Service: Gynecology;  Laterality: N/A;   DILATATION & CURRETTAGE/HYSTEROSCOPY WITH RESECTOCOPE N/A 09/13/2012  Procedure: [place];  Surgeon: Cyril Mourning, MD;  Location: [facility];  Service: Gynecology;  Laterality: N/A;   DILATION AND CURETTAGE OF UTERUS     LIVER BIOPSY     x 2   UTERINE FIBROID SURGERY      MEDICATIONS:  INTRAROSA 6.5 MG INST   Multiple Vitamins-Iron (MULTI-VITAMIN/IRON) TABS   mycophenolate (MYFORTIC) 360 MG TBEC EC tablet   progesterone (PROMETRIUM) 100 MG capsule   ursodiol (ACTIGALL) 500  MG tablet   No current facility-administered medications for this encounter.    Myra Gianotti, PA-C Surgical Short Stay/Anesthesiology [facility] Phone [phone] [facility] Phone [phone] 10/22/2020 1:32 PM

## 2020-10-22 NOTE — Anesthesia Preprocedure Evaluation (Addendum)
Anesthesia Evaluation  Patient identified by MRN, date of birth, ID band Patient awake    Reviewed: Allergy & Precautions, NPO status , Patient's Chart, lab work & pertinent test results  Airway Mallampati: II  TM Distance: >3 FB Neck ROM: Full    Dental  (+) Dental Advisory Given   Pulmonary  Lung mass   breath sounds clear to auscultation       Cardiovascular negative cardio ROS   Rhythm:Regular Rate:Normal     Neuro/Psych negative neurological ROS     GI/Hepatic negative GI ROS, (+) Hepatitis -  Endo/Other  negative endocrine ROS  Renal/GU negative Renal ROS     Musculoskeletal   Abdominal   Peds  Hematology negative hematology ROS (+)   Anesthesia Other Findings   Reproductive/Obstetrics                            Anesthesia Physical Anesthesia Plan  ASA: 2  Anesthesia Plan: General   Post-op Pain Management:    Induction: Intravenous  PONV Risk Score and Plan: 3 and Midazolam, Dexamethasone, Ondansetron and Treatment may vary due to age or medical condition  Airway Management Planned: Oral ETT and Double Lumen EBT  Additional Equipment:   Intra-op Plan:   Post-operative Plan: Extubation in OR  Informed Consent: I have reviewed the patients History and Physical, chart, labs and discussed the procedure including the risks, benefits and alternatives for the proposed anesthesia with the patient or authorized representative who has indicated his/her understanding and acceptance.     Dental advisory given  Plan Discussed with: CRNA  Anesthesia Plan Comments: (PLAN GETA. 2 Large bore PIV's. Clear sight. Tube exchange catheter for conversion from single lumen ETT to double lumen EBT.  )      Anesthesia Quick Evaluation

## 2020-10-22 NOTE — Telephone Encounter (Signed)
"  A few questions before Monday. I appreciate the likelihood this pulmonary nodule is malignant but wanted to discuss treatment options if it would be granulomatous. When I saw Merilynn Finland, we were thinking I could not take most medications for an unusual infection but I contacted my hepatologist Charlean Sanfilippo at [facility]. He said in that case he would want you to determine your best recommendation for treatment and he would comment on the liver toxicity. He said no medications are off the table for me.  [facility] [phone]."   Dr. Valeta Harms please advise

## 2020-10-25 ENCOUNTER — Inpatient Hospital Stay (HOSPITAL_COMMUNITY): Payer: PRIVATE HEALTH INSURANCE | Admitting: Certified Registered Nurse Anesthetist

## 2020-10-25 ENCOUNTER — Encounter (HOSPITAL_COMMUNITY): Payer: Self-pay | Admitting: Pulmonary Disease

## 2020-10-25 ENCOUNTER — Inpatient Hospital Stay (HOSPITAL_COMMUNITY): Payer: PRIVATE HEALTH INSURANCE

## 2020-10-25 ENCOUNTER — Encounter (HOSPITAL_COMMUNITY)
Admission: RE | Disposition: A | Discharge status: Home / Self Care | Payer: Self-pay | Source: Home / Self Care | Attending: Thoracic Surgery (Cardiothoracic Vascular Surgery)

## 2020-10-25 ENCOUNTER — Inpatient Hospital Stay (HOSPITAL_COMMUNITY)
Admission: RE | Admit: 2020-10-25 | Discharge: 2020-10-30 | DRG: 164 | Disposition: A | Discharge status: Home / Self Care | Payer: PRIVATE HEALTH INSURANCE | Attending: Thoracic Surgery (Cardiothoracic Vascular Surgery) | Admitting: Thoracic Surgery (Cardiothoracic Vascular Surgery)

## 2020-10-25 ENCOUNTER — Inpatient Hospital Stay (HOSPITAL_COMMUNITY): Payer: PRIVATE HEALTH INSURANCE | Admitting: Vascular Surgery

## 2020-10-25 ENCOUNTER — Other Ambulatory Visit: Payer: Self-pay

## 2020-10-25 DIAGNOSIS — Z8616 Personal history of COVID-19: Secondary | ICD-10-CM

## 2020-10-25 DIAGNOSIS — D62 Acute posthemorrhagic anemia: Secondary | ICD-10-CM | POA: Diagnosis not present

## 2020-10-25 DIAGNOSIS — R911 Solitary pulmonary nodule: Secondary | ICD-10-CM | POA: Diagnosis present

## 2020-10-25 DIAGNOSIS — I341 Nonrheumatic mitral (valve) prolapse: Secondary | ICD-10-CM | POA: Diagnosis present

## 2020-10-25 DIAGNOSIS — Z79624 Long term (current) use of inhibitors of nucleotide synthesis: Secondary | ICD-10-CM | POA: Diagnosis not present

## 2020-10-25 DIAGNOSIS — D696 Thrombocytopenia, unspecified: Secondary | ICD-10-CM | POA: Diagnosis not present

## 2020-10-25 DIAGNOSIS — J9382 Other air leak: Secondary | ICD-10-CM | POA: Diagnosis not present

## 2020-10-25 DIAGNOSIS — T8182XA Emphysema (subcutaneous) resulting from a procedure, initial encounter: Secondary | ICD-10-CM | POA: Diagnosis not present

## 2020-10-25 DIAGNOSIS — C3411 Malignant neoplasm of upper lobe, right bronchus or lung: Secondary | ICD-10-CM

## 2020-10-25 DIAGNOSIS — Z8249 Family history of ischemic heart disease and other diseases of the circulatory system: Secondary | ICD-10-CM | POA: Diagnosis not present

## 2020-10-25 DIAGNOSIS — I77810 Thoracic aortic ectasia: Secondary | ICD-10-CM | POA: Diagnosis present

## 2020-10-25 DIAGNOSIS — Y839 Surgical procedure, unspecified as the cause of abnormal reaction of the patient, or of later complication, without mention of misadventure at the time of the procedure: Secondary | ICD-10-CM | POA: Diagnosis not present

## 2020-10-25 DIAGNOSIS — Z902 Acquired absence of lung [part of]: Secondary | ICD-10-CM

## 2020-10-25 DIAGNOSIS — C801 Malignant (primary) neoplasm, unspecified: Secondary | ICD-10-CM

## 2020-10-25 DIAGNOSIS — Z419 Encounter for procedure for purposes other than remedying health state, unspecified: Secondary | ICD-10-CM

## 2020-10-25 DIAGNOSIS — Z801 Family history of malignant neoplasm of trachea, bronchus and lung: Secondary | ICD-10-CM

## 2020-10-25 DIAGNOSIS — K5900 Constipation, unspecified: Secondary | ICD-10-CM | POA: Diagnosis not present

## 2020-10-25 DIAGNOSIS — K754 Autoimmune hepatitis: Secondary | ICD-10-CM | POA: Diagnosis present

## 2020-10-25 DIAGNOSIS — K219 Gastro-esophageal reflux disease without esophagitis: Secondary | ICD-10-CM | POA: Diagnosis present

## 2020-10-25 DIAGNOSIS — Z79899 Other long term (current) drug therapy: Secondary | ICD-10-CM

## 2020-10-25 DIAGNOSIS — E871 Hypo-osmolality and hyponatremia: Secondary | ICD-10-CM | POA: Diagnosis not present

## 2020-10-25 DIAGNOSIS — Z09 Encounter for follow-up examination after completed treatment for conditions other than malignant neoplasm: Secondary | ICD-10-CM

## 2020-10-25 DIAGNOSIS — K766 Portal hypertension: Secondary | ICD-10-CM | POA: Diagnosis present

## 2020-10-25 DIAGNOSIS — D84821 Immunodeficiency due to drugs: Secondary | ICD-10-CM | POA: Diagnosis present

## 2020-10-25 DIAGNOSIS — K743 Primary biliary cirrhosis: Secondary | ICD-10-CM | POA: Diagnosis present

## 2020-10-25 DIAGNOSIS — J939 Pneumothorax, unspecified: Secondary | ICD-10-CM

## 2020-10-25 HISTORY — PX: SUBMUCOSAL TATTOO INJECTION: SHX6856

## 2020-10-25 HISTORY — PX: BRONCHIAL BIOPSY: SHX5109

## 2020-10-25 HISTORY — DX: Malignant (primary) neoplasm, unspecified: C80.1

## 2020-10-25 HISTORY — PX: VIDEO BRONCHOSCOPY WITH ENDOBRONCHIAL NAVIGATION: SHX6175

## 2020-10-25 HISTORY — PX: INTERCOSTAL NERVE BLOCK: SHX5021

## 2020-10-25 HISTORY — PX: LYMPH NODE DISSECTION: SHX5087

## 2020-10-25 HISTORY — PX: BRONCHIAL BRUSHINGS: SHX5108

## 2020-10-25 LAB — ABO/RH: ABO/RH(D): A POS

## 2020-10-25 SURGERY — WEDGE RESECTION, LUNG, ROBOT-ASSISTED, THORACOSCOPIC
Anesthesia: General | Site: Chest | Laterality: Right

## 2020-10-25 SURGERY — VIDEO BRONCHOSCOPY WITH ENDOBRONCHIAL NAVIGATION
Anesthesia: General | Laterality: Right

## 2020-10-25 MED ORDER — PROPOFOL 10 MG/ML IV BOLUS
INTRAVENOUS | Status: DC | PRN
  Administered 2020-10-25: 30 mg via INTRAVENOUS
  Administered 2020-10-25: 120 mg via INTRAVENOUS

## 2020-10-25 MED ORDER — FENTANYL CITRATE (PF) 250 MCG/5ML IJ SOLN
INTRAMUSCULAR | Status: AC
  Filled 2020-10-25: qty 5

## 2020-10-25 MED ORDER — TRAMADOL HCL 50 MG PO TABS
50.0000 mg | ORAL_TABLET | Freq: Four times a day (QID) | ORAL | Status: DC | PRN
  Administered 2020-10-26 – 2020-10-30 (×13): 100 mg via ORAL
  Filled 2020-10-25 (×13): qty 2

## 2020-10-25 MED ORDER — FENTANYL CITRATE (PF) 100 MCG/2ML IJ SOLN
INTRAMUSCULAR | Status: AC
  Administered 2020-10-25: 25 ug via INTRAVENOUS
  Filled 2020-10-25: qty 2

## 2020-10-25 MED ORDER — PROPOFOL 500 MG/50ML IV EMUL
INTRAVENOUS | Status: DC | PRN
  Administered 2020-10-25: 75 ug/kg/min via INTRAVENOUS

## 2020-10-25 MED ORDER — ROCURONIUM BROMIDE 10 MG/ML (PF) SYRINGE
PREFILLED_SYRINGE | INTRAVENOUS | Status: DC | PRN
  Administered 2020-10-25: 40 mg via INTRAVENOUS
  Administered 2020-10-25: 10 mg via INTRAVENOUS
  Administered 2020-10-25: 50 mg via INTRAVENOUS
  Administered 2020-10-25: 20 mg via INTRAVENOUS
  Administered 2020-10-25: 50 mg via INTRAVENOUS

## 2020-10-25 MED ORDER — SODIUM CHLORIDE FLUSH 0.9 % IV SOLN: INTRAVENOUS | Status: DC | PRN

## 2020-10-25 MED ORDER — DEXTROSE-NACL 5-0.9 % IV SOLN: INTRAVENOUS | Status: DC

## 2020-10-25 MED ORDER — AMISULPRIDE (ANTIEMETIC) 5 MG/2ML IV SOLN: 10.0000 mg | Freq: Once | INTRAVENOUS | Status: DC | PRN

## 2020-10-25 MED ORDER — PROPOFOL 10 MG/ML IV BOLUS
INTRAVENOUS | Status: AC
  Filled 2020-10-25: qty 20

## 2020-10-25 MED ORDER — ACETAMINOPHEN 500 MG PO TABS
1000.0000 mg | ORAL_TABLET | Freq: Four times a day (QID) | ORAL | Status: DC
  Administered 2020-10-26 – 2020-10-30 (×16): 1000 mg via ORAL
  Filled 2020-10-25 (×18): qty 2

## 2020-10-25 MED ORDER — BUPIVACAINE LIPOSOME 1.3 % IJ SUSP
INTRAMUSCULAR | Status: AC
  Filled 2020-10-25: qty 20

## 2020-10-25 MED ORDER — SENNOSIDES-DOCUSATE SODIUM 8.6-50 MG PO TABS
1.0000 | ORAL_TABLET | Freq: Every day | ORAL | Status: DC
  Administered 2020-10-25 – 2020-10-29 (×5): 1 via ORAL
  Filled 2020-10-25 (×5): qty 1

## 2020-10-25 MED ORDER — PRASTERONE 6.5 MG VA INST: 1.0000 | VAGINAL_INSERT | Freq: Every day | VAGINAL | Status: DC

## 2020-10-25 MED ORDER — BISACODYL 5 MG PO TBEC
10.0000 mg | DELAYED_RELEASE_TABLET | Freq: Every day | ORAL | Status: DC
  Administered 2020-10-25 – 2020-10-29 (×5): 10 mg via ORAL
  Filled 2020-10-25 (×6): qty 2

## 2020-10-25 MED ORDER — SODIUM CHLORIDE 0.9 % IV SOLN
INTRAVENOUS | Status: AC | PRN
  Administered 2020-10-25: 1000 mL via INTRAMUSCULAR

## 2020-10-25 MED ORDER — TAB-A-VITE/IRON PO TABS
1.0000 | ORAL_TABLET | Freq: Every day | ORAL | Status: DC
  Administered 2020-10-26 – 2020-10-30 (×5): 1 via ORAL
  Filled 2020-10-25 (×5): qty 1

## 2020-10-25 MED ORDER — KETOROLAC TROMETHAMINE 30 MG/ML IJ SOLN
30.0000 mg | Freq: Three times a day (TID) | INTRAMUSCULAR | Status: DC
  Administered 2020-10-26: 30 mg via INTRAVENOUS
  Filled 2020-10-25: qty 1

## 2020-10-25 MED ORDER — MIDAZOLAM HCL 2 MG/2ML IJ SOLN
INTRAMUSCULAR | Status: DC | PRN
  Administered 2020-10-25: 2 mg via INTRAVENOUS

## 2020-10-25 MED ORDER — 0.9 % SODIUM CHLORIDE (POUR BTL) OPTIME
TOPICAL | Status: DC | PRN
  Administered 2020-10-25: 2000 mL

## 2020-10-25 MED ORDER — DEXAMETHASONE SODIUM PHOSPHATE 10 MG/ML IJ SOLN
INTRAMUSCULAR | Status: DC | PRN
Start: 2020-10-25 — End: 2020-10-25
  Administered 2020-10-25 (×2): 5 mg via INTRAVENOUS

## 2020-10-25 MED ORDER — ACETAMINOPHEN 10 MG/ML IV SOLN
INTRAVENOUS | Status: DC | PRN
  Administered 2020-10-25: 1000 mg via INTRAVENOUS

## 2020-10-25 MED ORDER — MYCOPHENOLATE SODIUM 180 MG PO TBEC
720.0000 mg | DELAYED_RELEASE_TABLET | Freq: Two times a day (BID) | ORAL | Status: DC
  Administered 2020-10-27 – 2020-10-29 (×4): 720 mg via ORAL
  Filled 2020-10-25 (×3): qty 4

## 2020-10-25 MED ORDER — ORAL CARE MOUTH RINSE: 15.0000 mL | Freq: Once | OROMUCOSAL | Status: AC

## 2020-10-25 MED ORDER — HYDROMORPHONE HCL 1 MG/ML IJ SOLN
INTRAMUSCULAR | Status: DC | PRN
  Administered 2020-10-25: 1 mg via INTRAVENOUS

## 2020-10-25 MED ORDER — CEFAZOLIN SODIUM-DEXTROSE 2-4 GM/100ML-% IV SOLN
2.0000 g | INTRAVENOUS | Status: AC
  Administered 2020-10-25: 2 g via INTRAVENOUS

## 2020-10-25 MED ORDER — MIDAZOLAM HCL 2 MG/2ML IJ SOLN
INTRAMUSCULAR | Status: AC
  Filled 2020-10-25: qty 2

## 2020-10-25 MED ORDER — PROGESTERONE MICRONIZED 100 MG PO CAPS
100.0000 mg | ORAL_CAPSULE | Freq: Every day | ORAL | Status: DC
  Administered 2020-10-26 – 2020-10-30 (×5): 100 mg via ORAL
  Filled 2020-10-25 (×5): qty 1

## 2020-10-25 MED ORDER — DEXMEDETOMIDINE (PRECEDEX) IN NS 20 MCG/5ML (4 MCG/ML) IV SYRINGE
PREFILLED_SYRINGE | INTRAVENOUS | Status: DC | PRN
  Administered 2020-10-25: 8 ug via INTRAVENOUS
  Administered 2020-10-25: 20 ug via INTRAVENOUS
  Administered 2020-10-25: 4 ug via INTRAVENOUS

## 2020-10-25 MED ORDER — DEXMEDETOMIDINE (PRECEDEX) IN NS 20 MCG/5ML (4 MCG/ML) IV SYRINGE
PREFILLED_SYRINGE | INTRAVENOUS | Status: AC
  Filled 2020-10-25: qty 10

## 2020-10-25 MED ORDER — FENTANYL CITRATE (PF) 250 MCG/5ML IJ SOLN
INTRAMUSCULAR | Status: DC | PRN
  Administered 2020-10-25 (×5): 50 ug via INTRAVENOUS

## 2020-10-25 MED ORDER — FENTANYL CITRATE PF 50 MCG/ML IJ SOSY
25.0000 ug | PREFILLED_SYRINGE | INTRAMUSCULAR | Status: DC | PRN
  Administered 2020-10-25 – 2020-10-26 (×4): 50 ug via INTRAVENOUS
  Filled 2020-10-25 (×4): qty 1

## 2020-10-25 MED ORDER — HYDROMORPHONE HCL 1 MG/ML IJ SOLN
INTRAMUSCULAR | Status: AC
  Filled 2020-10-25: qty 1

## 2020-10-25 MED ORDER — BUPIVACAINE HCL (PF) 0.5 % IJ SOLN
INTRAMUSCULAR | Status: AC
  Filled 2020-10-25: qty 30

## 2020-10-25 MED ORDER — FENTANYL CITRATE (PF) 100 MCG/2ML IJ SOLN
25.0000 ug | INTRAMUSCULAR | Status: DC | PRN
  Administered 2020-10-25 (×4): 25 ug via INTRAVENOUS

## 2020-10-25 MED ORDER — ACETAMINOPHEN 10 MG/ML IV SOLN
INTRAVENOUS | Status: AC
  Filled 2020-10-25: qty 100

## 2020-10-25 MED ORDER — OXYCODONE HCL 5 MG PO TABS
ORAL_TABLET | ORAL | Status: AC
  Filled 2020-10-25: qty 1

## 2020-10-25 MED ORDER — ALBUTEROL SULFATE (2.5 MG/3ML) 0.083% IN NEBU: 2.5000 mg | INHALATION_SOLUTION | Freq: Four times a day (QID) | RESPIRATORY_TRACT | Status: DC | PRN

## 2020-10-25 MED ORDER — PHENYLEPHRINE 40 MCG/ML (10ML) SYRINGE FOR IV PUSH (FOR BLOOD PRESSURE SUPPORT)
PREFILLED_SYRINGE | INTRAVENOUS | Status: DC | PRN
  Administered 2020-10-25: 160 ug via INTRAVENOUS
  Administered 2020-10-25: 120 ug via INTRAVENOUS

## 2020-10-25 MED ORDER — ALBUTEROL SULFATE (2.5 MG/3ML) 0.083% IN NEBU
2.5000 mg | INHALATION_SOLUTION | RESPIRATORY_TRACT | Status: DC
  Filled 2020-10-25: qty 3

## 2020-10-25 MED ORDER — ONDANSETRON HCL 4 MG/2ML IJ SOLN
INTRAMUSCULAR | Status: DC | PRN
  Administered 2020-10-25: 4 mg via INTRAVENOUS

## 2020-10-25 MED ORDER — CEFAZOLIN SODIUM-DEXTROSE 2-4 GM/100ML-% IV SOLN
INTRAVENOUS | Status: AC
  Filled 2020-10-25: qty 100

## 2020-10-25 MED ORDER — CHLORHEXIDINE GLUCONATE 0.12 % MT SOLN
15.0000 mL | Freq: Once | OROMUCOSAL | Status: AC
  Administered 2020-10-25: 15 mL via OROMUCOSAL
  Filled 2020-10-25 (×2): qty 15

## 2020-10-25 MED ORDER — LACTATED RINGERS IV SOLN: INTRAVENOUS | Status: DC | PRN

## 2020-10-25 MED ORDER — URSODIOL 500 MG PO TABS
500.0000 mg | ORAL_TABLET | Freq: Two times a day (BID) | ORAL | Status: DC
  Administered 2020-10-28 – 2020-10-30 (×5): 500 mg via ORAL

## 2020-10-25 MED ORDER — PHENYLEPHRINE HCL-NACL 20-0.9 MG/250ML-% IV SOLN
INTRAVENOUS | Status: DC | PRN
  Administered 2020-10-25: 20 ug/min via INTRAVENOUS

## 2020-10-25 MED ORDER — ACETAMINOPHEN 160 MG/5ML PO SOLN: 1000.0000 mg | Freq: Four times a day (QID) | ORAL | Status: DC

## 2020-10-25 MED ORDER — SUGAMMADEX SODIUM 200 MG/2ML IV SOLN
INTRAVENOUS | Status: DC | PRN
  Administered 2020-10-25: 200 mg via INTRAVENOUS

## 2020-10-25 MED ORDER — ENOXAPARIN SODIUM 40 MG/0.4ML IJ SOSY
40.0000 mg | PREFILLED_SYRINGE | INTRAMUSCULAR | Status: DC
  Administered 2020-10-26 – 2020-10-30 (×5): 40 mg via SUBCUTANEOUS
  Filled 2020-10-25 (×5): qty 0.4

## 2020-10-25 MED ORDER — OXYCODONE HCL 5 MG PO TABS
5.0000 mg | ORAL_TABLET | ORAL | Status: DC | PRN
  Administered 2020-10-25: 5 mg via ORAL
  Administered 2020-10-26 – 2020-10-29 (×5): 10 mg via ORAL
  Filled 2020-10-25: qty 2
  Filled 2020-10-25: qty 1
  Filled 2020-10-25 (×4): qty 2

## 2020-10-25 MED ORDER — LIDOCAINE 2% (20 MG/ML) 5 ML SYRINGE
INTRAMUSCULAR | Status: DC | PRN
  Administered 2020-10-25: 60 mg via INTRAVENOUS

## 2020-10-25 MED ORDER — CEFAZOLIN SODIUM-DEXTROSE 2-4 GM/100ML-% IV SOLN
2.0000 g | Freq: Three times a day (TID) | INTRAVENOUS | Status: AC
  Administered 2020-10-25 – 2020-10-26 (×2): 2 g via INTRAVENOUS
  Filled 2020-10-25 (×2): qty 100

## 2020-10-25 MED ORDER — LACTATED RINGERS IV SOLN: INTRAVENOUS | Status: DC

## 2020-10-25 MED ORDER — KETOROLAC TROMETHAMINE 30 MG/ML IJ SOLN
INTRAMUSCULAR | Status: AC
  Filled 2020-10-25: qty 1

## 2020-10-25 MED ORDER — ALBUMIN HUMAN 5 % IV SOLN
INTRAVENOUS | Status: DC | PRN
Start: 2020-10-25 — End: 2020-10-25

## 2020-10-25 MED ORDER — EPHEDRINE SULFATE-NACL 50-0.9 MG/10ML-% IV SOSY
PREFILLED_SYRINGE | INTRAVENOUS | Status: DC | PRN
  Administered 2020-10-25 (×2): 10 mg via INTRAVENOUS

## 2020-10-25 MED ORDER — HEMOSTATIC AGENTS (NO CHARGE) OPTIME
TOPICAL | Status: DC | PRN
  Administered 2020-10-25: 1 via TOPICAL

## 2020-10-25 MED ORDER — FENTANYL CITRATE (PF) 100 MCG/2ML IJ SOLN
INTRAMUSCULAR | Status: AC
  Filled 2020-10-25: qty 2

## 2020-10-25 MED ORDER — METHYLENE BLUE 0.5 % INJ SOLN
INTRAVENOUS | Status: DC | PRN
  Administered 2020-10-25: 1 mL

## 2020-10-25 MED ORDER — KETOROLAC TROMETHAMINE 30 MG/ML IJ SOLN
INTRAMUSCULAR | Status: AC
  Administered 2020-10-25: 30 mg via INTRAVENOUS
  Filled 2020-10-25: qty 1

## 2020-10-25 MED ORDER — ONDANSETRON HCL 4 MG/2ML IJ SOLN: 4.0000 mg | Freq: Four times a day (QID) | INTRAMUSCULAR | Status: DC | PRN

## 2020-10-25 SURGICAL SUPPLY — 46 items

## 2020-10-25 SURGICAL SUPPLY — 102 items
ADH SKN CLS APL DERMABOND .7 (GAUZE/BANDAGES/DRESSINGS) ×1
ADH SKN CLS LQ APL DERMABOND (GAUZE/BANDAGES/DRESSINGS) ×1
BAG SPEC RTRVL C125 8X14 (MISCELLANEOUS) ×1
BAG TISS RTRVL C300 12X14 (MISCELLANEOUS) ×1
BLADE CLIPPER SURG (BLADE) ×2 IMPLANT
CANISTER SUCT 3000ML PPV (MISCELLANEOUS) ×4 IMPLANT
CANNULA REDUC XI 12-8 STAPL (CANNULA) ×4
CANNULA REDUCER 12-8 DVNC XI (CANNULA) ×2 IMPLANT
CATH THORACIC 28FR RT ANG (CATHETERS) IMPLANT
CATH THORACIC 36FR RT ANG (CATHETERS) IMPLANT
CLIP VESOCCLUDE MED 6/CT (CLIP) IMPLANT
CNTNR URN SCR LID CUP LEK RST (MISCELLANEOUS) ×5 IMPLANT
CONN ST 1/4X3/8  BEN (MISCELLANEOUS) ×2
CONN ST 1/4X3/8 BEN (MISCELLANEOUS) IMPLANT
CONT SPEC 4OZ STRL OR WHT (MISCELLANEOUS) ×30
DEFOGGER SCOPE WARMER CLEARIFY (MISCELLANEOUS) ×2 IMPLANT
DERMABOND ADHESIVE PROPEN (GAUZE/BANDAGES/DRESSINGS) ×1
DERMABOND ADVANCED (GAUZE/BANDAGES/DRESSINGS) ×1
DERMABOND ADVANCED .7 DNX12 (GAUZE/BANDAGES/DRESSINGS) ×1 IMPLANT
DERMABOND ADVANCED .7 DNX6 (GAUZE/BANDAGES/DRESSINGS) IMPLANT
DRAIN CHANNEL 28F RND 3/8 FF (WOUND CARE) IMPLANT
DRAPE ARM DVNC X/XI (DISPOSABLE) ×4 IMPLANT
DRAPE COLUMN DVNC XI (DISPOSABLE) ×1 IMPLANT
DRAPE CV SPLIT W-CLR ANES SCRN (DRAPES) ×2 IMPLANT
DRAPE DA VINCI XI ARM (DISPOSABLE) ×8
DRAPE DA VINCI XI COLUMN (DISPOSABLE) ×2
DRAPE HALF SHEET 40X57 (DRAPES) ×3 IMPLANT
DRAPE INCISE IOBAN 66X45 STRL (DRAPES) ×1 IMPLANT
DRAPE ORTHO SPLIT 77X108 STRL (DRAPES) ×2
DRAPE SURG ORHT 6 SPLT 77X108 (DRAPES) ×1 IMPLANT
ELECT BLADE 6.5 EXT (BLADE) IMPLANT
ELECT REM PT RETURN 9FT ADLT (ELECTROSURGICAL) ×2
ELECTRODE REM PT RTRN 9FT ADLT (ELECTROSURGICAL) ×1 IMPLANT
GAUZE KITTNER 4X5 RF (MISCELLANEOUS) ×5 IMPLANT
GAUZE SPONGE 4X4 12PLY STRL (GAUZE/BANDAGES/DRESSINGS) ×2 IMPLANT
GLOVE TRIUMPH SURG SIZE 7.5 (KITS) ×4 IMPLANT
GOWN STRL REUS W/ TWL LRG LVL3 (GOWN DISPOSABLE) ×2 IMPLANT
GOWN STRL REUS W/ TWL XL LVL3 (GOWN DISPOSABLE) ×2 IMPLANT
GOWN STRL REUS W/TWL 2XL LVL3 (GOWN DISPOSABLE) ×2 IMPLANT
GOWN STRL REUS W/TWL LRG LVL3 (GOWN DISPOSABLE) ×4
GOWN STRL REUS W/TWL XL LVL3 (GOWN DISPOSABLE) ×4
HEMOSTAT SURGICEL 2X14 (HEMOSTASIS) ×5 IMPLANT
IRRIGATION STRYKERFLOW (MISCELLANEOUS) ×1 IMPLANT
IRRIGATOR STRYKERFLOW (MISCELLANEOUS) ×2
KIT BASIN OR (CUSTOM PROCEDURE TRAY) ×2 IMPLANT
KIT SUCTION CATH 14FR (SUCTIONS) IMPLANT
KIT TURNOVER KIT B (KITS) ×2 IMPLANT
NDL HYPO 25GX1X1/2 BEV (NEEDLE) ×1 IMPLANT
NDL SPNL 22GX3.5 QUINCKE BK (NEEDLE) ×1 IMPLANT
NEEDLE HYPO 25GX1X1/2 BEV (NEEDLE) ×2 IMPLANT
NEEDLE SPNL 22GX3.5 QUINCKE BK (NEEDLE) ×2 IMPLANT
NS IRRIG 1000ML POUR BTL (IV SOLUTION) ×2 IMPLANT
PACK CHEST (CUSTOM PROCEDURE TRAY) ×2 IMPLANT
PAD ARMBOARD 7.5X6 YLW CONV (MISCELLANEOUS) ×4 IMPLANT
RELOAD STAPLE 45 2.0 GRY DVNC (STAPLE) IMPLANT
RELOAD STAPLE 45 3.5 BLU DVNC (STAPLE) IMPLANT
RELOAD STAPLE 45 4.3 GRN DVNC (STAPLE) IMPLANT
RELOAD STAPLE 60 2.5 WHT DVNC (STAPLE) IMPLANT
RELOAD STAPLER 2.5X45 WHT DVNC (STAPLE) ×3 IMPLANT
RELOAD STAPLER 2.5X60 WHT DVNC (STAPLE) IMPLANT
RELOAD STAPLER 3.5X45 BLU DVNC (STAPLE) ×13 IMPLANT
RELOAD STAPLER 4.3X45 GRN DVNC (STAPLE) ×7 IMPLANT
SEAL CANN UNIV 5-8 DVNC XI (MISCELLANEOUS) ×2 IMPLANT
SEAL XI 5MM-8MM UNIVERSAL (MISCELLANEOUS) ×4
SEALER SYNCHRO 8 IS4000 DV (MISCELLANEOUS) ×2
SEALER SYNCHRO 8 IS4000 DVNC (MISCELLANEOUS) IMPLANT
SET TRI-LUMEN FLTR TB AIRSEAL (TUBING) ×2 IMPLANT
SOLUTION ELECTROLUBE (MISCELLANEOUS) ×2 IMPLANT
SPONGE INTESTINAL PEANUT (DISPOSABLE) IMPLANT
STAPLE RELOAD 45 2.0 GRAY (STAPLE) ×2
STAPLE RELOAD 45 2.0 GRAY DVNC (STAPLE) ×1 IMPLANT
STAPLER 45 SUREFORM CVD (STAPLE) ×4
STAPLER 45 SUREFORM CVD DVNC (STAPLE) IMPLANT
STAPLER CANNULA SEAL DVNC XI (STAPLE) ×2 IMPLANT
STAPLER CANNULA SEAL XI (STAPLE) ×4
STAPLER RELOAD 2.5X45 WHITE (STAPLE) ×6
STAPLER RELOAD 2.5X45 WHT DVNC (STAPLE) ×3
STAPLER RELOAD 2.5X60 WHITE (STAPLE)
STAPLER RELOAD 2.5X60 WHT DVNC (STAPLE)
STAPLER RELOAD 3.5X45 BLU DVNC (STAPLE) ×13
STAPLER RELOAD 3.5X45 BLUE (STAPLE) ×26
STAPLER RELOAD 4.3X45 GREEN (STAPLE) ×14
STAPLER RELOAD 4.3X45 GRN DVNC (STAPLE) ×7
SUT PROLENE 4 0 RB 1 (SUTURE)
SUT PROLENE 4-0 RB1 .5 CRCL 36 (SUTURE) IMPLANT
SUT SILK  1 MH (SUTURE) ×4
SUT SILK 1 MH (SUTURE) ×2 IMPLANT
SUT SILK 1 TIES 10X30 (SUTURE) ×1 IMPLANT
SUT SILK 3 0SH CR/8 30 (SUTURE) ×1 IMPLANT
SUT VIC AB 1 CTX 36 (SUTURE) ×2
SUT VIC AB 1 CTX36XBRD ANBCTR (SUTURE) IMPLANT
SUT VIC AB 3-0 X1 27 (SUTURE) ×4 IMPLANT
SUT VICRYL 0 TIES 12 18 (SUTURE) ×2 IMPLANT
SUT VICRYL 0 UR6 27IN ABS (SUTURE) ×4 IMPLANT
SYR 20ML LL LF (SYRINGE) ×4 IMPLANT
SYSTEM RETRIEVAL ANCHOR 12 (MISCELLANEOUS) ×1 IMPLANT
SYSTEM RETRIEVAL ANCHOR 8 (MISCELLANEOUS) ×1 IMPLANT
SYSTEM SAHARA CHEST DRAIN ATS (WOUND CARE) ×2 IMPLANT
TAPE CLOTH 4X10 WHT NS (GAUZE/BANDAGES/DRESSINGS) ×2 IMPLANT
TOWEL GREEN STERILE (TOWEL DISPOSABLE) ×4 IMPLANT
TRAY FOLEY MTR SLVR 16FR STAT (SET/KITS/TRAYS/PACK) ×2 IMPLANT
WATER STERILE IRR 1000ML POUR (IV SOLUTION) ×2 IMPLANT

## 2020-10-25 NOTE — Interval H&P Note (Signed)
History and Physical Interval Note:  10/25/2020 12:55 PM  Barbara Knight  has presented today for surgery, with the diagnosis of lung nodule.  The various methods of treatment have been discussed with the patient and family. After consideration of risks, benefits and other options for treatment, the patient has consented to  Procedure(s) with comments: Baker (Right) - ION + Fiducial Dye Marking as a surgical intervention.  The patient's history has been reviewed, patient examined, no change in status, stable for surgery.  I have reviewed the patient's chart and labs.  Questions were answered to the patient's satisfaction.     [place]

## 2020-10-25 NOTE — Brief Op Note (Signed)
10/25/2020  7:45 AM  PATIENT:  Barbara Knight  55 y.o. female  PRE-OPERATIVE DIAGNOSIS:  RIGHT UPPER LOBE LUNG NODULE  POST-OPERATIVE DIAGNOSIS:  RIGHT UPPER LOBE LUNG NODULE  PROCEDURE:  Procedure(s): XI ROBOTIC ASSISTED RIGHT UPPER LOBE THORASCOPY-RIGHT UPPER LOBE WEDGE RESECTION AND RIGHT UPPER LOBECTOMY (Right) LYMPH NODE DISSECTION (Right) INTERCOSTAL NERVE BLOCK (Right)  SURGEON:  Surgeon(s) and Role:    * [name], MD - Primary  PHYSICIAN ASSISTANT: Isaiyah Feldhaus PA-C, ERIN BARRETT PA-C  ANESTHESIA:   general  EBL:  200 mL   BLOOD ADMINISTERED:none  DRAINS:  CHEST TUBE IN RIGHT HEMITHORAX    LOCAL MEDICATIONS USED:  OTHER EXPAREL  SPECIMEN:  Source of Specimen:  RUL WEDGE, COMPLETION LOBECTOMY, MULTIPLE LN SAMPLES   DISPOSITION OF SPECIMEN:  PATHOLOGY  COUNTS:  YES  TOURNIQUET:  * No tourniquets in log *  DICTATION: .Other Dictation: Dictation Number PENDING  PLAN OF CARE: Admit to inpatient   PATIENT DISPOSITION:  PACU - hemodynamically stable.   Delay start of Pharmacological VTE agent (>24hrs) due to surgical blood loss or risk of bleeding: NO  FROZEN: ADENOCARCINOMA  COMPLICATIONS: NO KNOWN

## 2020-10-25 NOTE — Anesthesia Procedure Notes (Signed)
Procedure Name: Intubation Date/Time: 10/25/2020 1:35 PM Performed by: Janace Litten, CRNA Pre-anesthesia Checklist: Patient identified, Emergency Drugs available, Suction available and Patient being monitored Patient Re-evaluated:Patient Re-evaluated prior to induction Oxygen Delivery Method: Circle System Utilized Preoxygenation: Pre-oxygenation with 100% oxygen Induction Type: IV induction Ventilation: Mask ventilation without difficulty Laryngoscope Size: Mac and 3 Grade View: Grade I Tube type: Oral Tube size: 8.5 mm Number of attempts: 1 Airway Equipment and Method: Stylet and Oral airway Placement Confirmation: ETT inserted through vocal cords under direct vision, positive ETCO2 and breath sounds checked- equal and bilateral Secured at: 20 cm Tube secured with: Tape Dental Injury: Teeth and Oropharynx as per pre-operative assessment

## 2020-10-25 NOTE — Anesthesia Procedure Notes (Signed)
Procedure Name: Intubation Date/Time: 10/25/2020 2:24 PM Performed by: Janace Litten, CRNA Pre-anesthesia Checklist: Patient identified, Emergency Drugs available, Suction available and Patient being monitored Patient Re-evaluated:Patient Re-evaluated prior to induction Oxygen Delivery Method: Circle System Utilized Preoxygenation: Pre-oxygenation with 100% oxygen Induction Type: IV induction Ventilation: Mask ventilation without difficulty Laryngoscope Size: Mac and 3 Grade View: Grade I Tube type: Oral Endobronchial tube: Left, Double lumen EBT, EBT position confirmed by auscultation and EBT position confirmed by fiberoptic bronchoscope and 37 Fr Number of attempts: 1 Airway Equipment and Method: Stylet and Fiberoptic brochoscope (cooke exchange catheter used vial DL) Placement Confirmation: ETT inserted through vocal cords under direct vision, positive ETCO2 and breath sounds checked- equal and bilateral Tube secured with: Tape Dental Injury: Teeth and Oropharynx as per pre-operative assessment

## 2020-10-25 NOTE — Interval H&P Note (Signed)
History and Physical Interval Note:  10/25/2020 12:26 PM  Barbara Knight  has presented today for surgery, with the diagnosis of RUL LUNG NODULE.  The various methods of treatment have been discussed with the patient and family. After consideration of risks, benefits and other options for treatment, the patient has consented to  Procedure(s): XI ROBOTIC ASSISTED THORASCOPY-WEDGE RESECTION, possible segmentectomy, possible lobectomy (Right) as a surgical intervention.  The patient's history has been reviewed, patient examined, no change in status, stable for surgery.  I have reviewed the patient's chart and labs.  Questions were answered to the patient's satisfaction.     [name]

## 2020-10-25 NOTE — Plan of Care (Signed)

## 2020-10-25 NOTE — Transfer of Care (Signed)
Immediate Anesthesia Transfer of Care Note  Patient: Barbara Knight  Procedure(s) Performed: XI ROBOTIC ASSISTED RIGHT UPPER LOBE THORASCOPY-RIGHT UPPER LOBE WEDGE RESECTION AND RIGHT UPPER LOBECTOMY (Right: Chest) LYMPH NODE DISSECTION (Right: Chest) INTERCOSTAL NERVE BLOCK (Right: Chest)  Patient Location: PACU  Anesthesia Type:General  Level of Consciousness: drowsy, patient cooperative and responds to stimulation  Airway & Oxygen Therapy: Patient Spontanous Breathing  Post-op Assessment: Report given to RN and Post -op Vital signs reviewed and stable  Post vital signs: Reviewed and stable  Last Vitals:  Vitals Value Taken Time  BP 117/79 10/25/20 1805  Temp    Pulse 86 10/25/20 1807  Resp 22 10/25/20 1807  SpO2 94 % 10/25/20 1807  Vitals shown include unvalidated device data.  Last Pain:  Vitals:   10/25/20 1101  TempSrc:   PainSc: 0-No pain         Complications: No notable events documented.

## 2020-10-25 NOTE — Brief Op Note (Signed)
10/25/2020  5:43 PM  PATIENT:  Barbara Knight  55 y.o. female  PRE-OPERATIVE DIAGNOSIS:  RIGHT UPPER LOBE LUNG NODULE  POST-OPERATIVE DIAGNOSIS:  RIGHT UPPER LOBE LUNG NODULE  PROCEDURE:  Procedure(s):  XI ROBOTIC ASSISTED RIGHT VIDEO THORACOSCOPY -WEDGE RESECTION RIGHT UPPER LOBE -COMPLETION LOBECTOMY RIGHT UPPER LOBE -LYMPH NODE DISSECTION (Right) -INTERCOSTAL NERVE BLOCK (Right)  SURGEON:  Surgeon(s) and Role:    * [name], MD - Primary  PHYSICIAN ASSISTANT: Jadene Pierini PA-C,  Sina Sumpter PA-C  ASSISTANTS: Ruel Favors CST, Shawnie Dapper RN   ANESTHESIA:   general  EBL:  200 mL   BLOOD ADMINISTERED:none  DRAINS:  [address]    LOCAL MEDICATIONS USED:  BUPIVICAINE   SPECIMEN:  Source of Specimen:  Wedge Resection Right Upper Lobe, Right Upper Lobe, Lymph Nodes  DISPOSITION OF SPECIMEN:  PATHOLOGY  COUNTS:  YES  TOURNIQUET:  * No tourniquets in log *  DICTATION: .Dragon Dictation  PLAN OF CARE: Admit to inpatient   PATIENT DISPOSITION:  PACU - hemodynamically stable.   Delay start of Pharmacological VTE agent (>24hrs) due to surgical blood loss or risk of bleeding: no

## 2020-10-25 NOTE — Op Note (Signed)
Video Bronchoscopy with Robotic Assisted Bronchoscopic Navigation   Date of Operation: 10/25/2020   Pre-op Diagnosis: Right upper lobe pulmonary nodule  Post-op Diagnosis: Right upper lobe pulmonary nodule  Surgeon: Garner Nash, DO   Assistants: None   Anesthesia: General endotracheal anesthesia  Operation: Flexible video fiberoptic bronchoscopy with robotic assistance and biopsies.  Estimated Blood Loss: Minimal  Complications: None  Indications and History: Barbara Knight is a 55 y.o. female with history of right upper lobe pulmonary nodule, family history of lung malignancy. The risks, benefits, complications, treatment options and expected outcomes were discussed with the patient.  The possibilities of pneumothorax, pneumonia, reaction to medication, pulmonary aspiration, perforation of a viscus, bleeding, failure to diagnose a condition and creating a complication requiring transfusion or operation were discussed with the patient who freely signed the consent.    Description of Procedure: The patient was seen in the Preoperative Area, was examined and was deemed appropriate to proceed.  The patient was taken to [facility] endoscopy room 3, identified as Barbara Knight and the procedure verified as Flexible Video Fiberoptic Bronchoscopy.  A Time Out was held and the above information confirmed.   Prior to the date of the procedure a high-resolution CT scan of the chest was performed. Utilizing ION software program a virtual tracheobronchial tree was generated to allow the creation of distinct navigation pathways to the patient's parenchymal abnormalities. After being taken to the operating room general anesthesia was initiated and the patient  was orally intubated. The video fiberoptic bronchoscope was introduced via the endotracheal tube and a general inspection was performed which showed normal right and left lung anatomy, aspiration of the bilateral mainstems was completed to remove any  remaining secretions. Robotic catheter inserted into patient's endotracheal tube.   Target #1 right upper lobe: The distinct navigation pathways prepared prior to this procedure were then utilized to navigate to patient's lesion identified on CT scan. The robotic catheter was secured into place and the vision probe was withdrawn.  Lesion location was approximated using fluoroscopy and radial endobronchial ultrasound for peripheral targeting. Under fluoroscopic guidance transbronchial needle brushings, transbronchial needle biopsies, and transbronchial forceps biopsies were performed to be sent for cytology and pathology.  Following tissue sampling the transbronchial needle was loaded with 50% / 50% mixture of methylene blue and ICG.  Under fluoroscopic guidance the needle was deployed into the proximity of the lesion and 1 cc of solution was injected for fiducial dye marking.  At the end of the procedure a general airway inspection was performed and there was no evidence of active bleeding. The bronchoscope was removed.  The patient tolerated the procedure well. There was no significant blood loss and there were no obvious complications. A post-procedural chest x-ray is pending.  Samples Target #1: 1. Transbronchial needle brushings from RUL 2. Transbronchial forceps biopsies from RUL  Plans:  Patient will be transported to the operating room under the care of anesthesia and Dr. Roxan Hockey for planned wedge resection and possible lobectomy.  Garner Nash, DO [facility] 10/25/2020 2:10 PM

## 2020-10-26 ENCOUNTER — Inpatient Hospital Stay (HOSPITAL_COMMUNITY): Payer: PRIVATE HEALTH INSURANCE

## 2020-10-26 ENCOUNTER — Encounter (HOSPITAL_COMMUNITY): Payer: Self-pay | Admitting: Thoracic Surgery (Cardiothoracic Vascular Surgery)

## 2020-10-26 LAB — BASIC METABOLIC PANEL
Anion gap: 7 (ref 5–15)
BUN: 13 mg/dL (ref 6–20)
CO2: 23 mmol/L (ref 22–32)
Calcium: 8.3 mg/dL — ABNORMAL LOW (ref 8.9–10.3)
Chloride: 104 mmol/L (ref 98–111)
Creatinine, Ser: 0.8 mg/dL (ref 0.44–1.00)
GFR, Estimated: >60 mL/min (ref >60)
Glucose, Bld: 175 mg/dL — ABNORMAL HIGH (ref 70–99)
Potassium: 4.2 mmol/L (ref 3.5–5.1)
Sodium: 134 mmol/L — ABNORMAL LOW (ref 135–145)

## 2020-10-26 LAB — CBC
HCT: 34.7 % — ABNORMAL LOW (ref 36.0–46.0)
Hemoglobin: 11.2 g/dL — ABNORMAL LOW (ref 12.0–15.0)
MCH: 29.3 pg (ref 26.0–34.0)
MCHC: 32.3 g/dL (ref 30.0–36.0)
MCV: 90.8 fL (ref 80.0–100.0)
Platelets: 100 10*3/uL — ABNORMAL LOW (ref 150–400)
RBC: 3.82 MIL/uL — ABNORMAL LOW (ref 3.87–5.11)
RDW: 13.7 % (ref 11.5–15.5)
WBC: 7.3 10*3/uL (ref 4.0–10.5)
nRBC: 0 % (ref 0.0–0.2)

## 2020-10-26 LAB — CYTOLOGY - NON PAP

## 2020-10-26 MED ORDER — KETOROLAC TROMETHAMINE 30 MG/ML IJ SOLN
30.0000 mg | Freq: Four times a day (QID) | INTRAMUSCULAR | Status: DC | PRN
  Administered 2020-10-26 – 2020-10-29 (×12): 30 mg via INTRAVENOUS
  Filled 2020-10-26 (×12): qty 1

## 2020-10-26 NOTE — Discharge Summary (Addendum)
Physician Discharge Summary  Patient ID: Barbara Knight MRN: [id number] DOB/AGE: [birth year] 55 y.o.  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  She has a family history of aortic aneurysm and lung cancer.  Her mother died of an EGFR positive adenocarcinoma of the lung.   She was diagnosed with autoimmune hepatitis after abnormal LFTs were noted on routine labs.  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  She currently is being treated with Myfortic.  She had been on cyclosporine previously but that was stopped due to concerns for renal function.  She had COVID in January.  She had residual shortness of breath for quite a while after that.  She had CT to rule out coronary disease in March 2022.  Her coronary calcium score was 0.  It did show a right upper lobe groundglass opacity.  She was referred to Dr. Chase Caller.  A repeat scan in June showed no change.  There was a 6 mm right lower lobe groundglass opacity noted as well.  She  recently had another follow-up CT which showed an increase in size of the nodule from 12 x 10 to 14 x 9 mm.  There is a small solid component measuring about 3 mm.  The lower lobe opacity was unchanged.   She then was referred to Dr. Valeta Harms and Dr. Roxan Hockey.  They agreed patient would favor bronchoscopy with biopsy and marking and then proceeding with Robotic Assisted surgical resection.  The risks and benefits of the procedure were explained to the patient and she was agreeable to proceed.  Hospital Course:  Barbara Knight presented to [facility] on 10/25/2020.  She was taken to the OR and underwent Video bronchoscopy with robotic assistance and biopsies.  She then underwent Robotic Assisted Right Video Assisted Thoracoscopy with wedge resection of right upper lobe, completion lobectomy right upper lobe, lymph node dissection, and intercostal nerve block.  She tolerated the procedure without difficulty, was extubated and taken to the SICU in stable condition.  She did well overnight.   Her IV fluids and foley catheter were discontinued without difficulty.  Her chest tube exhibited an air leak on POD #1.  CXR showed a track of sub cutaneous emphysema with some collapse of her remaining right lung.   Her chest tube was placed back on suction.  Follow up CXR showed right lung to be re expanded, no pneumothorax. Chest tube was then placed to water seal. CXR that afternoon showed small right apical pneumothorax. CXR the am of 10/06 however  showed an increase in right pneumothorax and chest tube had an air leak with cough. Chest tube was placed back to suction. Patient had complaints of constipation and reflux and was given Miralax and Protonix.  Her air leak improved and her chest tube was placed back to water seal on 10/30/2020.  CXR showed a tiny, <5% right apical PTX. The chest tube was placed to water seal and follow up CXR was obtained about 4 hour later showing a slight ncrease in the right PTX. The  decision was made to leave the chest tube in place connected to an [place] and discharge Barbara Knight to home.  Dr. Roxan Hockey will see her in the office on Monday, 11/01/20.  Consults:  Pulmonology, Dr. Valeta Harms  Significant Diagnostic Studies: Super D Chest CT  1. Slight interval enlargement of the right upper lobe sub solid nodular opacity. 2. No mediastinal or hilar mass or adenopathy. 3. Stable cirrhotic changes involving the liver with portal venous hypertension and portal venous collaterals. 4. Aortic atherosclerosis.  Treatments: surgery:   PHYSICIAN: Revonda Standard. Roxan Hockey, MD   Operative Report    DATE OF PROCEDURE: 10/25/2020   PREOPERATIVE DIAGNOSIS:  Right upper lobe lung nodule.   POSTOPERATIVE DIAGNOSIS:  Adenocarcinoma, right upper lobe, clinical stage IA (T1, N0).   PROCEDURE:  Robotic right VATS wedge resection right upper lobe nodule, right upper lobectomy, lymph node dissection and intercostal nerve blocks levels 3 through 10.   SURGEON:  Revonda Standard. Roxan Hockey, MD   ASSISTANT:  Jadene Pierini PA-C and Ellwood Handler PA-C  PATHOLOGY:  SURGICAL PATHOLOGY  CASE: [id number]  PATIENT: Barbara Knight  Surgical Pathology Report   Clinical History: right upper lobe lung nodule (cm)    FINAL MICROSCOPIC DIAGNOSIS:   A. LUNG, RIGHT UPPER LOBE, WEDGE RESECTION:  - Adenocarcinoma, 1.2 cm.  - Margins not involved.  - See oncology table.   B. LYMPH NODE, LEVEL 10 #2, EXCISION:  - Lymph node with non-necrotizing granulomas.  - Negative for metastatic carcinoma.   C. LUNG, RIGHT UPPER LOBE, LOBECTOMY:  - Lung lobe with features consistent with previous wedge biopsy.  - No residual carcinoma identified.  - All final surgical margins negative for carcinoma.   D. LYMPH NODE, LEVEL 7, EXCISION:  - Lymph node with non-necrotizing granulomas.  - Negative for metastatic carcinoma.   E. LYMPH NODE, LEVEL 7 #2 , EXCISION:  - Lymph node with non-necrotizing  granulomas.  - Negative for metastatic carcinoma.   F. LYMPH NODE, LEVEL 11, EXCISION:  - Lymph node with non-necrotizing granulomas.  - Negative for metastatic carcinoma.   G. LYMPH NODE, LEVEL 12, EXCISION:  - Lymph node with non-necrotizing granulomas.  - Negative for metastatic carcinoma.   H. LYMPH NODE, LEVEL 10, EXCISION:  - Lymph node with non-necrotizing granulomas.  - Negative for metastatic carcinoma.   I. LYMPH NODE, LEVEL 4R, EXCISION:  - Lymph node with non-necrotizing granulomas.  - Negative for metastatic carcinoma.   J. LYMPH NODE, LEVEL 4R #2, EXCISION:  - Lymph node with non-necrotizing granulomas.  - Negative for metastatic carcinoma.   K. LYMPH NODE, LEVEL 4R #3, EXCISION:  - Benign adipose tissue.  - No lymph node tissue identified.   L. LYMPH NODE, LEVEL 12 #2, EXCISION:  - Lymph node with non-necrotizing granulomas.  - Negative for metastatic carcinoma.   M. LYMPH NODE, LEVEL 12 #3, EXCISION:  - Lymph node with non-necrotizing granulomas.  - Negative for metastatic carcinoma.   Discharge Exam: Blood  pressure 113/76, pulse 62, temperature 98.1 F (36.7 C), temperature source Oral, resp. rate 20, height _0  (1.702 m), weight 72.6 kg, last menstrual period 11/11/2014, SpO2 97 %.  General appearance: alert, cooperative, and no distress Heart: regular rate and rhythm Lungs: clear to auscultation bilaterally Abdomen: benign Extremities: no edema or calf tenderness Wound: incis healing well  Disposition: Discharged to home in stable condition.    Allergies as of 10/30/2020   No Known Allergies      Medication List     TAKE these medications    Intrarosa 6.5 MG Inst Generic drug: Prasterone Place 1 tablet vaginally daily.   methocarbamol 500 MG tablet Commonly known as: ROBAXIN Take 1 tablet (500 mg total) by mouth every 8 (eight) hours as needed for up to 5 days for muscle spasms.   Multi-Vitamin/Iron Tabs Take 1 tablet by mouth  daily.   mycophenolate 360 MG Tbec EC tablet Commonly known as: MYFORTIC Take 720 mg by mouth 2 (two) times daily.   progesterone 100 MG capsule Commonly known as: PROMETRIUM Take 100 mg by mouth daily.   traMADol 50 MG tablet Commonly known as: ULTRAM Take 1-2 tablets (50-100 mg total) by mouth every 6 (six) hours as needed for up to 5 days (mild pain).   ursodiol 500 MG tablet Commonly known as: ACTIGALL Take 500 mg by mouth 2 (two) times daily.        Follow-up Information     [name], MD Follow up on 11/01/2020.   Specialty: Cardiothoracic Surgery Why: The office will contact you with appointment instructions for Monday, 11/01/20. Contact information: [address] [place] [address] [phone]                 Signed: Antony Odea, PA-C  10/30/2020, 12:44 PM

## 2020-10-26 NOTE — Progress Notes (Addendum)
      [address]       [place] [address]             [phone]      1 Day Post-Op Procedure(s) (LRB): XI ROBOTIC ASSISTED RIGHT UPPER LOBE THORASCOPY-RIGHT UPPER LOBE WEDGE RESECTION AND RIGHT UPPER LOBECTOMY (Right) LYMPH NODE DISSECTION (Right) INTERCOSTAL NERVE BLOCK (Right)  Subjective:  Up in chair, trying to get comfortable.  She has been having some pain which she is trying to get a good regimen use to control pain.  Denies N/V.  Objective: Vital signs in last 24 hours: Temp:  [97.8 F (36.6 C)-98.4 F (36.9 C)] 98.3 F (36.8 C) (10/04 0446) Pulse Rate:  [61-86] 61 (10/04 0446) Cardiac Rhythm: Normal sinus rhythm (10/03 2026) Resp:  [13-23] 23 (10/04 0446) BP: (102-127)/(72-94) 102/72 (10/04 0300) SpO2:  [94 %-98 %] 95 % (10/04 0446) Weight:  [72.6 kg] 72.6 kg (10/03 1047)  Intake/Output from previous day: 10/03 0701 - 10/04 0700 In: 3624.2 [P.O.:480; I.V.:2394.2; IV Piggyback:550] Out: 780 [Urine:350; Blood:200; Chest Tube:230]  General appearance: alert, cooperative, and no distress Heart: regular rate and rhythm Lungs: clear to auscultation bilaterally Abdomen: soft, non-tender; bowel sounds normal; no masses,  no organomegaly Extremities: extremities normal, atraumatic, no cyanosis or edema Wound: clean and dry, ecchymosis at port sites  Lab Results: Recent Labs    10/26/20 0042  WBC 7.3  HGB 11.2*  HCT 34.7*  PLT 100*   BMET:  Recent Labs    10/26/20 0042  NA 134*  K 4.2  CL 104  CO2 23  GLUCOSE 175*  BUN 13  CREATININE 0.80  CALCIUM 8.3*    PT/INR: No results for input(s): LABPROT, INR in the last 72 hours. ABG    Component Value Date/Time   PHART 7.435 10/21/2020 1353   HCO3 21.5 10/21/2020 1353   ACIDBASEDEF 2.1 (H) 10/21/2020 1353   O2SAT 97.3 10/21/2020 1353   CBG (last 3)  No results for input(s): GLUCAP in the last 72 hours.  Assessment/Plan: S/P Procedure(s) (LRB): XI ROBOTIC ASSISTED RIGHT UPPER  LOBE THORASCOPY-RIGHT UPPER LOBE WEDGE RESECTION AND RIGHT UPPER LOBECTOMY (Right) LYMPH NODE DISSECTION (Right) INTERCOSTAL NERVE BLOCK (Right)  CV- hemodynamically stable in NSR, good BP control Pulm- large air leak with cough, out of proportion from what I wouldve expected from her procedure.  CXR shows no evidence of pneumothorax, there is some sub cutaneous emphysema.   I suspect she is leaking air from around chest tube site, I have redressed chest tube with some improvement in air leak Renal- creatinine, lytes okay, d/c IV fluids likely source of elevated blood sugar Lovenox for DVT prophylaxis Pain control- continue  Toradol, Oxy, Tramadol prn Dispo- patient stable, large air leak with cough, CT site redressed, Dr. Roxan Hockey will evaluate, CT will stay in place today, IV fluids and foley are discontinued, repeat CXR in AM   LOS: 1 day    Erin Barrett, PA-C 10/26/2020  Patient seen and examined, agree with above She does have an air leak- will place tube to suction this AM, water seal to ambulate Pain control a work in progress Overall doing extremely well  Remo Lipps C. Roxan Hockey, MD [facility] [phone]

## 2020-10-26 NOTE — Op Note (Signed)
NAME: Barbara Knight, Barbara Knight RECORD NO: [id number] ACCOUNT NO: [id number] DATE OF BIRTH: 19-Aug-1965 FACILITY: [facility] LOCATION: [facility] PHYSICIAN: Revonda Standard. Roxan Hockey, MD  Operative Report   DATE OF PROCEDURE: 10/25/2020  PREOPERATIVE DIAGNOSIS:  Right upper lobe lung nodule.  POSTOPERATIVE DIAGNOSIS:  Adenocarcinoma, right upper lobe, clinical stage IA (T1, N0).  PROCEDURE:   Robotic right VATS  Wedge resection right upper lobe nodule, Right upper lobectomy, Lymph node dissection and Intercostal nerve blocks levels 3 through 10.  SURGEON:  Revonda Standard. Roxan Hockey, MD  ASSISTANT:  Jadene Pierini, PA and Ellwood Handler, [place].  ANESTHESIA:  General.  FINDINGS:  Frozen section showed adenocarcinoma.  Level 10 node negative for tumor.  Bronchial margin clear.  CLINICAL NOTE:  Barbara Knight is a 55 year old woman with no history of tobacco abuse.  She had a CT for evaluation of shortness of breath that showed a right upper lobe lung nodule. On followup CT there was a question that the nodule may have increased in  size.  This was a mixed density solid and sub-solid nodule.  After discussion, she opted to proceed with surgical resection.  The plan was to do a robotic bronchoscopy followed by robotic wedge resection, segmentectomy, or lobectomy depending on  intraoperative findings.  The indications, risks, benefits, and alternatives were discussed in detail with the patient.  She understood and accepted the risks and agreed to proceed.  OPERATIVE NOTE:  Barbara Knight was brought to the preoperative holding area on 10/25/2020.  Anesthesia established intravenous access and placed an arterial blood pressure monitoring line.  She was anesthetized and intubated in the endoscopy suite.  Dr. Valeta Harms performed flexible fiberoptic bronchoscopy and robotic bronchoscopy.  Brushings from the lesion showed reactive cells, but no definite tumor or granulomas.  The nodule was marked with 1 mL of a solution containing 50%  methylene blue and 50% ICG.  She  then was transported to the operating room while still under anesthesia.  She was reintubated with a double lumen endotracheal tube.  Sequential compression devices were in place for DVT prophylaxis.  Foley catheter was placed.  She was placed in a left  lateral decubitus position.  The right chest was prepped and draped in the usual sterile fashion.  Intravenous antibiotics were administered.  Single lung ventilation of the left lung was initiated and was tolerated well throughout the procedure.  A timeout was performed.  A solution containing 20 mL of liposomal bupivacaine, 30 mL of 0.5% bupivacaine and 50 mL of saline was prepared.  This was used for local at the incision sites as well as for the intercostal nerve blocks.  An incision was made  in the eighth interspace in the midaxillary line.  An 8 mm port was inserted.  The thoracoscope was advanced into the chest.  After confirming intrapleural placement.  Carbon dioxide was insufflated per protocol.  A 12 mm port was placed anterior to the  camera port in the eighth interspace.  Intercostal nerve blocks then were performed from the 3rd to the 10th interspace.  10 mL of the bupivacaine solution was injected into a subpleural plane at each level from the 3rd to the 10th interspace.  Two  additional eighth interspace ports were placed, one 8 mm and the other 12 mm.  A 12 mm AirSeal port was placed in the tenth interspace posterolaterally.  The robot was deployed.  The camera arm was docked.  Targeting was performed.  The remaining arms  were docked.  The robotic  instruments were inserted with thoracoscopic visualization.  The apex was inspected with the [facility] setting on the robotic console.  The area of ICG was easily identifiable at the apex.  A wedge resection was performed with sequential firings of the robotic stapler using both a blue and green cartridges.  The specimen was  placed into an endoscopic retrieval  bag, removed, and sent for frozen section.  The inferior ligament was divided.  No lymph nodes were identified.  The pleural reflection was divided to the hilum posteriorly.  Level 7 and 11 nodes were dissected out  working superiorly.  There were some adhesions around the azygos to the hilum.  These were taken down carefully.  The pulmonary arteries were exposed as was the right upper lobe bronchus.  The pleura overlying the mediastinum superior to the azygos vein  was incised and the level 4 nodes were removed.  At this point, the frozen section returned showing adenocarcinoma.  Attention then was turned to the fissure.  There were some visible nodes in the fissure and the pleura was incised over these nodes.   After removing the nodes, the basilar segmental trunk to the lower lobe was identified and dissection was carried out along the pulmonary artery.  The superior segmental trunk was identified.  The fissure was completed posteriorly with sequential firings  of the robotic stapler using blue and green cartridges. A relatively small posterior ascending branch that bifurcated just after its origin was identified.  This was encircled and divided with the vascular stapler.  There was a relatively large level 10  node was exposed after dividing the posterior ascending branch.  This extended along the right upper lobe bronchus down to the right mainstem.  Partial dissection of this node was carried out, it was clear that the remainder of the node would not be able  to be removed until the dissection was much further long.  At this point, it was felt that a lobectomy would be more appropriate than a segmentectomy.  The minor fissure was completed, working both from an anterior and posterior approach.  This required  multiple firings of the robotic stapler again using both blue and green cartridges.  The superior pulmonary vein had been dissected out and the middle lobe branch had been identified, the upper  lobe branches were encircled and divided with the vascular  stapler.  More dissection was done on the level 10 node and this exposed the anterior-apical trunk, which then was divided with the robotic stapler.  While dissecting out the node, there was a small, approximately 2 mm branch coming off the pulmonary  artery.  This was divided with a SynchroSeal device.  After completing the minor fissure, the level 10 node was dissected off the bronchus and sent for frozen section.  It subsequently returned with no tumor seen.  The stapler was placed across the right  upper lobe bronchus at its origin.  The stapler was closed.  A test inflation showed good aeration of the lower and middle lobes.  The stapler was fired transecting the right upper lobe bronchus.  The right upper lobe was placed into an endoscopic  retrieval bag and brought down to the inferior aspect of the chest.  Chest was copiously irrigated with saline.  The middle lobe was tacked to the lower lobe using the robotic stapler to prevent torsion.  Subsequent test inflation revealed no leakage  from the bronchial stump or the staple lines.  There was an area in  the very anterior most portion of the middle lobe where a small part of the upper lobe had been left on the middle lobe where the fissure was incomplete.  This nonfunctional piece was removed with the  robotic stapler.  The robotic instruments were removed.  The robot was undocked.  The anterior eighth interspace incision was lengthened to 3 cm.  Additional Exparel was injected at that site.  The lobe then was removed and sent for frozen section of the  bronchial margin, which returned negative for tumor.  After ensuring that all the sponges and the vessel loop that were placed during the procedure had been removed, a 28-French Blake drain was placed through the original port incision and secured with #1 silk  suture.  Dual lung ventilation was resumed.  The incisions were closed in standard  fashion.  Dermabond was applied to the skin and the chest tube was placed to a Pleur-Evac on waterseal.  The patient was extubated in the operating room and taken to the  postanesthetic care unit in good condition.   PUS D: 10/25/2020 6:22:36 pm T: 10/26/2020 2:28:00 am  JOB: [id number]/ [id number]

## 2020-10-26 NOTE — Anesthesia Postprocedure Evaluation (Signed)
Anesthesia Post Note  Patient: Barbara Knight  Procedure(s) Performed: XI ROBOTIC ASSISTED RIGHT UPPER LOBE THORASCOPY-RIGHT UPPER LOBE WEDGE RESECTION AND RIGHT UPPER LOBECTOMY (Right: Chest) LYMPH NODE DISSECTION (Right: Chest) INTERCOSTAL NERVE BLOCK (Right: Chest)     Patient location during evaluation: PACU Anesthesia Type: General Level of consciousness: awake and alert Pain management: pain level controlled Vital Signs Assessment: post-procedure vital signs reviewed and stable Respiratory status: spontaneous breathing, nonlabored ventilation, respiratory function stable and patient connected to nasal cannula oxygen Cardiovascular status: blood pressure returned to baseline and stable Postop Assessment: no apparent nausea or vomiting Anesthetic complications: no   No notable events documented.  Last Vitals:  Vitals:   10/26/20 0803 10/26/20 1115  BP: 102/73 98/73  Pulse: (!) 58 (!) 57  Resp: (!) 22 (!) 22  Temp: 36.5 C (!) 36.4 C  SpO2: 97% 95%    Last Pain:  Vitals:   10/26/20 1355  TempSrc:   PainSc: 5                  Tiajuana Amass

## 2020-10-26 NOTE — Progress Notes (Signed)
Wasted 10mg  bisacodyl tablets. Med was dropped in floor. Additional 10mg  bisacodyl removed from pyxis and given to pt.

## 2020-10-26 NOTE — Hospital Course (Addendum)
History of Present Illness:  Barbara Knight is a 55 year old woman with a past medical history significant for autoimmune hepatitis, mitral valve prolapse, dilated ascending aorta, bradycardia, and a thyroid cyst.  She has a family history of aortic aneurysm and lung cancer.  Her mother died of an EGFR positive adenocarcinoma of the lung.   She was diagnosed with autoimmune hepatitis after abnormal LFTs were noted on routine labs.  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  She currently is being treated with Myfortic.  She had been on cyclosporine previously but that was stopped due to concerns for renal function.  She had COVID in January.  She had residual shortness of breath for quite a while after that.  She had CT to rule out coronary disease in March 2022.  Her coronary calcium score was 0.  It did show a right upper lobe groundglass opacity.  She was referred to Dr. Chase Caller.  A repeat scan in June showed no change.  There was a 6 mm right lower lobe groundglass opacity noted as well.  She recently had another follow-up CT which showed an increase in size of the nodule from 12 x 10 to 14 x 9 mm.  There is a small solid component measuring about 3 mm.  The lower lobe opacity was unchanged.   She then was referred to Dr. Valeta Harms and Dr. Roxan Hockey.  They agreed patient would favor bronchoscopy with biopsy and marking and then proceeding with Robotic Assisted surgical resection.  The risks and benefits of the procedure were explained to the patient and she was agreeable to proceed.  Hospital Course:  Barbara Knight presented to [facility] on 10/25/2020.  She was taken to the OR and underwent Video bronchoscopy with robotic assistance and biopsies.  She then underwent Robotic Assisted Right Video Assisted Thoracoscopy with wedge resection of right upper lobe, completion lobectomy right upper lobe, lymph node dissection, and intercostal nerve block.  She tolerated the procedure  without difficulty, was extubated and taken to the SICU in stable condition.  She did well overnight.   Her IV fluids and foley catheter were discontinued without difficulty.  Her chest tube exhibited an air leak on POD #1.  CXR showed a track of sub cutaneous emphysema with some collapse of her remaining right lung.   Her chest tube was placed back on suction.  Follow up CXR showed right lung to be re expanded, no pneumothorax. Chest tube was then placed to water seal. CXR that afternoon showed small right apical pneumothorax. CXR the am of 10/06 however showed an increase in right pneumothorax and chest tube had an air leak with cough. Chest tube was placed back to suction. Patient had complaints of constipation and reflux and was given Miralax and Protonix.  Her air leak improved and her chest tube was placed back to water seal on 10/***/2022.  CXR showed ***.

## 2020-10-27 ENCOUNTER — Inpatient Hospital Stay (HOSPITAL_COMMUNITY): Payer: PRIVATE HEALTH INSURANCE

## 2020-10-27 ENCOUNTER — Encounter (HOSPITAL_COMMUNITY): Payer: Self-pay | Admitting: Pulmonary Disease

## 2020-10-27 LAB — COMPREHENSIVE METABOLIC PANEL
ALT: 24 U/L (ref 0–44)
AST: 32 U/L (ref 15–41)
Albumin: 3.1 g/dL — ABNORMAL LOW (ref 3.5–5.0)
Alkaline Phosphatase: 86 U/L (ref 38–126)
Anion gap: 5 (ref 5–15)
BUN: 17 mg/dL (ref 6–20)
CO2: 25 mmol/L (ref 22–32)
Calcium: 8 mg/dL — ABNORMAL LOW (ref 8.9–10.3)
Chloride: 103 mmol/L (ref 98–111)
Creatinine, Ser: 0.87 mg/dL (ref 0.44–1.00)
GFR, Estimated: >60 mL/min (ref >60)
Glucose, Bld: 118 mg/dL — ABNORMAL HIGH (ref 70–99)
Potassium: 3.6 mmol/L (ref 3.5–5.1)
Sodium: 133 mmol/L — ABNORMAL LOW (ref 135–145)
Total Bilirubin: 0.9 mg/dL (ref 0.3–1.2)
Total Protein: 5.5 g/dL — ABNORMAL LOW (ref 6.5–8.1)

## 2020-10-27 LAB — CBC
HCT: 33.9 % — ABNORMAL LOW (ref 36.0–46.0)
Hemoglobin: 11.2 g/dL — ABNORMAL LOW (ref 12.0–15.0)
MCH: 29.7 pg (ref 26.0–34.0)
MCHC: 33 g/dL (ref 30.0–36.0)
MCV: 89.9 fL (ref 80.0–100.0)
Platelets: 104 10*3/uL — ABNORMAL LOW (ref 150–400)
RBC: 3.77 MIL/uL — ABNORMAL LOW (ref 3.87–5.11)
RDW: 13.6 % (ref 11.5–15.5)
WBC: 5.6 10*3/uL (ref 4.0–10.5)
nRBC: 0 % (ref 0.0–0.2)

## 2020-10-27 LAB — SURGICAL PATHOLOGY

## 2020-10-27 MED ORDER — POLYETHYLENE GLYCOL 3350 17 G PO PACK
17.0000 g | PACK | Freq: Once | ORAL | Status: AC
  Administered 2020-10-27: 17 g via ORAL
  Filled 2020-10-27: qty 1

## 2020-10-27 NOTE — Plan of Care (Signed)

## 2020-10-27 NOTE — Progress Notes (Signed)
Pleuravac exchanged per verbal order from Dr. Roxan Hockey.

## 2020-10-27 NOTE — Progress Notes (Addendum)
[address]       [place] [address]             [phone]      2 Days Post-Op Procedure(s) (LRB): XI ROBOTIC ASSISTED RIGHT UPPER LOBE THORASCOPY-RIGHT UPPER LOBE WEDGE RESECTION AND RIGHT UPPER LOBECTOMY (Right) LYMPH NODE DISSECTION (Right) INTERCOSTAL NERVE BLOCK (Right) Subjective:  Pain control is reasonable, no sputum, no BM/flatus  Objective: Vital signs in last 24 hours: Temp:  [97.5 F (36.4 C)-98.6 F (37 C)] 98 F (36.7 C) (10/05 0724) Pulse Rate:  [51-65] 57 (10/05 0724) Cardiac Rhythm: Sinus bradycardia (10/05 0734) Resp:  [14-22] 20 (10/05 0724) BP: (98-119)/(73-83) 112/79 (10/05 0724) SpO2:  [92 %-97 %] 96 % (10/05 0724)  Hemodynamic parameters for last 24 hours:    Intake/Output from previous day: 10/04 0701 - 10/05 0700 In: 480 [P.O.:480] Out: 200 [Chest Tube:200] Intake/Output this shift: No intake/output data recorded.  General appearance: alert, cooperative, and no distress Heart: regular rate and rhythm Lungs: mildly dim on right Abdomen: benign, faint BS Extremities: no edema Wound: incis healing well  Lab Results: Recent Labs    10/26/20 0042 10/27/20 0150  WBC 7.3 5.6  HGB 11.2* 11.2*  HCT 34.7* 33.9*  PLT 100* 104*   BMET:  Recent Labs    10/26/20 0042 10/27/20 0150  NA 134* 133*  K 4.2 3.6  CL 104 103  CO2 23 25  GLUCOSE 175* 118*  BUN 13 17  CREATININE 0.80 0.87  CALCIUM 8.3* 8.0*    PT/INR: No results for input(s): LABPROT, INR in the last 72 hours. ABG    Component Value Date/Time   PHART 7.435 10/21/2020 1353   HCO3 21.5 10/21/2020 1353   ACIDBASEDEF 2.1 (H) 10/21/2020 1353   O2SAT 97.3 10/21/2020 1353   CBG (last 3)  No results for input(s): GLUCAP in the last 72 hours.  Meds Scheduled Meds:  acetaminophen  1,000 mg Oral Q6H   Or   acetaminophen (TYLENOL) oral liquid 160 mg/5 mL  1,000 mg Oral Q6H   bisacodyl  10 mg Oral Q2200   enoxaparin (LOVENOX) injection  40 mg  Subcutaneous Q24H   multivitamins with iron  1 tablet Oral Daily   mycophenolate  720 mg Oral BID   progesterone  100 mg Oral Daily   senna-docusate  1 tablet Oral QHS   ursodiol  500 mg Oral BID   Continuous Infusions: PRN Meds:.albuterol, fentaNYL (SUBLIMAZE) injection, ketorolac, ondansetron (ZOFRAN) IV, oxyCODONE, traMADol  Xrays DG Chest Port 1 View  Result Date: 10/26/2020 CLINICAL DATA:  Status post right lobectomy. EXAM: PORTABLE CHEST 1 VIEW COMPARISON:  Same day. FINDINGS: The heart size and mediastinal contours are within normal limits. Left lung is clear. Right-sided chest tube is again noted. Increased subcutaneous emphysema is seen over right lateral chest wall and right supraclavicular region. Moderate right apical pneumothorax is noted which is increased compared to prior exam. The visualized skeletal structures are unremarkable. IMPRESSION: Moderate right apical pneumothorax is noted which is increased compared to prior exam. Right-sided chest tube is again noted. Cutaneous emphysema is noted. Electronically Signed   By: Marijo Conception M.D.   On: 10/26/2020 09:22   DG Chest Port 1 View  Result Date: 10/25/2020 CLINICAL DATA:  Status post right upper lobectomy EXAM: PORTABLE CHEST 1 VIEW COMPARISON:  Film from 10/21/2020 FINDINGS: Cardiac shadow is within normal limits. Left lung is well aerated and clear. New right-sided chest tube is  noted with the tip in the apex. Changes of prior right upper lobectomy are noted. Mild pneumothorax is noted in the apex related to incomplete re-expansion of the right lung. Follow-up films are recommended. No bony abnormality is seen. IMPRESSION: Small apical pneumothorax on the right likely related to incomplete re-expansion of the right lung following right upper lobectomy. Chest tube is noted in satisfactory position. Electronically Signed   By: Inez Catalina M.D.   On: 10/25/2020 20:07   DG C-ARM BRONCHOSCOPY  Result Date: 10/25/2020 C-ARM  BRONCHOSCOPY: Fluoroscopy was utilized by the requesting physician.  No radiographic interpretation.    Assessment/Plan: S/P Procedure(s) (LRB): XI ROBOTIC ASSISTED RIGHT UPPER LOBE THORASCOPY-RIGHT UPPER LOBE WEDGE RESECTION AND RIGHT UPPER LOBECTOMY (Right) LYMPH NODE DISSECTION (Right) INTERCOSTAL NERVE BLOCK (Right)  1 afeb, VSS 2 sats good on RA 3  CT drainage 200/24 h- keep to suction till seen by MD- no current air leak 4 CXR no significant space/pntx, some increase in sub q air but reasonably stable,  some RML consolidation/atx 5 expected ABLA stable 6 no leukocytosis 7 thrombocytopenia trend improving >100k 8 minor hyponatremia 9 normal renal fxn, UOP totals not recorded, she is voiding 10 add flutter valve, push rehab/IS as able    LOS: 2 days    [name] PA-C Pager [phone] 10/27/2020   Patient seen and examined, agree with above No air leak on suction. After placing to water seal can hear air in tubing but no leak in pleuravac Will leave on water seal and repeat CXR in a couple of hours  Remo Lipps C. Roxan Hockey, MD [facility] [phone]

## 2020-10-28 ENCOUNTER — Encounter: Payer: Self-pay | Admitting: *Deleted

## 2020-10-28 ENCOUNTER — Encounter: Payer: Self-pay | Admitting: Genetic Counselor

## 2020-10-28 ENCOUNTER — Other Ambulatory Visit: Payer: Self-pay | Admitting: *Deleted

## 2020-10-28 ENCOUNTER — Inpatient Hospital Stay (HOSPITAL_COMMUNITY): Payer: PRIVATE HEALTH INSURANCE

## 2020-10-28 MED ORDER — INFLUENZA VAC SPLIT QUAD 0.5 ML IM SUSY: 0.5000 mL | PREFILLED_SYRINGE | INTRAMUSCULAR | Status: DC

## 2020-10-28 MED ORDER — PANTOPRAZOLE SODIUM 40 MG PO TBEC
40.0000 mg | DELAYED_RELEASE_TABLET | Freq: Every day | ORAL | Status: DC
  Administered 2020-10-28 – 2020-10-30 (×3): 40 mg via ORAL
  Filled 2020-10-28 (×3): qty 1

## 2020-10-28 MED ORDER — POLYETHYLENE GLYCOL 3350 17 G PO PACK
17.0000 g | PACK | Freq: Every day | ORAL | Status: DC | PRN
  Administered 2020-10-28 – 2020-10-29 (×2): 17 g via ORAL
  Filled 2020-10-28 (×2): qty 1

## 2020-10-28 NOTE — Progress Notes (Addendum)
      [address]       [place] [address]             [phone]       3 Days Post-Op Procedure(s) (LRB): XI ROBOTIC ASSISTED RIGHT UPPER LOBE THORASCOPY-RIGHT UPPER LOBE WEDGE RESECTION AND RIGHT UPPER LOBECTOMY (Right) LYMPH NODE DISSECTION (Right) INTERCOSTAL NERVE BLOCK (Right)  Subjective: Patient with heartburn and constipation  Objective: Vital signs in last 24 hours: Temp:  [97.8 F (36.6 C)-98.3 F (36.8 C)] 98.1 F (36.7 C) (10/06 0409) Pulse Rate:  [55-60] 57 (10/06 0409) Cardiac Rhythm: Sinus bradycardia (10/06 0701) Resp:  [15-19] 17 (10/06 0409) BP: (106-122)/(72-87) 116/78 (10/06 0409) SpO2:  [95 %-98 %] 96 % (10/06 0409)     Intake/Output from previous day: 10/05 0701 - 10/06 0700 In: 714 [P.O.:714] Out: 124 [Chest Tube:124]   Physical Exam:  Cardiovascular: RRR Pulmonary: Clear to auscultation on the left and diminished right apex Abdomen: Soft, non tender, bowel sounds present. Extremities: No lower extremity edema. Wounds: Clean and dry.  No erythema or signs of infection. Chest Tube: to water seal, +++ air leak with cough  Lab Results: CBC: Recent Labs    10/26/20 0042 10/27/20 0150  WBC 7.3 5.6  HGB 11.2* 11.2*  HCT 34.7* 33.9*  PLT 100* 104*   BMET:  Recent Labs    10/26/20 0042 10/27/20 0150  NA 134* 133*  K 4.2 3.6  CL 104 103  CO2 23 25  GLUCOSE 175* 118*  BUN 13 17  CREATININE 0.80 0.87  CALCIUM 8.3* 8.0*    PT/INR: No results for input(s): LABPROT, INR in the last 72 hours. ABG:  INR: Will add last result for INR, ABG once components are confirmed Will add last 4 CBG results once components are confirmed  Assessment/Plan:  1. CV - SR, SB at times. 2.  Pulmonary - On room air. Chest tube with 124 cc last 24 hours. Chest tube is to water seal and there is +++ air leak with cough. CXR this am shows increased right pneumothorax. Will place chest tube back to suciion. Encourage incentive  spirometer. Final pathology results:pT1b, pN0 . Dr. Roxan Hockey already discussed with patient. Check CXR in am 3. Expected post op blood loss anemia-H and H yesterday stable at 11.2 and 33.9 4. Thrombocytopenia-platelets yesterday up to 104,000 5. Protonix daily, Miralax daily PRN  Nani Skillern PA-C 10/28/2020,7:24 AM [phone]   Patient seen and examined, the lung did drop a little on water seal. Now back on suction with only a very small air leak Will leave on suction today  Remo Lipps C. Roxan Hockey, MD [facility] [phone]

## 2020-10-28 NOTE — Plan of Care (Signed)

## 2020-10-28 NOTE — Progress Notes (Signed)
Per Dr. Roxan Hockey, I updated pathology to send molecular.

## 2020-10-28 NOTE — Plan of Care (Signed)

## 2020-10-28 NOTE — Progress Notes (Signed)
The proposed treatment discussed in conference is for discussion purpose only and is not a binding recommendation. The patient was not been physically examined, or presented with their treatment options. Therefore, final treatment plans cannot be decided.  

## 2020-10-29 ENCOUNTER — Inpatient Hospital Stay (HOSPITAL_COMMUNITY): Payer: PRIVATE HEALTH INSURANCE

## 2020-10-29 MED ORDER — KETOROLAC TROMETHAMINE 30 MG/ML IJ SOLN: 30.0000 mg | Freq: Four times a day (QID) | INTRAMUSCULAR | Status: DC

## 2020-10-29 MED ORDER — METHOCARBAMOL 500 MG PO TABS
500.0000 mg | ORAL_TABLET | Freq: Three times a day (TID) | ORAL | Status: DC | PRN
  Administered 2020-10-29 – 2020-10-30 (×3): 500 mg via ORAL
  Filled 2020-10-29 (×3): qty 1

## 2020-10-29 MED ORDER — KETOROLAC TROMETHAMINE 30 MG/ML IJ SOLN
30.0000 mg | Freq: Four times a day (QID) | INTRAMUSCULAR | Status: DC
  Administered 2020-10-29 – 2020-10-30 (×5): 30 mg via INTRAVENOUS
  Filled 2020-10-29 (×5): qty 1

## 2020-10-29 MED ORDER — MYCOPHENOLATE SODIUM 180 MG PO TBEC
720.0000 mg | DELAYED_RELEASE_TABLET | Freq: Two times a day (BID) | ORAL | Status: DC
  Administered 2020-10-29 – 2020-10-30 (×2): 720 mg via ORAL

## 2020-10-29 NOTE — Plan of Care (Signed)

## 2020-10-29 NOTE — Progress Notes (Addendum)
[address]       [place] [address]             [phone]      4 Days Post-Op Procedure(s) (LRB): XI ROBOTIC ASSISTED RIGHT UPPER LOBE THORASCOPY-RIGHT UPPER LOBE WEDGE RESECTION AND RIGHT UPPER LOBECTOMY (Right) LYMPH NODE DISSECTION (Right) INTERCOSTAL NERVE BLOCK (Right) Subjective: Some pain control issues  Objective: Vital signs in last 24 hours: Temp:  [98.1 F (36.7 C)-99.1 F (37.3 C)] 98.1 F (36.7 C) (10/07 0245) Pulse Rate:  [60-69] 65 (10/07 0245) Cardiac Rhythm: Normal sinus rhythm (10/07 0706) Resp:  [11-20] 20 (10/07 0245) BP: (124-143)/(82-91) 143/87 (10/07 0245) SpO2:  [94 %-97 %] 96 % (10/07 0245)  Hemodynamic parameters for last 24 hours:    Intake/Output from previous day: 10/06 0701 - 10/07 0700 In: 600 [P.O.:600] Out: 93 [Chest Tube:93] Intake/Output this shift: No intake/output data recorded.  General appearance: alert, cooperative, and no distress Heart: regular rate and rhythm Lungs: clear to auscultation bilaterally Abdomen: benign Extremities: no edema or calf tenderness Wound: incis healing well  Lab Results: Recent Labs    10/27/20 0150  WBC 5.6  HGB 11.2*  HCT 33.9*  PLT 104*   BMET:  Recent Labs    10/27/20 0150  NA 133*  K 3.6  CL 103  CO2 25  GLUCOSE 118*  BUN 17  CREATININE 0.87  CALCIUM 8.0*    PT/INR: No results for input(s): LABPROT, INR in the last 72 hours. ABG    Component Value Date/Time   PHART 7.435 10/21/2020 1353   HCO3 21.5 10/21/2020 1353   ACIDBASEDEF 2.1 (H) 10/21/2020 1353   O2SAT 97.3 10/21/2020 1353   CBG (last 3)  No results for input(s): GLUCAP in the last 72 hours.  Meds Scheduled Meds:  acetaminophen  1,000 mg Oral Q6H   Or   acetaminophen (TYLENOL) oral liquid 160 mg/5 mL  1,000 mg Oral Q6H   bisacodyl  10 mg Oral Q2200   enoxaparin (LOVENOX) injection  40 mg Subcutaneous Q24H   influenza vac split quadrivalent PF  0.5 mL Intramuscular Tomorrow-1000    multivitamins with iron  1 tablet Oral Daily   mycophenolate  720 mg Oral BID   pantoprazole  40 mg Oral Daily   progesterone  100 mg Oral Daily   senna-docusate  1 tablet Oral QHS   ursodiol  500 mg Oral BID   Continuous Infusions: PRN Meds:.albuterol, fentaNYL (SUBLIMAZE) injection, ketorolac, ondansetron (ZOFRAN) IV, oxyCODONE, polyethylene glycol, traMADol  Xrays DG Chest 1V REPEAT Same Day  Result Date: 10/27/2020 CLINICAL DATA:  Status post right lobectomy. EXAM: CHEST - 1 VIEW SAME DAY COMPARISON:  Same day. FINDINGS: Right-sided chest tube is unchanged in position. Small right apical pneumothorax is noted. Continued presence of subcutaneous emphysema is noted. IMPRESSION: Stable position of right-sided chest tube. Small right apical pneumothorax is noted. Electronically Signed   By: Marijo Conception M.D.   On: 10/27/2020 14:10   DG Chest Port 1 View  Result Date: 10/28/2020 CLINICAL DATA:  Chest tube, pneumothorax, history of lobectomy EXAM: PORTABLE CHEST 1 VIEW COMPARISON:  Radiograph 10/27/2020 FINDINGS: Unchanged cardiomediastinal silhouette. Postsurgical changes of right upper lobectomy. Increased size of the right-sided pneumothorax, measuring up to 2.3 cm, previously up to 0.8 cm. Unchanged position of right apical chest tube. There is no large pleural effusion. The left lung is clear. Bones are unchanged. Unchanged subcutaneous emphysema along the right neck and chest wall.  IMPRESSION: Increased size of the right-sided pneumothorax, measuring 2.3 cm, previously 0.8 cm. Right apical chest tube is in place unchanged in position. Electronically Signed   By: Maurine Simmering M.D.   On: 10/28/2020 09:07    Assessment/Plan: S/P Procedure(s) (LRB): XI ROBOTIC ASSISTED RIGHT UPPER LOBE THORASCOPY-RIGHT UPPER LOBE WEDGE RESECTION AND RIGHT UPPER LOBECTOMY (Right) LYMPH NODE DISSECTION (Right) INTERCOSTAL NERVE BLOCK (Right)  POD#4  1 Tmax 99.1, VSS s BP 120's-140's 2 sats good on  RA 3 CT , minor drainage, 93 cc/ 24/h, small air leak, will leave to suction 4 CXR- ? tiny apical pntx, radiol read pending 5 no new labs, check bmet in am 6 cont pulm toilet/rehab 7 add robaxin      LOS: 4 days    [name] PA-C Pager [phone] 10/29/2020   Patient seen and examined, agree with above Air leak is smaller but not resolved, leave on suction today  Remo Lipps C. Roxan Hockey, MD [facility] [phone]

## 2020-10-30 ENCOUNTER — Inpatient Hospital Stay (HOSPITAL_COMMUNITY): Payer: PRIVATE HEALTH INSURANCE

## 2020-10-30 LAB — BASIC METABOLIC PANEL
Anion gap: 6 (ref 5–15)
BUN: 13 mg/dL (ref 6–20)
CO2: 25 mmol/L (ref 22–32)
Calcium: 8.3 mg/dL — ABNORMAL LOW (ref 8.9–10.3)
Chloride: 105 mmol/L (ref 98–111)
Creatinine, Ser: 0.78 mg/dL (ref 0.44–1.00)
GFR, Estimated: >60 mL/min (ref >60)
Glucose, Bld: 108 mg/dL — ABNORMAL HIGH (ref 70–99)
Potassium: 3.6 mmol/L (ref 3.5–5.1)
Sodium: 136 mmol/L (ref 135–145)

## 2020-10-30 MED ORDER — TRAMADOL HCL 50 MG PO TABS: 50.0000 mg | ORAL_TABLET | Freq: Four times a day (QID) | ORAL | 0 refills | Status: DC | PRN

## 2020-10-30 MED ORDER — METHOCARBAMOL 500 MG PO TABS: 500.0000 mg | ORAL_TABLET | Freq: Three times a day (TID) | ORAL | 0 refills | Status: DC | PRN

## 2020-10-30 NOTE — Discharge Instructions (Signed)

## 2020-10-30 NOTE — Progress Notes (Addendum)
5 Days Post-Op Procedure(s) (LRB): XI ROBOTIC ASSISTED RIGHT UPPER LOBE THORASCOPY-RIGHT UPPER LOBE WEDGE RESECTION AND RIGHT UPPER LOBECTOMY (Right) LYMPH NODE DISSECTION (Right) INTERCOSTAL NERVE BLOCK (Right) Subjective: No complaints this Am, feels well  Objective: Vital signs in last 24 hours: Temp:  [97.8 F (36.6 C)-98.9 F (37.2 C)] 98.4 F (36.9 C) (10/08 0322) Pulse Rate:  [62-69] 69 (10/08 0322) Cardiac Rhythm: Normal sinus rhythm (10/08 0731) Resp:  [15-21] 15 (10/08 0322) BP: (119-137)/(82-91) 121/87 (10/08 0322) SpO2:  [95 %-98 %] 95 % (10/08 0322)  Hemodynamic parameters for last 24 hours:    Intake/Output from previous day: 10/07 0701 - 10/08 0700 In: 600 [P.O.:600] Out: 71 [Stool:1; Chest Tube:70] Intake/Output this shift: No intake/output data recorded.  General appearance: alert, cooperative, and no distress No air leak  Lab Results: No results for input(s): WBC, HGB, HCT, PLT in the last 72 hours. BMET:  Recent Labs    10/30/20 0116  NA 136  K 3.6  CL 105  CO2 25  GLUCOSE 108*  BUN 13  CREATININE 0.78  CALCIUM 8.3*    PT/INR: No results for input(s): LABPROT, INR in the last 72 hours. ABG    Component Value Date/Time   PHART 7.435 10/21/2020 1353   HCO3 21.5 10/21/2020 1353   ACIDBASEDEF 2.1 (H) 10/21/2020 1353   O2SAT 97.3 10/21/2020 1353   CBG (last 3)  No results for input(s): GLUCAP in the last 72 hours.  Assessment/Plan: S/P Procedure(s) (LRB): XI ROBOTIC ASSISTED RIGHT UPPER LOBE THORASCOPY-RIGHT UPPER LOBE WEDGE RESECTION AND RIGHT UPPER LOBECTOMY (Right) LYMPH NODE DISSECTION (Right) INTERCOSTAL NERVE BLOCK (Right) - No air leak this AM on suction- will place tube to water seal and recheck CXR at noon. If no leak and CXR OK will dc chest tube and send home Oncology consultation as outpatient.    LOS: 5 days    [name] 10/30/2020  Repeat CXR shows minimal space at apex. On exam the is significant tidal  motion, but there does appear to be small air leak D/w the Pools. Will change to mini-xpress and dc home Plan to follow up in office Monday afternoon  [place]. Roxan Hockey, MD [facility] [phone]

## 2020-10-30 NOTE — Plan of Care (Signed)

## 2020-11-01 ENCOUNTER — Other Ambulatory Visit: Payer: Self-pay | Admitting: Thoracic Surgery (Cardiothoracic Vascular Surgery)

## 2020-11-01 ENCOUNTER — Ambulatory Visit
Admission: RE | Admit: 2020-11-01 | Discharge: 2020-11-01 | Disposition: A | Discharge status: Home / Self Care | Payer: No Typology Code available for payment source | Source: Ambulatory Visit | Attending: Thoracic Surgery (Cardiothoracic Vascular Surgery) | Admitting: Thoracic Surgery (Cardiothoracic Vascular Surgery)

## 2020-11-01 ENCOUNTER — Ambulatory Visit (INDEPENDENT_AMBULATORY_CARE_PROVIDER_SITE_OTHER): Payer: Self-pay | Admitting: Thoracic Surgery (Cardiothoracic Vascular Surgery)

## 2020-11-01 ENCOUNTER — Other Ambulatory Visit: Payer: Self-pay

## 2020-11-01 ENCOUNTER — Other Ambulatory Visit: Payer: Self-pay | Admitting: *Deleted

## 2020-11-01 VITALS — BP 131/92 | HR 82 | Resp 20 | Ht 67.0 in | Wt 160.0 lb

## 2020-11-01 DIAGNOSIS — Z902 Acquired absence of lung [part of]: Secondary | ICD-10-CM

## 2020-11-01 DIAGNOSIS — Z4682 Encounter for fitting and adjustment of non-vascular catheter: Secondary | ICD-10-CM

## 2020-11-01 NOTE — Progress Notes (Signed)
      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  She had an air leak postoperatively and went home over the weekend with a chest tube in place.  She now returns for follow-up.  She is having some incisional pain.  She has been managing that with tramadol.  She has not had any respiratory issues.   Current Outpatient Medications  Medication Sig Dispense Refill   INTRAROSA 6.5 MG INST Place 1 tablet vaginally daily.     methocarbamol (ROBAXIN) 500 MG tablet Take 1 tablet (500 mg total) by mouth every 8 (eight) hours as needed for up to 5 days for muscle spasms. 15 tablet 0   Multiple Vitamins-Iron (MULTI-VITAMIN/IRON) TABS Take 1 tablet by mouth daily.     mycophenolate (MYFORTIC) 360 MG TBEC EC tablet Take 720 mg by mouth 2 (two) times daily.     progesterone (PROMETRIUM) 100 MG capsule Take 100 mg by mouth daily.     traMADol (ULTRAM) 50 MG tablet Take 1-2 tablets (50-100 mg total) by mouth every 6 (six) hours as needed for up to 5 days (mild pain). 30 tablet 0   ursodiol (ACTIGALL) 500 MG tablet Take 500 mg by mouth 2 (two) times daily.  3   No current facility-administered medications for this visit.    Physical Exam BP (!) 131/92 (BP Location: Right Arm)   Pulse 82   Resp 20   Ht 5\' 7"  (1.702 m)   Wt 160 lb (72.6 kg)   LMP 11/11/2014 (Approximate)   SpO2 97%   BMI 25.06 kg/m  Well-appearing 55 year old woman in no acute distress No airleak Incisions clean, dry and intact  Diagnostic Tests: I reviewed her chest x-ray.  There is a tiny apical space essentially unchanged from previous.  Impression: Barbara Knight is a 55 year old woman who had a robotic assisted right upper lobectomy.  She had an air leak postop and went home with a chest tube in place.  There is no evidence of an air leak with  coughing today.  Procedure: Right chest tube removed without difficulty.  Tolerated well.  Plan: We will get a PA and lateral chest x-ray now.  I will plan to see her back on Wednesday with a repeat chest x-ray just to be on the safe side.  She knows to call if she has any issues in the meantime.  [name], MD [facility] [phone]

## 2020-11-02 ENCOUNTER — Encounter: Payer: No Typology Code available for payment source | Admitting: Genetic Counselor

## 2020-11-03 ENCOUNTER — Ambulatory Visit (INDEPENDENT_AMBULATORY_CARE_PROVIDER_SITE_OTHER): Payer: Self-pay | Admitting: Thoracic Surgery (Cardiothoracic Vascular Surgery)

## 2020-11-03 ENCOUNTER — Ambulatory Visit
Admission: RE | Admit: 2020-11-03 | Discharge: 2020-11-03 | Disposition: A | Discharge status: Home / Self Care | Payer: No Typology Code available for payment source | Source: Ambulatory Visit | Attending: Thoracic Surgery (Cardiothoracic Vascular Surgery) | Admitting: Thoracic Surgery (Cardiothoracic Vascular Surgery)

## 2020-11-03 ENCOUNTER — Other Ambulatory Visit: Payer: Self-pay

## 2020-11-03 VITALS — BP 125/80 | HR 82 | Resp 20 | Ht 67.0 in | Wt 162.0 lb

## 2020-11-03 DIAGNOSIS — J95812 Postprocedural air leak: Secondary | ICD-10-CM

## 2020-11-03 DIAGNOSIS — Z902 Acquired absence of lung [part of]: Secondary | ICD-10-CM

## 2020-11-03 DIAGNOSIS — R911 Solitary pulmonary nodule: Secondary | ICD-10-CM

## 2020-11-03 DIAGNOSIS — Z4682 Encounter for fitting and adjustment of non-vascular catheter: Secondary | ICD-10-CM

## 2020-11-03 MED ORDER — PREGABALIN 25 MG PO CAPS: 25.0000 mg | ORAL_CAPSULE | Freq: Two times a day (BID) | ORAL | 2 refills | Status: DC

## 2020-11-03 NOTE — Progress Notes (Signed)
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  Postoperatively she had an air leak.  She went home with a tube.  I removed that in the office Monday.  She now returns for follow-up.  She continues to have pain.  It really has not improved much since we pulled the tube out.  She has been taking Aleve twice a day, Robaxin, and alternating tramadol and hydrocodone.  She has only been sleeping for a couple hours at a time.  Past Medical History:  Diagnosis Date   Acquired dilation of ascending aorta and aortic root (HCC)    31mm ascending aorta by CTA and echo 03/2020 and 70mm sinus of valsalva on echo   Anemia    Bradycardia 12/16/2014   Elevated LFTs    hepatitis virus was negative   Family history of aortic aneurysm    grandfather had aortic aneurysm repair and paternal uncle died of ruptured aortic aneurysm   Headache(784.0)    migraines   Hepatitis    autoimmune   Mitral valvular prolapse    posterior mitral vavlve leaflet with mild MR   Thyroid disease    Has a thyroid cyst    Current Outpatient Medications  Medication Sig Dispense Refill   INTRAROSA 6.5 MG INST Place 1 tablet vaginally daily.     methocarbamol (ROBAXIN) 500 MG tablet Take 1 tablet (500 mg total) by mouth every 8 (eight) hours as needed for up to 5 days for muscle spasms. 15 tablet 0   Multiple Vitamins-Iron (MULTI-VITAMIN/IRON) TABS Take 1 tablet by mouth daily.     mycophenolate (MYFORTIC) 360 MG TBEC EC tablet Take 720 mg by mouth 2 (two) times daily.     pregabalin (LYRICA) 25 MG capsule Take 1 capsule (25 mg total) by mouth 2 (two) times daily. 60 capsule 2   progesterone (PROMETRIUM) 100 MG capsule Take 100 mg by mouth daily.     traMADol (ULTRAM) 50 MG tablet Take 1-2 tablets (50-100 mg total) by mouth  every 6 (six) hours as needed for up to 5 days (mild pain). 30 tablet 0   ursodiol (ACTIGALL) 500 MG tablet Take 500 mg by mouth 2 (two) times daily.  3   No current facility-administered medications for this visit.    Physical Exam BP 125/80   Pulse 82   Resp 20   Ht 5\' 7"  (1.702 m)   Wt 162 lb (73.5 kg)   LMP 11/11/2014 (Approximate)   SpO2 95% Comment: RA  BMI 25.27 kg/m  55 year old woman in no acute distress but obvious discomfort with movement Lungs diminished at right base but otherwise clear Incisions clean, dry and intact  Diagnostic Tests: I personally reviewed the chest x-ray images.  There are postoperative changes on the right, no pneumothorax.  Impression: Barbara Knight is a 55 year old woman who is now about 9 days out from a right upper lobectomy for stage Ia adenocarcinoma of the lung.  She had an air leak postoperatively.  Her tube was removed 2 days ago and her chest x-ray looks good.  She does have some postoperative pain.  Does not surprising only 9 days out from surgery.  The pain does  seem to have a little bit more of a neuropathic quality to it now.  I recommended we give a trial of Lyrica in addition to the medication she is currently taking.  We will start with a low-dose of 25 mg twice daily.  Plan: Lyrica 25 mg twice daily She has an appointment 11/09/2020 and we will follow-up on the pain at that time. Will make referral to oncology next week as well  [name], MD [facility] [phone]

## 2020-11-04 ENCOUNTER — Other Ambulatory Visit: Payer: Self-pay | Admitting: *Deleted

## 2020-11-04 ENCOUNTER — Other Ambulatory Visit: Payer: Self-pay | Admitting: Physician Assistant

## 2020-11-04 NOTE — Progress Notes (Signed)
The proposed treatment discussed in conference is for discussion purpose only and is not a binding recommendation. The patient was not been physically examined, or presented with their treatment options. Therefore, final treatment plans cannot be decided.  

## 2020-11-08 ENCOUNTER — Other Ambulatory Visit: Payer: Self-pay | Admitting: Thoracic Surgery (Cardiothoracic Vascular Surgery)

## 2020-11-08 DIAGNOSIS — R911 Solitary pulmonary nodule: Secondary | ICD-10-CM

## 2020-11-09 ENCOUNTER — Encounter (HOSPITAL_COMMUNITY): Payer: Self-pay | Admitting: Thoracic Surgery (Cardiothoracic Vascular Surgery)

## 2020-11-09 ENCOUNTER — Other Ambulatory Visit: Payer: Self-pay | Admitting: Thoracic Surgery (Cardiothoracic Vascular Surgery)

## 2020-11-09 ENCOUNTER — Other Ambulatory Visit: Payer: Self-pay

## 2020-11-09 ENCOUNTER — Ambulatory Visit (INDEPENDENT_AMBULATORY_CARE_PROVIDER_SITE_OTHER): Payer: Self-pay | Admitting: Thoracic Surgery (Cardiothoracic Vascular Surgery)

## 2020-11-09 ENCOUNTER — Encounter: Payer: No Typology Code available for payment source | Admitting: Thoracic Surgery (Cardiothoracic Vascular Surgery)

## 2020-11-09 ENCOUNTER — Encounter: Payer: Self-pay | Admitting: Thoracic Surgery (Cardiothoracic Vascular Surgery)

## 2020-11-09 ENCOUNTER — Telehealth: Payer: Self-pay | Admitting: *Deleted

## 2020-11-09 ENCOUNTER — Ambulatory Visit
Admission: RE | Admit: 2020-11-09 | Discharge: 2020-11-09 | Disposition: A | Discharge status: Home / Self Care | Payer: No Typology Code available for payment source | Source: Ambulatory Visit | Attending: Thoracic Surgery (Cardiothoracic Vascular Surgery) | Admitting: Thoracic Surgery (Cardiothoracic Vascular Surgery)

## 2020-11-09 VITALS — BP 128/84 | HR 58 | Resp 20 | Ht 67.0 in

## 2020-11-09 DIAGNOSIS — Z902 Acquired absence of lung [part of]: Secondary | ICD-10-CM

## 2020-11-09 DIAGNOSIS — R911 Solitary pulmonary nodule: Secondary | ICD-10-CM

## 2020-11-09 NOTE — Telephone Encounter (Signed)
I received referral on Barbara Knight.  I called but was unable to reach nor leave vm message. Will call back.

## 2020-11-09 NOTE — Progress Notes (Signed)
[address]       [place] [address]             [phone]    HPI: Barbara Knight returns for scheduled postoperative follow-up visit  Barbara Knight, right upper lobectomy for a stage Ia adenocarcinoma on 10/25/2020.  The nodule was found incidentally on a CT that was done to evaluate for post COVID respiratory symptoms.  She does have a family history with her mother having metastatic EGFR positive adenocarcinoma of the lung.  She had air leak initially.  She went home with a chest tube which we were able to get out on 11/01/2020.  She continues to have some incisional pain.  She is using Lyrica 50 mg twice daily.  She is still using tramadol occasionally but has been able to cut back on that.  Also using Robaxin.  She does complain that she has some more frequent than normal coughing.  Does have a lot of incisional pain when she coughs.  Also complains of not having the respiratory capacity she had previously.  Past Medical History:  Diagnosis Date   Acquired dilation of ascending aorta and aortic root (HCC)    42m ascending aorta by CTA and echo 03/2020 and 436msinus of valsalva on echo   Anemia    Bradycardia 12/16/2014   Elevated LFTs    hepatitis virus was negative   Family history of aortic aneurysm    grandfather had aortic aneurysm repair and paternal uncle died of ruptured aortic aneurysm   Headache(784.0)    migraines   Hepatitis    autoimmune   Mitral valvular prolapse    posterior mitral vavlve leaflet with mild MR   Thyroid disease    Has a thyroid cyst     Current Outpatient Medications  Medication Sig Dispense Refill   INTRAROSA 6.5 MG INST Place 1 tablet vaginally daily.     methocarbamol (ROBAXIN) 500 MG tablet Take 1 tablet (500 mg total) by mouth every 8 (eight) hours as needed for up to 5 days for muscle spasms. 15 tablet 0   Multiple Vitamins-Iron (MULTI-VITAMIN/IRON) TABS Take 1 tablet by mouth daily.     mycophenolate (MYFORTIC)  360 MG TBEC EC tablet Take 720 mg by mouth 2 (two) times daily.     pregabalin (LYRICA) 25 MG capsule Take 1 capsule (25 mg total) by mouth 2 (two) times daily. 60 capsule 2   progesterone (PROMETRIUM) 100 MG capsule Take 100 mg by mouth daily.     traMADol (ULTRAM) 50 MG tablet Take 1-2 tablets (50-100 mg total) by mouth every 6 (six) hours as needed for up to 5 days (mild pain). 28 tablet 0   ursodiol (ACTIGALL) 500 MG tablet Take 500 mg by mouth 2 (two) times daily.  3   No current facility-administered medications for this visit.    Physical Exam BP 128/84 (BP Location: Left Arm, Patient Position: Sitting)   Pulse (!) 58   Resp 20   Ht _0  (1.702 m)   LMP 11/11/2014 (Approximate)   SpO2 98% Comment: RA  BMI 25.3759g/m  [age]ear old woman in no acute distress Alert and oriented x3 with no focal deficits Incisions clean dry and intact, mild rash from tape from bandage Lungs diminished at right base but otherwise clear  Diagnostic Tests: I personally reviewed the chest x-ray images.  Normal postoperative changes.  Impression: Barbara Knight a [age]ear old woman with a history of  an autoimmune hepatitis, mitral prolapse, and now stage Ia adenocarcinoma of the lung.  I think she is making excellent progress.  She is a little frustrated that she is not further along.  However, she really is doing quite well.  All of the symptoms she is experiencing are typical for this type of surgery and should improve over the next several weeks.  Continue Lyrica 50 mg twice daily.  Continue tramadol as needed.  She will call if she needs more of that.  She is okay to drive on a limited basis.  Appropriate precautions were discussed.  She can gradually increase her activities as tolerated.  Given her mother's history of dying of EGFR positive stage IV adenocarcinoma of the lung with brain metastases, will order MRI of the brain to complete her staging work-up.  Plan: MR brain Will refer to  Dr. Julien Nordmann at our multidisciplinary thoracic oncology clinic for oncology consultation Return in 2 weeks with PA lateral chest x-ray  [name], MD [facility] [phone]

## 2020-11-10 ENCOUNTER — Telehealth: Payer: Self-pay | Admitting: *Deleted

## 2020-11-10 DIAGNOSIS — Z902 Acquired absence of lung [part of]: Secondary | ICD-10-CM

## 2020-11-10 NOTE — Telephone Encounter (Signed)
I received referral on Barbara Knight yesterday from Dr. Leonarda Salon office. I called and updated her on appt.  [facility] one results are completed and I have printed and placed on Dr. Worthy Flank desk.

## 2020-11-12 ENCOUNTER — Ambulatory Visit
Admission: RE | Admit: 2020-11-12 | Discharge: 2020-11-12 | Disposition: A | Discharge status: Home / Self Care | Payer: Self-pay | Source: Ambulatory Visit | Attending: Internal Medicine | Admitting: Internal Medicine

## 2020-11-12 ENCOUNTER — Encounter: Payer: Self-pay | Admitting: *Deleted

## 2020-11-12 DIAGNOSIS — R911 Solitary pulmonary nodule: Secondary | ICD-10-CM

## 2020-11-12 NOTE — Progress Notes (Signed)
I re-faxed request for recent images to be pushed into PACS with a confirmation fax received. I called [facility] to update them on scan being uploaded to PACS.

## 2020-11-12 NOTE — Progress Notes (Signed)
I received a message that patient would like Dr. Julien Nordmann to have her recent scans from [facility] so he can review on Monday at her new patient appt. I faxed request to [facility] radiology but line was busy at this time. I will try again later. I updated Dr. Julien Nordmann and will complete the outside image order.

## 2020-11-13 ENCOUNTER — Other Ambulatory Visit: Payer: Self-pay | Admitting: Thoracic Surgery (Cardiothoracic Vascular Surgery)

## 2020-11-14 ENCOUNTER — Other Ambulatory Visit: Payer: No Typology Code available for payment source

## 2020-11-15 ENCOUNTER — Encounter: Payer: Self-pay | Admitting: *Deleted

## 2020-11-15 ENCOUNTER — Other Ambulatory Visit: Payer: Self-pay | Admitting: *Deleted

## 2020-11-15 ENCOUNTER — Inpatient Hospital Stay: Payer: No Typology Code available for payment source | Attending: Internal Medicine | Admitting: Internal Medicine

## 2020-11-15 ENCOUNTER — Encounter: Payer: Self-pay | Admitting: Internal Medicine

## 2020-11-15 ENCOUNTER — Other Ambulatory Visit: Payer: Self-pay

## 2020-11-15 ENCOUNTER — Inpatient Hospital Stay: Payer: No Typology Code available for payment source

## 2020-11-15 VITALS — BP 121/78 | HR 64 | Temp 98.5°F | Resp 19 | Ht 67.0 in | Wt 161.4 lb

## 2020-11-15 DIAGNOSIS — I341 Nonrheumatic mitral (valve) prolapse: Secondary | ICD-10-CM | POA: Insufficient documentation

## 2020-11-15 DIAGNOSIS — C3411 Malignant neoplasm of upper lobe, right bronchus or lung: Secondary | ICD-10-CM

## 2020-11-15 DIAGNOSIS — K754 Autoimmune hepatitis: Secondary | ICD-10-CM | POA: Diagnosis not present

## 2020-11-15 DIAGNOSIS — J439 Emphysema, unspecified: Secondary | ICD-10-CM | POA: Diagnosis not present

## 2020-11-15 DIAGNOSIS — C3490 Malignant neoplasm of unspecified part of unspecified bronchus or lung: Secondary | ICD-10-CM

## 2020-11-15 DIAGNOSIS — Z902 Acquired absence of lung [part of]: Secondary | ICD-10-CM

## 2020-11-15 DIAGNOSIS — Z8616 Personal history of COVID-19: Secondary | ICD-10-CM | POA: Insufficient documentation

## 2020-11-15 DIAGNOSIS — Z801 Family history of malignant neoplasm of trachea, bronchus and lung: Secondary | ICD-10-CM | POA: Diagnosis not present

## 2020-11-15 DIAGNOSIS — Z79899 Other long term (current) drug therapy: Secondary | ICD-10-CM | POA: Diagnosis not present

## 2020-11-15 DIAGNOSIS — C3491 Malignant neoplasm of unspecified part of right bronchus or lung: Secondary | ICD-10-CM | POA: Diagnosis not present

## 2020-11-15 LAB — CMP (CANCER CENTER ONLY)
ALT: 28 U/L (ref 0–44)
AST: 31 U/L (ref 15–41)
Albumin: 3.8 g/dL (ref 3.5–5.0)
Alkaline Phosphatase: 146 U/L — ABNORMAL HIGH (ref 38–126)
Anion gap: 9 (ref 5–15)
BUN: 18 mg/dL (ref 6–20)
CO2: 24 mmol/L (ref 22–32)
Calcium: 8.7 mg/dL — ABNORMAL LOW (ref 8.9–10.3)
Chloride: 105 mmol/L (ref 98–111)
Creatinine: 0.87 mg/dL (ref 0.44–1.00)
GFR, Estimated: >60 mL/min (ref >60)
Glucose, Bld: 129 mg/dL — ABNORMAL HIGH (ref 70–99)
Potassium: 3.8 mmol/L (ref 3.5–5.1)
Sodium: 138 mmol/L (ref 135–145)
Total Bilirubin: 0.9 mg/dL (ref 0.3–1.2)
Total Protein: 7 g/dL (ref 6.5–8.1)

## 2020-11-15 LAB — CBC WITH DIFFERENTIAL (CANCER CENTER ONLY)
Abs Immature Granulocytes: 0.01 10*3/uL (ref 0.00–0.07)
Basophils Absolute: 0 10*3/uL (ref 0.0–0.1)
Basophils Relative: 1 %
Eosinophils Absolute: 0.2 10*3/uL (ref 0.0–0.5)
Eosinophils Relative: 3 %
HCT: 38.7 % (ref 36.0–46.0)
Hemoglobin: 12.7 g/dL (ref 12.0–15.0)
Immature Granulocytes: 0 %
Lymphocytes Relative: 28 %
Lymphs Abs: 1.4 10*3/uL (ref 0.7–4.0)
MCH: 28.7 pg (ref 26.0–34.0)
MCHC: 32.8 g/dL (ref 30.0–36.0)
MCV: 87.6 fL (ref 80.0–100.0)
Monocytes Absolute: 0.6 10*3/uL (ref 0.1–1.0)
Monocytes Relative: 12 %
Neutro Abs: 2.8 10*3/uL (ref 1.7–7.7)
Neutrophils Relative %: 56 %
Platelet Count: 176 10*3/uL (ref 150–400)
RBC: 4.42 MIL/uL (ref 3.87–5.11)
RDW: 13.9 % (ref 11.5–15.5)
WBC Count: 4.9 10*3/uL (ref 4.0–10.5)
nRBC: 0 % (ref 0.0–0.2)

## 2020-11-15 MED ORDER — TRAMADOL HCL 50 MG PO TABS: 50.0000 mg | ORAL_TABLET | Freq: Four times a day (QID) | ORAL | 0 refills | Status: DC | PRN

## 2020-11-15 NOTE — Progress Notes (Signed)
I called [facility] to check on images being pushed. [facility] did not upload patients images. [facility] with contact them stat and get images uploaded.

## 2020-11-15 NOTE — Progress Notes (Signed)
[facility] [phone]   [phone]  CONSULT NOTE  REFERRING PHYSICIAN: Dr. Modesto Charon  REASON FOR CONSULTATION:  55 years old white female recently diagnosed with lung cancer.  HPI Barbara Knight is a 55 y.o. female with a past medical history significant for thoracic aneurysm, anemia, bradycardia, autoimmune hepatitis, mitral valve prolapse as well as history of thyroid cyst.  She is a never smoker who was diagnosed with COVID-19 in January 2022.  She continues to have shortness of breath and no complete recovery to her baseline since her COVID infection.  She was seen by pulmonary medicine by Dr. Chase Caller and CT scan of the chest was performed on 07/01/2020.  It showed soft solid nodule in the apical segment of the right upper lobe measuring 1.0 x 1.2 cm and has a 3.3 cm solid component.  There was also a vague area of nodular groundglass in the medial right lower lobe that appear less nodular and measuring 0.6 cm and stable in size. The patient was referred to Dr. Valeta Harms and she had CT super D of the chest on 10/08/2020 and that showed increase in the size of the soft solid nodular opacity in the right upper lobe measuring 1.4 x 0.9 cm with central more solid-appearing nodular component measuring 0.4 cm.  No other pulmonary lesions identified.  The scan showed no mediastinal or hilar adenopathy.  The patient also underwent ultrasound fine-needle aspiration of a suspicious right-sided thyroid nodule on 10/13/2020 and that showed atypia of undetermined significance.  On October 25, 2020 the patient underwent flexible video fiberoptic bronchoscopy with robotic assistance and biopsies under the care of Dr. Valeta Harms followed by robotic right VATS, wedge resection of the right upper lobe followed by right upper lobectomy with lymph node dissection under the care of Dr. Roxan Hockey.  The final pathology [id number]) showed adenocarcinoma measuring 1.2 cm with  negative margins and the dissected lymph nodes were negative for malignancy.  The patient had molecular studies by [facility] and it showed positive ALK -EML4 fusion with negative PD-L1 expression. The patient was referred to me today for evaluation and recommendation regarding her condition. When seen today she continues to have soreness on the right side of the chest secondary to the surgical scar.  She also has shortness of breath with exertion and dry cough.  She denied having any hemoptysis.  She denied having any nausea, vomiting, diarrhea or constipation.  She has no headache or visual changes.  She is scheduled to have MRI of the brain next week. Family history significant for mother who was also never smoker and diagnosed with lung cancer with positive EGFR mutation.  Father is 62 and has hypertension. The patient is married and has 4 children.  She used to work as a Microbiologist.  She was accompanied by her husband Dr. Annette Stable, neurosurgeon in town.  The patient has no history of smoking, alcohol or drug abuse. HPI  Past Medical History:  Diagnosis Date   Acquired dilation of ascending aorta and aortic root (HCC)    57m ascending aorta by CTA and echo 03/2020 and 459msinus of valsalva on echo   Anemia    Bradycardia 12/16/2014   Elevated LFTs    hepatitis virus was negative   Family history of aortic aneurysm    grandfather had aortic aneurysm repair and paternal uncle died of ruptured aortic aneurysm   Headache(784.0)    migraines   Hepatitis    autoimmune  Mitral valvular prolapse    posterior mitral vavlve leaflet with mild MR   Thyroid disease    Has a thyroid cyst    Past Surgical History:  Procedure Laterality Date   BRONCHIAL BIOPSY  10/25/2020   Procedure: BRONCHIAL BIOPSIES;  Surgeon: Garner Nash, DO;  Location: [facility];  Service: Pulmonary;;   BRONCHIAL BRUSHINGS  10/25/2020   Procedure: BRONCHIAL BRUSHINGS;  Surgeon: Garner Nash, DO;  Location: [facility];  Service: Pulmonary;;   DILATATION & CURETTAGE/HYSTEROSCOPY WITH TRUECLEAR N/A 09/13/2012   Procedure: DILATATION & CURETTAGE/HYSTEROSCOPY WITH TRUECLEAR and THERMACHOICE;  Surgeon: Cyril Mourning, MD;  Location: [facility];  Service: Gynecology;  Laterality: N/A;   DILATATION & CURRETTAGE/HYSTEROSCOPY WITH RESECTOCOPE N/A 09/13/2012   Procedure: [place];  Surgeon: Cyril Mourning, MD;  Location: [facility];  Service: Gynecology;  Laterality: N/A;   DILATION AND CURETTAGE OF UTERUS     INTERCOSTAL NERVE BLOCK Right 10/25/2020   Procedure: INTERCOSTAL NERVE BLOCK;  Surgeon: [name], MD;  Location: [facility];  Service: Thoracic;  Laterality: Right;   LIVER BIOPSY     x 2   LYMPH NODE DISSECTION Right 10/25/2020   Procedure: LYMPH NODE DISSECTION;  Surgeon: [name], MD;  Location: [facility];  Service: Thoracic;  Laterality: Right;   SUBMUCOSAL TATTOO INJECTION  10/25/2020   Procedure: SUBMUCOSAL TATTOO INJECTION;  Surgeon: Garner Nash, DO;  Location: [facility];  Service: Pulmonary;;   UTERINE FIBROID SURGERY     VIDEO BRONCHOSCOPY WITH ENDOBRONCHIAL NAVIGATION Right 10/25/2020   Procedure: VIDEO BRONCHOSCOPY WITH ENDOBRONCHIAL NAVIGATION;  Surgeon: Garner Nash, DO;  Location: [facility];  Service: Pulmonary;  Laterality: Right;  ION + Fiducial Dye Marking    Family History  Problem Relation Age of Onset   Lung cancer Mother    Barrett's esophagus Mother    Hypertension Father    Aortic aneurysm Paternal Grandfather        s/p repair   Aortic aneurysm Paternal Uncle        died of ruptured aortic aneurysm   Colon cancer Neg Hx    Colon polyps Neg Hx    Esophageal cancer Neg Hx    Rectal cancer Neg Hx    Stomach cancer Neg Hx     Social History Social History   Tobacco Use   Smoking status: Never   Smokeless tobacco: Never  Vaping Use   Vaping Use: Never used  Substance Use Topics   Alcohol use:  Not Currently    Comment: stopped 10/21   Drug use: No    No Known Allergies  Current Outpatient Medications  Medication Sig Dispense Refill   pregabalin (LYRICA) 25 MG capsule Take 50 mg by mouth in the morning and at bedtime.     INTRAROSA 6.5 MG INST Place 1 tablet vaginally daily.     Multiple Vitamins-Iron (MULTI-VITAMIN/IRON) TABS Take 1 tablet by mouth daily.     mycophenolate (MYFORTIC) 360 MG TBEC EC tablet Take 720 mg by mouth 2 (two) times daily.     progesterone (PROMETRIUM) 100 MG capsule Take 100 mg by mouth daily.     traMADol (ULTRAM) 50 MG tablet Take 1-2 tablets (50-100 mg total) by mouth every 6 (six) hours as needed. 28 tablet 0   ursodiol (ACTIGALL) 500 MG tablet Take 500 mg by mouth 2 (two) times daily.  3   No current facility-administered medications for this visit.    Review  of Systems  Constitutional: positive for fatigue Eyes: negative Ears, nose, mouth, throat, and face: negative Respiratory: positive for cough, dyspnea on exertion, and pleurisy/chest pain Cardiovascular: negative Gastrointestinal: negative Genitourinary:negative Integument/breast: negative Hematologic/lymphatic: negative Musculoskeletal:negative Neurological: negative Behavioral/Psych: negative Endocrine: negative Allergic/Immunologic: negative  Physical Exam  WUX:LKGMW, healthy, no distress, well nourished, and well developed SKIN: skin color, texture, turgor are normal, no rashes or significant lesions HEAD: Normocephalic, No masses, lesions, tenderness or abnormalities EYES: normal, PERRLA, Conjunctiva are pink and non-injected EARS: External ears normal, Canals clear OROPHARYNX:no exudate, no erythema, and lips, buccal mucosa, and tongue normal  NECK: supple, no adenopathy, no JVD LYMPH:  no palpable lymphadenopathy, no hepatosplenomegaly BREAST:not examined LUNGS: clear to auscultation , and palpation HEART: regular rate & rhythm, no murmurs, and no  gallops ABDOMEN:abdomen soft, non-tender, normal bowel sounds, and no masses or organomegaly BACK: No CVA tenderness, Range of motion is normal EXTREMITIES:no joint deformities, effusion, or inflammation, no edema  NEURO: alert & oriented x 3 with fluent speech, no focal motor/sensory deficits  PERFORMANCE STATUS: ECOG 1  LABORATORY DATA: Lab Results  Component Value Date   WBC 4.9 11/15/2020   HGB 12.7 11/15/2020   HCT 38.7 11/15/2020   MCV 87.6 11/15/2020   PLT 176 11/15/2020      Chemistry      Component Value Date/Time   NA 138 11/15/2020 1349   NA 141 04/07/2020 1542   K 3.8 11/15/2020 1349   CL 105 11/15/2020 1349   CO2 24 11/15/2020 1349   BUN 18 11/15/2020 1349   BUN 19 04/07/2020 1542   CREATININE 0.87 11/15/2020 1349   CREATININE 0.99 08/04/2015 0842      Component Value Date/Time   CALCIUM 8.7 (L) 11/15/2020 1349   ALKPHOS 146 (H) 11/15/2020 1349   AST 31 11/15/2020 1349   ALT 28 11/15/2020 1349   BILITOT 0.9 11/15/2020 1349       RADIOGRAPHIC STUDIES: DG Chest 2 View  Result Date: 11/09/2020 CLINICAL DATA:  lung nodule resected; demonstrated adenocarcinoma EXAM: CHEST - 2 VIEW COMPARISON:  November 03, 2020 FINDINGS: The cardiomediastinal silhouette is unchanged in contour with unchanged postsurgical appearance of the RIGHT hilum.Surgical sutures project over the upper RIGHT lung. Elevation of the RIGHT hemidiaphragm. No pleural effusion. Possible trace residual RIGHT apical pneumothorax, decreased since prior. No significant pneumothorax. No acute pleuroparenchymal abnormality. Visualized abdomen is unremarkable. IMPRESSION: Stable postsurgical appearance status post RIGHT upper lobectomy. There may be a decreased trace RIGHT apical pneumothorax; no sizable pneumothorax is visualized. Electronically Signed   By: Valentino Saxon M.D.   On: 11/09/2020 14:29   DG Chest 2 View  Result Date: 11/03/2020 CLINICAL DATA:  Chest tube removal. EXAM: CHEST - 2  VIEW COMPARISON:  November 01, 2020. FINDINGS: The heart size and mediastinal contours are within normal limits. Left lung is clear. Minimal right apical pneumothorax is noted which is smaller compared to prior exam. Postsurgical changes are noted in right upper lobe. The visualized skeletal structures are unremarkable. IMPRESSION: Minimal right apical pneumothorax is noted which is decreased compared to prior exam. Electronically Signed   By: Marijo Conception M.D.   On: 11/03/2020 15:47   DG Chest 2 View  Result Date: 11/01/2020 CLINICAL DATA:  Status post right upper lobectomy 10/25/2020, right chest tube, right pneumothorax EXAM: CHEST - 2 VIEW COMPARISON:  10/30/2020 FINDINGS: Frontal and lateral views of the chest demonstrates stable right chest tube. Persistent trace right apical pneumothorax volume estimated far less than  5%. The subcutaneous gas throughout the right chest wall is unchanged. Postsurgical changes from right upper lobectomy with right-sided volume loss. Left chest is clear. No pleural effusion. No acute bony abnormalities. IMPRESSION: 1. Persistent trace right apical pneumothorax volume estimated far less than 5%. 2. Stable right chest tube. 3. Continued subcutaneous gas throughout the right chest wall. Electronically Signed   By: Randa Ngo M.D.   On: 11/01/2020 16:01   DG Chest 2 View  Result Date: 10/22/2020 CLINICAL DATA:  56 year old female under preoperative evaluation for pulmonary nodule. EXAM: CHEST - 2 VIEW COMPARISON:  Chest x-ray 04/07/2020.  Chest CT 10/08/2020. FINDINGS: Lung volumes are normal. No consolidative airspace disease. No pleural effusions. No pneumothorax. No pulmonary nodule or mass noted. Pulmonary vasculature and the cardiomediastinal silhouette are within normal limits. Atherosclerosis in the thoracic aorta. IMPRESSION: 1. No radiographic evidence of acute cardiopulmonary disease. Known sub solid apical nodule in the right upper lobe not well  demonstrated on today's plain film examination. Electronically Signed   By: Vinnie Langton M.D.   On: 10/22/2020 16:44   DG Chest 2V REPEAT Same day  Result Date: 11/01/2020 CLINICAL DATA:  Chest tube removal.  Status post lobectomy. EXAM: CHEST - 2 VIEW SAME DAY COMPARISON:  Same day. FINDINGS: The heart size and mediastinal contours are within normal limits. Left lung is clear. Minimal right apical pneumothorax is noted status post chest tube removal. Stable mild amount of subcutaneous emphysema is noted. Postsurgical changes are noted in right upper lobe. The visualized skeletal structures are unremarkable. IMPRESSION: Minimal right apical pneumothorax status post right-sided chest tube removal. Electronically Signed   By: Marijo Conception M.D.   On: 11/01/2020 16:42   DG Chest 1V REPEAT Same Day  Result Date: 10/30/2020 CLINICAL DATA:  Status post lobectomy. EXAM: CHEST - 1 VIEW SAME DAY COMPARISON:  October 30, 2020 FINDINGS: Status post right upper lobectomy. Postsurgical changes noted in the upper right thorax. Right chest tube remains in place. Persistent small right pneumothorax. Persistent right chest wall emphysema. The left lung is clear. Normal cardiac silhouette.  Tortuosity of the aorta. IMPRESSION: 1. Persistent small right pneumothorax. 2. Right chest tube remains in place. Electronically Signed   By: Fidela Salisbury M.D.   On: 10/30/2020 14:00   DG Chest 1V REPEAT Same Day  Result Date: 10/27/2020 CLINICAL DATA:  Status post right lobectomy. EXAM: CHEST - 1 VIEW SAME DAY COMPARISON:  Same day. FINDINGS: Right-sided chest tube is unchanged in position. Small right apical pneumothorax is noted. Continued presence of subcutaneous emphysema is noted. IMPRESSION: Stable position of right-sided chest tube. Small right apical pneumothorax is noted. Electronically Signed   By: Marijo Conception M.D.   On: 10/27/2020 14:10   DG Chest Port 1 View  Result Date: 10/30/2020 CLINICAL DATA:   Dyspnea EXAM: PORTABLE CHEST 1 VIEW COMPARISON:  Chest radiograph from one day prior. FINDINGS: Stable medial right apical chest tube. Surgical sutures overlie the apical right lung. Subcutaneous emphysema throughout the lateral right chest wall and lower right neck, similar. Stable cardiomediastinal silhouette with normal heart size. Tiny right apical pneumothorax, slightly decreased, less than 5%. No left pneumothorax. No pleural effusion. Stable volume loss in the right hemithorax with right mediastinal shift. Clear lungs. IMPRESSION: 1. Tiny right apical pneumothorax, slightly decreased, less than 5%. Stable right apical chest tube. 2. Stable volume loss in the right hemithorax with right mediastinal shift. Electronically Signed   By: Janina Mayo.D.  On: 10/30/2020 10:06   DG CHEST PORT 1 VIEW  Result Date: 10/29/2020 CLINICAL DATA:  Status post lobectomy. EXAM: PORTABLE CHEST 1 VIEW COMPARISON:  October 28, 2020. FINDINGS: The heart size and mediastinal contours are within normal limits. Right-sided chest tube is unchanged in position. Small right apical pneumothorax is noted which is decreased compared to prior exam. Left lung is clear. Stable subcutaneous emphysema is seen over right lateral chest wall. The visualized skeletal structures are unremarkable. IMPRESSION: Stable position of right-sided chest tube. Small right apical pneumothorax is noted which is decreased compared to prior exam. Stable subcutaneous emphysema. Electronically Signed   By: Marijo Conception M.D.   On: 10/29/2020 08:51   DG Chest Port 1 View  Result Date: 10/28/2020 CLINICAL DATA:  Chest tube, pneumothorax, history of lobectomy EXAM: PORTABLE CHEST 1 VIEW COMPARISON:  Radiograph 10/27/2020 FINDINGS: Unchanged cardiomediastinal silhouette. Postsurgical changes of right upper lobectomy. Increased size of the right-sided pneumothorax, measuring up to 2.3 cm, previously up to 0.8 cm. Unchanged position of right apical chest  tube. There is no large pleural effusion. The left lung is clear. Bones are unchanged. Unchanged subcutaneous emphysema along the right neck and chest wall. IMPRESSION: Increased size of the right-sided pneumothorax, measuring 2.3 cm, previously 0.8 cm. Right apical chest tube is in place unchanged in position. Electronically Signed   By: Maurine Simmering M.D.   On: 10/28/2020 09:07   DG CHEST PORT 1 VIEW  Result Date: 10/27/2020 CLINICAL DATA:  Post right lobectomy.  Follow-up exam. EXAM: PORTABLE CHEST 1 VIEW COMPARISON:  10/26/2020 and earlier studies. FINDINGS: Pneumothorax noted on the previous day's exam is no longer appreciated. Right chest tube has its tip at the apex, stable. Right upper lung and perihilar pulmonary anastomosis staple lines are stable. There is opacity along the medial aspect of the right upper lung and overlying the right hilum, consistent with atelectasis. Remainder of the right lung is clear. Left lung is clear. Right anterolateral chest wall and neck base subcutaneous emphysema is similar to the previous day's exam. IMPRESSION: 1. Right lung now fully re-expanded. No residual pneumothorax appreciated. 2. Right perihilar and medial upper lung opacity, similar to the previous day's exam, consistent with atelectasis. Remainder of the lungs is clear. 3. Stable right chest tube. 4. No change in the fairly extensive right sided subcutaneous emphysema. Electronically Signed   By: Lajean Manes M.D.   On: 10/27/2020 09:03   DG Chest Port 1 View  Result Date: 10/26/2020 CLINICAL DATA:  Status post right lobectomy. EXAM: PORTABLE CHEST 1 VIEW COMPARISON:  Same day. FINDINGS: The heart size and mediastinal contours are within normal limits. Left lung is clear. Right-sided chest tube is again noted. Increased subcutaneous emphysema is seen over right lateral chest wall and right supraclavicular region. Moderate right apical pneumothorax is noted which is increased compared to prior exam. The  visualized skeletal structures are unremarkable. IMPRESSION: Moderate right apical pneumothorax is noted which is increased compared to prior exam. Right-sided chest tube is again noted. Cutaneous emphysema is noted. Electronically Signed   By: Marijo Conception M.D.   On: 10/26/2020 09:22   DG Chest Port 1 View  Result Date: 10/25/2020 CLINICAL DATA:  Status post right upper lobectomy EXAM: PORTABLE CHEST 1 VIEW COMPARISON:  Film from 10/21/2020 FINDINGS: Cardiac shadow is within normal limits. Left lung is well aerated and clear. New right-sided chest tube is noted with the tip in the apex. Changes of prior right upper lobectomy  are noted. Mild pneumothorax is noted in the apex related to incomplete re-expansion of the right lung. Follow-up films are recommended. No bony abnormality is seen. IMPRESSION: Small apical pneumothorax on the right likely related to incomplete re-expansion of the right lung following right upper lobectomy. Chest tube is noted in satisfactory position. Electronically Signed   By: Inez Catalina M.D.   On: 10/25/2020 20:07   DG C-ARM BRONCHOSCOPY  Result Date: 10/25/2020 C-ARM BRONCHOSCOPY: Fluoroscopy was utilized by the requesting physician.  No radiographic interpretation.    ASSESSMENT: This is a very pleasant 55 years old white female recently diagnosed with stage IA (T1b, N0, M0) non-small cell lung cancer, adenocarcinoma presented with right upper lobe lung nodule status post right upper lobectomy with lymph node dissection under the care of Dr. Roxan Hockey on October 25, 2020. Molecular studies showed positive ALK-EML4 gene translocation with negative PD-L1 expression.   PLAN: I had a lengthy discussion with the patient and her husband today about her current disease stage, prognosis and treatment options. I explained to the patient that the 5-year survival for patient with a stage Ia non-small cell lung cancer is around 80% after surgical resection.  I also explained to  the patient that there is no indication for adjuvant systemic chemotherapy or radiation after her surgery. The patient has ALK gene translocation but there is no current indication for adjuvant targeted therapy for her early stage disease. I recommended for the patient to complete the staging work-up by ordering a PET scan as well as MRI of the brain to rule out any other metastatic disease. I recommended for the patient to continue on observation with close monitoring and repeat CT scan of the chest in 6 months. The patient and her husband had several questions and I answered them completely to their satisfaction. Regarding the autoimmune hepatitis, she is followed by gastroenterology at [facility] and she is supposed to have repeat MRI of the liver and few months. She was advised to call immediately if she has any other concerning symptoms in the interval.  The patient voices understanding of current disease status and treatment options and is in agreement with the current care plan.  All questions were answered. The patient knows to call the clinic with any problems, questions or concerns. We can certainly see the patient much sooner if necessary.  Thank you so much for allowing me to participate in the care of [facility]. I will continue to follow up the patient with you and assist in her care.  The total time spent in the appointment was 60 minutes.  Disclaimer: This note was dictated with voice recognition software. Similar sounding words can inadvertently be transcribed and may not be corrected upon review.   Eilleen Kempf November 15, 2020, 2:57 PM

## 2020-11-16 ENCOUNTER — Encounter: Payer: Self-pay | Admitting: *Deleted

## 2020-11-16 NOTE — Progress Notes (Signed)
I followed up to check on the status of Barbara Knight's PET scan insurance authorization.  I was unclear if this had been completed. I reached out to authorization team to check.  Wait for response.

## 2020-11-17 ENCOUNTER — Encounter (HOSPITAL_COMMUNITY): Payer: Self-pay

## 2020-11-17 ENCOUNTER — Ambulatory Visit
Admission: RE | Admit: 2020-11-17 | Discharge: 2020-11-17 | Disposition: A | Discharge status: Home / Self Care | Payer: No Typology Code available for payment source | Source: Ambulatory Visit | Attending: Thoracic Surgery (Cardiothoracic Vascular Surgery) | Admitting: Thoracic Surgery (Cardiothoracic Vascular Surgery)

## 2020-11-17 ENCOUNTER — Other Ambulatory Visit: Payer: No Typology Code available for payment source

## 2020-11-17 ENCOUNTER — Other Ambulatory Visit: Payer: Self-pay

## 2020-11-17 DIAGNOSIS — Z902 Acquired absence of lung [part of]: Secondary | ICD-10-CM

## 2020-11-17 MED ORDER — GADOBENATE DIMEGLUMINE 529 MG/ML IV SOLN
15.0000 mL | Freq: Once | INTRAVENOUS | Status: AC | PRN
  Administered 2020-11-17: 15 mL via INTRAVENOUS

## 2020-11-18 ENCOUNTER — Encounter: Payer: Self-pay | Admitting: *Deleted

## 2020-11-18 ENCOUNTER — Other Ambulatory Visit: Payer: No Typology Code available for payment source

## 2020-11-18 NOTE — Progress Notes (Signed)
I received a fax from [facility] stating they needed more information to push scans into PACS.  I faxed the updated information and received a confirmation fax back.

## 2020-11-19 ENCOUNTER — Other Ambulatory Visit: Payer: Self-pay | Admitting: Thoracic Surgery (Cardiothoracic Vascular Surgery)

## 2020-11-22 ENCOUNTER — Encounter (HOSPITAL_COMMUNITY): Payer: Self-pay

## 2020-11-22 ENCOUNTER — Other Ambulatory Visit: Payer: No Typology Code available for payment source

## 2020-11-22 ENCOUNTER — Other Ambulatory Visit: Payer: Self-pay | Admitting: Thoracic Surgery (Cardiothoracic Vascular Surgery)

## 2020-11-22 DIAGNOSIS — R911 Solitary pulmonary nodule: Secondary | ICD-10-CM

## 2020-11-22 DIAGNOSIS — C3491 Malignant neoplasm of unspecified part of right bronchus or lung: Secondary | ICD-10-CM

## 2020-11-22 DIAGNOSIS — Z902 Acquired absence of lung [part of]: Secondary | ICD-10-CM

## 2020-11-23 ENCOUNTER — Other Ambulatory Visit: Payer: Self-pay

## 2020-11-23 ENCOUNTER — Ambulatory Visit
Admission: RE | Admit: 2020-11-23 | Discharge: 2020-11-23 | Disposition: A | Discharge status: Home / Self Care | Payer: No Typology Code available for payment source | Source: Ambulatory Visit | Attending: Thoracic Surgery (Cardiothoracic Vascular Surgery) | Admitting: Thoracic Surgery (Cardiothoracic Vascular Surgery)

## 2020-11-23 ENCOUNTER — Ambulatory Visit: Payer: No Typology Code available for payment source | Admitting: Thoracic Surgery (Cardiothoracic Vascular Surgery)

## 2020-11-23 ENCOUNTER — Ambulatory Visit (INDEPENDENT_AMBULATORY_CARE_PROVIDER_SITE_OTHER): Payer: Self-pay | Admitting: Thoracic Surgery (Cardiothoracic Vascular Surgery)

## 2020-11-23 ENCOUNTER — Encounter: Payer: Self-pay | Admitting: Thoracic Surgery (Cardiothoracic Vascular Surgery)

## 2020-11-23 VITALS — BP 98/73 | HR 81 | Resp 20 | Ht 67.0 in | Wt 157.0 lb

## 2020-11-23 DIAGNOSIS — Z902 Acquired absence of lung [part of]: Secondary | ICD-10-CM

## 2020-11-23 DIAGNOSIS — C3491 Malignant neoplasm of unspecified part of right bronchus or lung: Secondary | ICD-10-CM

## 2020-11-23 DIAGNOSIS — R911 Solitary pulmonary nodule: Secondary | ICD-10-CM

## 2020-11-23 DIAGNOSIS — J95812 Postprocedural air leak: Secondary | ICD-10-CM

## 2020-11-23 MED ORDER — PREGABALIN 50 MG PO CAPS: 50.0000 mg | ORAL_CAPSULE | Freq: Two times a day (BID) | ORAL | 3 refills | Status: DC

## 2020-11-23 MED ORDER — TRAMADOL HCL 50 MG PO TABS: 50.0000 mg | ORAL_TABLET | Freq: Four times a day (QID) | ORAL | 0 refills | Status: DC | PRN

## 2020-11-23 NOTE — Progress Notes (Signed)
[address]       [place] [address]             [phone]      HPI: Barbara Knight returns for scheduled postoperative follow-up visit  Barbara Knight is a 56 year old non-smoker who was incidentally found to have a right upper lobe lung nodule.  She had a robotic assisted right upper lobectomy on 10/25/2020.  She had an air leak initially and went home with a tube.  We eventually got her tube out on 11/01/2020.  She was having some neuropathic pain and was started on Lyrica.  She started on 25 mg twice a day and then went up to 50 mg twice a day.  She also still using tramadol occasionally.  Overall she is feeling well but is frustrated that she is not making more rapid progress.  She is walking 2-1/2 to 3 miles with her dog.  She does still get winded.  She ran out of Lyrica over the weekend and has noticed more pain the last couple of days.  Past Medical History:  Diagnosis Date   Acquired dilation of ascending aorta and aortic root (HCC)    72m ascending aorta by CTA and echo 03/2020 and 477msinus of valsalva on echo   Anemia    Bradycardia 12/16/2014   Elevated LFTs    hepatitis virus was negative   Family history of aortic aneurysm    grandfather had aortic aneurysm repair and paternal uncle died of ruptured aortic aneurysm   Headache(784.0)    migraines   Hepatitis    autoimmune   Mitral valvular prolapse    posterior mitral vavlve leaflet with mild MR   Thyroid disease    Has a thyroid cyst     Current Outpatient Medications  Medication Sig Dispense Refill   INTRAROSA 6.5 MG INST Place 1 tablet vaginally daily.     Multiple Vitamins-Iron (MULTI-VITAMIN/IRON) TABS Take 1 tablet by mouth daily.     mycophenolate (MYFORTIC) 360 MG TBEC EC tablet Take 720 mg by mouth 2 (two) times daily.     progesterone (PROMETRIUM) 100 MG capsule Take 100 mg by mouth daily.     ursodiol (ACTIGALL) 500 MG tablet Take 500 mg by mouth 2 (two) times daily.  3    pregabalin (LYRICA) 50 MG capsule Take 1 capsule (50 mg total) by mouth 2 (two) times daily. 60 capsule 3   traMADol (ULTRAM) 50 MG tablet Take 1-2 tablets (50-100 mg total) by mouth every 6 (six) hours as needed. 28 tablet 0   No current facility-administered medications for this visit.    Physical Exam BP 98/73 (BP Location: Right Arm, Patient Position: Sitting)   Pulse 81   Resp 20   Ht 5' 7"  (1.702 m)   Wt 157 lb (71.2 kg)   LMP 11/11/2014 (Approximate)   SpO2 96% Comment: RA  BMI 24.5979g/m  [age]ear old woman in no acute distress Alert and oriented x3 with no focal deficits Lungs slightly diminished to right base but otherwise clear Incisions healing well  Diagnostic Tests: I personally reviewed her chest x-ray.  Shows a good postoperative appearance.  Impression: Barbara Knight a [age]ear old non-smoker who was incidentally found to have a right upper lobe lung nodule.  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  Her mother died of EGFR positive metastatic lung cancer.  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  She saw Dr. Julien Nordmann.  No adjuvant therapy was recommended.  Barbara Knight looks great.  She does not feel great.  She is having more pain since her Lyrica ran out.  I am going to refill that prescription.  I will give her 3 refills.  She also needs some more tramadol.  I put in a prescription for that as well.  She is a little frustrated that she is not more advanced than she is.  However, she is well ahead of the curve in terms of recovery.  She will continue to get better over the next couple of months.  Plan: Return in 6 weeks with PA lateral chest x-ray  [name], MD [facility] [phone]

## 2020-11-30 ENCOUNTER — Other Ambulatory Visit: Payer: Self-pay

## 2020-11-30 ENCOUNTER — Ambulatory Visit: Payer: No Typology Code available for payment source | Admitting: Genetic Counselor

## 2020-12-01 ENCOUNTER — Ambulatory Visit (HOSPITAL_COMMUNITY)
Admission: RE | Admit: 2020-12-01 | Discharge: 2020-12-01 | Disposition: A | Discharge status: Home / Self Care | Payer: PRIVATE HEALTH INSURANCE | Source: Ambulatory Visit | Attending: Internal Medicine | Admitting: Internal Medicine

## 2020-12-01 DIAGNOSIS — N200 Calculus of kidney: Secondary | ICD-10-CM | POA: Insufficient documentation

## 2020-12-01 DIAGNOSIS — M419 Scoliosis, unspecified: Secondary | ICD-10-CM | POA: Insufficient documentation

## 2020-12-01 DIAGNOSIS — M8448XA Pathological fracture, other site, initial encounter for fracture: Secondary | ICD-10-CM | POA: Diagnosis not present

## 2020-12-01 DIAGNOSIS — E041 Nontoxic single thyroid nodule: Secondary | ICD-10-CM | POA: Insufficient documentation

## 2020-12-01 DIAGNOSIS — Z902 Acquired absence of lung [part of]: Secondary | ICD-10-CM | POA: Diagnosis not present

## 2020-12-01 DIAGNOSIS — C3490 Malignant neoplasm of unspecified part of unspecified bronchus or lung: Secondary | ICD-10-CM | POA: Insufficient documentation

## 2020-12-01 DIAGNOSIS — K7460 Unspecified cirrhosis of liver: Secondary | ICD-10-CM | POA: Diagnosis not present

## 2020-12-01 LAB — GLUCOSE, CAPILLARY: Glucose-Capillary: 97 mg/dL (ref 70–99)

## 2020-12-01 MED ORDER — FLUDEOXYGLUCOSE F - 18 (FDG) INJECTION
7.9600 | Freq: Once | INTRAVENOUS | Status: AC
  Administered 2020-12-01: 7.96 via INTRAVENOUS

## 2020-12-08 NOTE — Progress Notes (Signed)
Post-test Genetic Consultation notes  Barbara Knight is here today for her post-test genetic consult. She notes no changes to her cardiac medical history since we met. We then reviewed her pedigree and she has prepared a very detailed family pedigree that includes several third and higher-degree relatives with aortic aneurysms.   I informed Barbara Knight that she does not have a pathogenic variant for the aortopathies that include vascular EDS, Loeys-Dietz syndrome and FTAAD.  I explained to her that this indicates she does not have a mutation in the major genes implicated in aortopathies. In light of the dramatic family history of aortic aneurysms and death from these conditions in her paternal second and third-degree relatives, it is likely that she did not inherit the familial condition as her father, now age 58, does not have any symptoms suggestive of a connective tissue disorder. In light of incomplete penetrance, her father would have presented clinically with an aortopathy by age 77. It thus seems likely that her father did not inherit the familial pathogenic variant for aortopathies. 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    Barbara Knight states that initially she was relieved to hear this but is very concerned due to the number of paternal relatives in her extended family with aortic aneurysms. She would like to enroll in a Research Study. I reviewed the [employer]tra[employer] website for such a trial. While there are a few open trials in [place], the trials in [place] are either terminated, completed or inappropriate for her age group.  Nevertheless, since most her cardiac imaging studies were by echocardiograms in the past, it would beof value to have a CT-A to confirm the size of her aortic dilatation. She was concerned about her children's risk of inheriting this. I explained that since her father is asymptomatic it is highly unlikely that she has inherited the AoD and hence her children will not need any  further screening. If symptoms do arise, then they should get screened. She verbalized understanding of this.   She also enquired if her autoimmune condition could contribute to the AoD. I have not yet seen a report linking the two though other autoimmune conditions such as Systemic lupus erythematous can lead to aortitis and aortic aneurysms.    Barbara Knight, Ph.D, [facility] Clinical Molecular Geneticist

## 2020-12-24 ENCOUNTER — Other Ambulatory Visit: Payer: Self-pay

## 2020-12-24 ENCOUNTER — Other Ambulatory Visit: Payer: Self-pay | Admitting: Thoracic Surgery (Cardiothoracic Vascular Surgery)

## 2020-12-24 ENCOUNTER — Encounter (HOSPITAL_COMMUNITY): Payer: Self-pay | Admitting: Emergency Medicine

## 2020-12-24 ENCOUNTER — Emergency Department (HOSPITAL_COMMUNITY): Payer: PRIVATE HEALTH INSURANCE

## 2020-12-24 ENCOUNTER — Emergency Department (HOSPITAL_COMMUNITY)
Admission: EM | Admit: 2020-12-24 | Discharge: 2020-12-25 | Disposition: A | Discharge status: Home / Self Care | Payer: PRIVATE HEALTH INSURANCE | Attending: Emergency Medicine | Admitting: Emergency Medicine

## 2020-12-24 DIAGNOSIS — Z85118 Personal history of other malignant neoplasm of bronchus and lung: Secondary | ICD-10-CM | POA: Insufficient documentation

## 2020-12-24 DIAGNOSIS — R059 Cough, unspecified: Secondary | ICD-10-CM | POA: Diagnosis not present

## 2020-12-24 DIAGNOSIS — Z20822 Contact with and (suspected) exposure to covid-19: Secondary | ICD-10-CM | POA: Diagnosis not present

## 2020-12-24 DIAGNOSIS — R0602 Shortness of breath: Secondary | ICD-10-CM

## 2020-12-24 LAB — RESP PANEL BY RT-PCR (FLU A&B, COVID) ARPGX2
Influenza A by PCR: NEGATIVE
Influenza B by PCR: NEGATIVE
SARS Coronavirus 2 by RT PCR: NEGATIVE

## 2020-12-24 LAB — CBC WITH DIFFERENTIAL/PLATELET
Abs Immature Granulocytes: 0.01 10*3/uL (ref 0.00–0.07)
Basophils Absolute: 0 10*3/uL (ref 0.0–0.1)
Basophils Relative: 1 %
Eosinophils Absolute: 0 10*3/uL (ref 0.0–0.5)
Eosinophils Relative: 0 %
HCT: 40.7 % (ref 36.0–46.0)
Hemoglobin: 13 g/dL (ref 12.0–15.0)
Immature Granulocytes: 0 %
Lymphocytes Relative: 21 %
Lymphs Abs: 1 10*3/uL (ref 0.7–4.0)
MCH: 28.4 pg (ref 26.0–34.0)
MCHC: 31.9 g/dL (ref 30.0–36.0)
MCV: 88.9 fL (ref 80.0–100.0)
Monocytes Absolute: 0.1 10*3/uL (ref 0.1–1.0)
Monocytes Relative: 3 %
Neutro Abs: 3.4 10*3/uL (ref 1.7–7.7)
Neutrophils Relative %: 75 %
Platelets: 149 10*3/uL — ABNORMAL LOW (ref 150–400)
RBC: 4.58 MIL/uL (ref 3.87–5.11)
RDW: 13.8 % (ref 11.5–15.5)
WBC: 4.5 10*3/uL (ref 4.0–10.5)
nRBC: 0 % (ref 0.0–0.2)

## 2020-12-24 LAB — LIPASE, BLOOD: Lipase: 35 U/L (ref 11–51)

## 2020-12-24 LAB — COMPREHENSIVE METABOLIC PANEL
ALT: 39 U/L (ref 0–44)
AST: 41 U/L (ref 15–41)
Albumin: 3.8 g/dL (ref 3.5–5.0)
Alkaline Phosphatase: 164 U/L — ABNORMAL HIGH (ref 38–126)
Anion gap: 7 (ref 5–15)
BUN: 20 mg/dL (ref 6–20)
CO2: 23 mmol/L (ref 22–32)
Calcium: 9.1 mg/dL (ref 8.9–10.3)
Chloride: 109 mmol/L (ref 98–111)
Creatinine, Ser: 0.81 mg/dL (ref 0.44–1.00)
GFR, Estimated: >60 mL/min (ref >60)
Glucose, Bld: 177 mg/dL — ABNORMAL HIGH (ref 70–99)
Potassium: 3.5 mmol/L (ref 3.5–5.1)
Sodium: 139 mmol/L (ref 135–145)
Total Bilirubin: 0.4 mg/dL (ref 0.3–1.2)
Total Protein: 6.7 g/dL (ref 6.5–8.1)

## 2020-12-24 LAB — PROTIME-INR
INR: 1 (ref 0.8–1.2)
Prothrombin Time: 13.5 seconds (ref 11.4–15.2)

## 2020-12-24 LAB — LACTIC ACID, PLASMA: Lactic Acid, Venous: 2 mmol/L (ref 0.5–1.9)

## 2020-12-24 MED ORDER — AEROCHAMBER PLUS FLO-VU LARGE MISC
1.0000 | Freq: Once | Status: AC
  Administered 2020-12-25: 1

## 2020-12-24 MED ORDER — ALBUTEROL SULFATE HFA 108 (90 BASE) MCG/ACT IN AERS
2.0000 | INHALATION_SPRAY | Freq: Once | RESPIRATORY_TRACT | Status: AC
  Administered 2020-12-25: 2 via RESPIRATORY_TRACT
  Filled 2020-12-24: qty 6.7

## 2020-12-24 MED ORDER — PREDNISONE 10 MG (21) PO TBPK: ORAL_TABLET | ORAL | 0 refills | Status: DC

## 2020-12-24 MED ORDER — DEXAMETHASONE SODIUM PHOSPHATE 10 MG/ML IJ SOLN: 10.0000 mg | Freq: Once | INTRAMUSCULAR | Status: DC

## 2020-12-24 MED ORDER — IPRATROPIUM-ALBUTEROL 0.5-2.5 (3) MG/3ML IN SOLN
3.0000 mL | Freq: Once | RESPIRATORY_TRACT | Status: AC
  Administered 2020-12-24: 3 mL via RESPIRATORY_TRACT
  Filled 2020-12-24: qty 3

## 2020-12-24 NOTE — Progress Notes (Unsigned)
Prednisone taper prescribed  Revonda Standard. Roxan Hockey, MD [facility] [phone]

## 2020-12-24 NOTE — ED Provider Notes (Signed)
11:05 PM Assumed care from Dr. Johnney Killian, please see their note for full history, physical and decision making until this point. In brief this is a 55 y.o. year old female who presented to the ED tonight with SOB /Emesis/Cough     Patient had a lobectomy a little bit over a month ago secondary to stage Ia adenocarcinoma.  She had a few episodes over the last month with acute onset of dyspnea associated with what sounds like mild wheezing that self resolved.  Tonight she had a similar episode but it was much more severe.  She states that she felt like she was not taking any air at all and then she started to get worried which made it worse.  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  She took a couple puffs of her daughter's inhaler at slightly improved but not significantly so so she presented here for further evaluation.  Seen by previous provider and a DuoNeb given.  By time I see the patient her lungs are clear she is much more comfortable and vital signs are reassuring.  Her x-ray is clear her work-up is negative.  Talked with her and her husband about the possibility of a PE being unlikely with normal vitals and the way has been intermittent in nature.  I do not think it is ACS.  It does sound like some type of bronchoconstriction however. She is already on prednisone. I observed her for approximately 2 hours with out recurrence. Will plan for continued outpatient workup.   Discharge instructions, including strict return precautions for new or worsening symptoms, given. Patient and/or family verbalized understanding and agreement with the plan as described.   Labs, studies and imaging reviewed by myself and considered in medical decision making if ordered. Imaging interpreted by radiology.  Labs Reviewed  CBC WITH DIFFERENTIAL/PLATELET - Abnormal; Notable for the following components:      Result Value   Platelets 149 (*)    All other components  within normal limits  RESP PANEL BY RT-PCR (FLU A&B, COVID) ARPGX2  PROTIME-INR  COMPREHENSIVE METABOLIC PANEL  LIPASE, BLOOD  LACTIC ACID, PLASMA  LACTIC ACID, PLASMA  URINALYSIS, ROUTINE W REFLEX MICROSCOPIC    DG Chest 2 View  Final Result      No follow-ups on file.    Nivea Wojdyla, Corene Cornea, MD 12/25/20 [phone]

## 2020-12-24 NOTE — ED Provider Notes (Signed)
[facility] EMERGENCY DEPARTMENT Provider Note   CSN: [id number] Arrival date & time: 12/24/20  2138     History Chief Complaint  Patient presents with   SOB /Emesis/Cough    Barbara Knight is a 55 y.o. female.  HPI Patient with history of stage I adenocarcinoma right lung with upper lobe lobectomy [id number] by Dr. Roxan Hockey.  Patient reports she has gotten increasingly short of breath over the past several days.  She is also had cough.  Patient reports symptoms got much worse over the past 2 days with severe shortness of breath tonight.  She did try an albuterol inhaler with no significant improvement.  Patient reports he has been having problems with some ongoing pain after her lobectomy.  She does not feel that pain quality or severity has changed significantly since worsening of shortness of breath and increasing cough.  No lower extremity swelling or calf pain.  No history of PE.  Patient denies body aches fevers chills.  Reports she did vomit several times tonight.  Patient reports history of autoimmune hepatitis.  No smoking or alcohol use.    Past Medical History:  Diagnosis Date   Acquired dilation of ascending aorta and aortic root ([place])    80mm ascending aorta by CTA and echo 03/2020 and 43mm sinus of valsalva on echo   Anemia    Bradycardia 12/16/2014   Elevated LFTs    hepatitis virus was negative   Family history of aortic aneurysm    grandfather had aortic aneurysm repair and paternal uncle died of ruptured aortic aneurysm   Headache(784.0)    migraines   Hepatitis    autoimmune   Mitral valvular prolapse    posterior mitral vavlve leaflet with mild MR   Thyroid disease    Has a thyroid cyst    Patient Active Problem List   Diagnosis Date Noted   Adenocarcinoma of right lung, stage 1 ([place]) 11/15/2020   S/P lobectomy of lung 10/25/2020   Lung nodule 10/18/2020   Acquired dilation of ascending aorta and aortic root (HCC)    Family history  of aortic aneurysm    Heart palpitations 12/16/2014   Bradycardia 12/16/2014   Mitral valvular prolapse     Past Surgical History:  Procedure Laterality Date   BRONCHIAL BIOPSY  10/25/2020   Procedure: BRONCHIAL BIOPSIES;  Surgeon: Garner Nash, DO;  Location: [facility];  Service: Pulmonary;;   BRONCHIAL BRUSHINGS  10/25/2020   Procedure: BRONCHIAL BRUSHINGS;  Surgeon: Garner Nash, DO;  Location: [facility];  Service: Pulmonary;;   DILATATION & CURETTAGE/HYSTEROSCOPY WITH TRUECLEAR N/A 09/13/2012   Procedure: DILATATION & CURETTAGE/HYSTEROSCOPY WITH TRUECLEAR and THERMACHOICE;  Surgeon: Cyril Mourning, MD;  Location: [facility];  Service: Gynecology;  Laterality: N/A;   DILATATION & CURRETTAGE/HYSTEROSCOPY WITH RESECTOCOPE N/A 09/13/2012   Procedure: [place];  Surgeon: Cyril Mourning, MD;  Location: [facility];  Service: Gynecology;  Laterality: N/A;   DILATION AND CURETTAGE OF UTERUS     INTERCOSTAL NERVE BLOCK Right 10/25/2020   Procedure: INTERCOSTAL NERVE BLOCK;  Surgeon: [name], MD;  Location: [facility];  Service: Thoracic;  Laterality: Right;   LIVER BIOPSY     x 2   LYMPH NODE DISSECTION Right 10/25/2020   Procedure: LYMPH NODE DISSECTION;  Surgeon: [name], MD;  Location: [facility];  Service: Thoracic;  Laterality: Right;   SUBMUCOSAL TATTOO INJECTION  10/25/2020   Procedure: SUBMUCOSAL TATTOO INJECTION;  Surgeon:  Garner Nash, DO;  Location: [facility];  Service: Pulmonary;;   UTERINE FIBROID SURGERY     VIDEO BRONCHOSCOPY WITH ENDOBRONCHIAL NAVIGATION Right 10/25/2020   Procedure: VIDEO BRONCHOSCOPY WITH ENDOBRONCHIAL NAVIGATION;  Surgeon: Garner Nash, DO;  Location: [facility];  Service: Pulmonary;  Laterality: Right;  ION + Fiducial Dye Marking     OB History   No obstetric history on file.     Family History  Problem Relation Age of Onset   Lung cancer Mother    Barrett's esophagus  Mother    Hypertension Father    Aortic aneurysm Paternal Grandfather        s/p repair   Aortic aneurysm Paternal Uncle        died of ruptured aortic aneurysm   Colon cancer Neg Hx    Colon polyps Neg Hx    Esophageal cancer Neg Hx    Rectal cancer Neg Hx    Stomach cancer Neg Hx     Social History   Tobacco Use   Smoking status: Never   Smokeless tobacco: Never  Vaping Use   Vaping Use: Never used  Substance Use Topics   Alcohol use: Not Currently    Comment: stopped 10/21   Drug use: No    Home Medications Prior to Admission medications   Medication Sig Start Date End Date Taking? Authorizing Provider  INTRAROSA 6.5 MG INST Place 1 tablet vaginally daily. 02/17/19   [provider]  Multiple Vitamins-Iron (MULTI-VITAMIN/IRON) TABS Take 1 tablet by mouth daily.    [provider]  mycophenolate (MYFORTIC) 360 MG TBEC EC tablet Take 720 mg by mouth 2 (two) times daily.    [provider]  predniSONE (STERAPRED UNI-PAK 21 TAB) 10 MG (21) TBPK tablet Take 6 tablets (60 mg total) by mouth daily for 1 day, THEN 5 tablets (50 mg total) daily for 1 day, THEN 4 tablets (40 mg total) daily for 1 day, THEN 3 tablets (30 mg total) daily for 1 day, THEN 2 tablets (20 mg total) daily for 1 day, THEN 1 tablet (10 mg total) daily for 1 day. 12/24/20 12/30/20  [name], MD  pregabalin (LYRICA) 50 MG capsule Take 1 capsule (50 mg total) by mouth 2 (two) times daily. 11/23/20   [name], MD  progesterone (PROMETRIUM) 100 MG capsule Take 100 mg by mouth daily. 10/04/20   [provider]  traMADol (ULTRAM) 50 MG tablet Take 1-2 tablets (50-100 mg total) by mouth every 6 (six) hours as needed. 11/23/20   [name], MD  ursodiol (ACTIGALL) 500 MG tablet Take 500 mg by mouth 2 (two) times daily. 12/10/14   [provider]    Allergies    Patient has no known allergies.  Review of Systems   Review of Systems 10  systems reviewed negative except as per HPI Physical Exam Updated Vital Signs LMP 11/11/2014 (Approximate)   Physical Exam Constitutional:      Comments: Alert nontoxic.  Mild to moderate increased work of breathing at rest.  Mental status clear.  HENT:     Mouth/Throat:     Mouth: Mucous membranes are moist.     Pharynx: Oropharynx is clear.  Eyes:     Extraocular Movements: Extraocular movements intact.  Neck:     Comments: 3 cm x 2 cm nodular mass to the right base of the neck.  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  Neck is otherwise supple without  stridor. Cardiovascular:     Rate and Rhythm: Normal rate and regular rhythm.  Pulmonary:     Comments: Mild to moderate increased work of breathing.  Speaking in full sentences without difficulty.  Adequate airflow to the bases.  No significant wheeze or crackle.  Frequent paroxysmal cough.  Patient has known history of right partial lobectomy.  No significantly appreciable lung sound differences to auscultation.  Chest wall has multiple linear approximately 2 and half centimeter surgical incisions in a circumferential pattern.  Healing well without erythema or cellulitic appearance.  No rashes or vesicular lesions. Abdominal:     General: There is no distension.     Palpations: Abdomen is soft.     Tenderness: There is no abdominal tenderness. There is no guarding.     Comments: Abdomen soft.  No distention.  No significant tenderness to palpation in the right upper quadrant or epigastrium.  Musculoskeletal:        General: No swelling or tenderness. Normal range of motion.     Right lower leg: No edema.     Left lower leg: No edema.     Comments: No peripheral edema.  Calves are soft and nontender.  Skin:    General: Skin is warm and dry.  Neurological:     General: No focal deficit present.     Mental Status: She is oriented to person, place, and time.     Motor: No weakness.     Coordination: Coordination normal.   Psychiatric:        Mood and Affect: Mood normal.    ED Results / Procedures / Treatments   Labs (all labs ordered are listed, but only abnormal results are displayed) Labs Reviewed - No data to display  EKG None  Radiology No results found.  Procedures Procedures   Medications Ordered in ED Medications - No data to display  ED Course  I have reviewed the triage vital signs and the nursing notes.  Pertinent labs & imaging results that were available during my care of the patient were reviewed by me and considered in my medical decision making (see chart for details).    MDM Rules/Calculators/A&P                           Patient presents with history of partial lobectomy approximately 2 months ago.  Patient grade 1 adenocarcinoma with no subsequent radiation therapy or chemotherapy.  She has had increasing cough and shortness of breath with significant shortness of breath at night.  At this time we will proceed with chest x-ray, lab work, EKG.  Will obtain COVID influenza swabs.  Will trial DuoNeb for response to treatment.  Will reassess for change in pulmonary auscultation.  Will consider PE study.  Dr. Dayna Barker to follow-up on diagnostic studies and complete evaluation for disposition and treatment plan. Final Clinical Impression(s) / ED Diagnoses Final diagnoses:  SOB (shortness of breath)    Rx / DC Orders ED Discharge Orders     None        Charlesetta Shanks, MD 12/25/20 1512

## 2020-12-24 NOTE — ED Triage Notes (Signed)
Patient reports worsening SOB , chest congestion with cough/stridor onset this week .

## 2020-12-25 MED ORDER — AEROCHAMBER PLUS FLO-VU LARGE MISC
Status: AC
  Filled 2020-12-25: qty 1

## 2020-12-25 NOTE — Discharge Instructions (Signed)
I will send a message to Drs. Icard and Hendrickson regarding your visit today. If they feel the need to follow up for more testing, I will leave it at their discretion. Please return here for any worsening or change in status.

## 2020-12-27 ENCOUNTER — Encounter: Payer: Self-pay | Admitting: Pulmonary Disease

## 2020-12-28 MED ORDER — BUDESONIDE-FORMOTEROL FUMARATE 160-4.5 MCG/ACT IN AERO: 2.0000 | INHALATION_SPRAY | Freq: Two times a day (BID) | RESPIRATORY_TRACT | 6 refills | Status: DC

## 2020-12-28 MED ORDER — PREDNISONE 10 MG PO TABS
ORAL_TABLET | ORAL | 0 refills | Status: AC
Start: 2020-12-28 — End: 2021-01-13

## 2020-12-28 NOTE — Telephone Encounter (Signed)
Dr. Valeta Harms, please see mychart message from pt and advise: Barbara Knight Penn "Barbara Knight"  P [facility] (supporting Icard, Bradley L, DO) 12 hours ago (9:35 PM)   I hope you are doing well and want to again thank you.   Checking in about chronic cough which has worsened last 2 1/2 weeks and especially last week. Had been dry cough then more clear thick saliva/ mucous. Very episodic and have periods when short of breath and difficult to stop coughing. Not only isolated to cold air or activity or anything I can pinpoint as consistent trigger. No cold symptoms or fever. Last week called Richardson Landry Hendrickson's office and short steroid taper called in for post op bronchial inflammation. I took 60 mg prednisone at 6 pm Friday 12/2. Around 9 pm had sudden severe bronchospasm, used my daughter's albuterol inhaler and went to ER where albuterol neb helped. ER MD suggested I contact you as well. CXR and labs were ok.  I am a little better but today day 4/6 day steroid taper.  Still needing  albuterol inhaler several times a day. Trying cough drops for throat irritation. Wondering If I need longer steroid course or you have other suggestions.  Thank you. Barbara Knight

## 2020-12-29 ENCOUNTER — Other Ambulatory Visit (HOSPITAL_COMMUNITY): Payer: Self-pay

## 2021-01-03 ENCOUNTER — Other Ambulatory Visit: Payer: Self-pay | Admitting: Thoracic Surgery (Cardiothoracic Vascular Surgery)

## 2021-01-03 DIAGNOSIS — C3491 Malignant neoplasm of unspecified part of right bronchus or lung: Secondary | ICD-10-CM

## 2021-01-04 ENCOUNTER — Ambulatory Visit
Admission: RE | Admit: 2021-01-04 | Discharge: 2021-01-04 | Disposition: A | Discharge status: Home / Self Care | Payer: No Typology Code available for payment source | Source: Ambulatory Visit | Attending: Thoracic Surgery (Cardiothoracic Vascular Surgery) | Admitting: Thoracic Surgery (Cardiothoracic Vascular Surgery)

## 2021-01-04 ENCOUNTER — Ambulatory Visit (INDEPENDENT_AMBULATORY_CARE_PROVIDER_SITE_OTHER): Payer: Self-pay | Admitting: Thoracic Surgery (Cardiothoracic Vascular Surgery)

## 2021-01-04 ENCOUNTER — Other Ambulatory Visit: Payer: Self-pay

## 2021-01-04 ENCOUNTER — Encounter: Payer: Self-pay | Admitting: Thoracic Surgery (Cardiothoracic Vascular Surgery)

## 2021-01-04 VITALS — BP 143/83 | HR 77 | Resp 20 | Ht 67.0 in | Wt 158.0 lb

## 2021-01-04 DIAGNOSIS — Z902 Acquired absence of lung [part of]: Secondary | ICD-10-CM

## 2021-01-04 DIAGNOSIS — C3491 Malignant neoplasm of unspecified part of right bronchus or lung: Secondary | ICD-10-CM

## 2021-01-04 MED ORDER — AZITHROMYCIN 250 MG PO TABS: ORAL_TABLET | ORAL | 0 refills | Status: DC

## 2021-01-04 NOTE — Progress Notes (Signed)
[address]       [name] [address]             [phone]     HPI: Barbara Knight returns for scheduled postoperative follow-up visit following right upper lobectomy.  Barbara Knight is a 55 year old non-smoker had a right upper lobectomy on 10/25/2020.  She had an air leak postoperatively and went home with a tube agreeable to get it out on postoperative day #7.  Pathology showed a stage Ia adenocarcinoma.  Of note she did have noncaseating granulomas in her lymph nodes.  She has had some issues with neuropathic pain and she is currently on Lyrica 50 mg twice daily.  She also had a rib fracture noted on a PET/CT which may be causing some of her pain issues.  She was taking Aleve but stopped that due to it gastrointestinal upset.  She rarely takes a tramadol.  She was in the emergency room about a week ago with wheezing and shortness of breath.  She responded to a nebulizer.  She had been given prednisone taper prior to that.  She currently is on a more prolonged taper prescribed by Dr. Valeta Harms.  She is feeling better but her respiratory status is not back to normal.   Past Medical History:  Diagnosis Date   Acquired dilation of ascending aorta and aortic root (HCC)    80mm ascending aorta by CTA and echo 03/2020 and 29mm sinus of valsalva on echo   Anemia    Bradycardia 12/16/2014   Elevated LFTs    hepatitis virus was negative   Family history of aortic aneurysm    grandfather had aortic aneurysm repair and paternal uncle died of ruptured aortic aneurysm   Headache(784.0)    migraines   Hepatitis    autoimmune   Mitral valvular prolapse    posterior mitral vavlve leaflet with mild MR   Thyroid disease    Has a thyroid cyst    Current Outpatient Medications  Medication Sig Dispense Refill   azithromycin (ZITHROMAX Z-PAK) 250 MG tablet Take 2 tablets PO today, then 1 daily for 4 days 6 each 0   budesonide-formoterol (SYMBICORT) 160-4.5 MCG/ACT inhaler  Inhale 2 puffs into the lungs in the morning and at bedtime. 10.2 g 6   INTRAROSA 6.5 MG INST Place 1 tablet vaginally daily.     Multiple Vitamins-Iron (MULTI-VITAMIN/IRON) TABS Take 1 tablet by mouth daily.     mycophenolate (MYFORTIC) 360 MG TBEC EC tablet Take 720 mg by mouth 2 (two) times daily.     predniSONE (DELTASONE) 10 MG tablet Take 4 tablets (40 mg total) by mouth daily with breakfast for 4 days, THEN 3 tablets (30 mg total) daily with breakfast for 4 days, THEN 2 tablets (20 mg total) daily with breakfast for 4 days, THEN 1 tablet (10 mg total) daily with breakfast for 4 days. 40 tablet 0   pregabalin (LYRICA) 50 MG capsule Take 1 capsule (50 mg total) by mouth 2 (two) times daily. 60 capsule 3   progesterone (PROMETRIUM) 100 MG capsule Take 100 mg by mouth daily.     traMADol (ULTRAM) 50 MG tablet Take 1-2 tablets (50-100 mg total) by mouth every 6 (six) hours as needed. 28 tablet 0   ursodiol (ACTIGALL) 500 MG tablet Take 500 mg by mouth 2 (two) times daily.  3   No current facility-administered medications for this visit.    Physical Exam BP (!) 143/83 (BP  Location: Right Arm, Patient Position: Sitting, Cuff Size: Normal)    Pulse 77    Resp 20    Ht 5\' 7"  (1.702 m)    Wt 158 lb (71.7 kg)    LMP 11/11/2014 (Approximate)    SpO2 97% Comment: RA   BMI 24.33 kg/m  55 year old woman in no acute distress Well-developed and well-nourished Alert and oriented x3 with no focal deficits Lungs diminished at right base, but otherwise clear Incisions healing well  Diagnostic Tests: CHEST - 2 VIEW   COMPARISON:  Radiograph 12/24/2020, chest CT 10/08/2020   FINDINGS: Unchanged cardiomediastinal silhouette. Postsurgical changes of right upper lobectomy, faint opacity along the right upper lung, new from recent exam. No large pleural effusion. No pneumothorax.   IMPRESSION: Postsurgical changes of right upper lobectomy. Faint opacity in the right upper lung, new since recent exam on  12/02. Could present scar or developing infection. Recommend radiographic follow-up.     Electronically Signed   By: Barbara Knight M.D.   On: 01/04/2021 11:21 I personally reviewed the chest x-ray images.  There is some opacification near the staple line in the right upper lung.  Was not present on her chest x-ray from 12/24/2020.  Could be infectious or inflammatory.  Impression: Barbara Knight is a 55 year old woman who had a right upper lobectomy for a stage Ia adenocarcinoma about 2 months ago.  She is a lifelong non-smoker.  She continues to have some issues with postoperative pain and paresthesias as well as some respiratory issues.  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  She did have a rib fracture on PET/CT which is contributing to her pain.  This is gradually improving.  I recommended that she stay on Lyrica for another month.  At that point she can try decreasing to 25 mg twice daily for a week and then if no changes she could drop down to 25 mg a day for another week.  She was in the emergency room about a week ago with some shortness of breath and bronchospasm.  They responded to nebulizers.  She is currently on a prednisone taper.  Lung concerned that will sometimes occur after right upper lobectomy is kinking of the bronchi.  Looking at her PET/CT which was done after her surgery the lower and middle lobe airways are patent.  There could be some angulation which can contribute to more severe symptoms with congestion that she might have otherwise.  She did have nonnecrotizing granulomas in her lymph nodes on pathology.  It is possible some of her symptoms could be related to sarcoidosis that has flared in response to the stress of the surgery.  Her chest x-ray shows some faint opacity near one of the suture lines.  Is possible she has some inflammation or scarring in that area but it also could be an infection.  We discussed our options and decided to treat her with  azithromycin for possible bronchitis.  She will continue her prednisone taper as prescribed by Dr. Valeta Harms  Plan: Agree with steroid taper Will treat empirically for possible bronchitis with azithromycin Bronchodilator as needed Return in 2 months with PA and lateral chest x-ray  [name], MD [facility] [phone]

## 2021-01-07 MED ORDER — PREDNISONE 20 MG PO TABS: 40.0000 mg | ORAL_TABLET | Freq: Every day | ORAL | 0 refills | Status: DC

## 2021-01-07 NOTE — Telephone Encounter (Signed)
FYI for BI  Richardson Landry Hendrikson also looked at PET scan and questioned if there is some narrowing of the RML bronchus with the lung position post lobectomy that might contribute to some of my symptoms.

## 2021-01-07 NOTE — Telephone Encounter (Signed)
BI please advise. Thanks  

## 2021-01-07 NOTE — Addendum Note (Signed)
Addended by: Elie Confer on: 01/07/2021 08:53 AM   Modules accepted: Orders

## 2021-01-10 NOTE — Telephone Encounter (Signed)
Do you know how to send in the paxlovid prescription? Please advise.

## 2021-01-10 NOTE — Telephone Encounter (Signed)
Paxlovid 300/100 mg Twice daily for 5 days. You might have to search the database instead of preferred list.

## 2021-01-11 ENCOUNTER — Telehealth: Payer: Self-pay | Admitting: Pulmonary Disease

## 2021-01-11 MED ORDER — PAXLOVID (300/100) 20 X 150 MG & 10 X 100MG PO TBPK: 1.0000 | ORAL_TABLET | Freq: Two times a day (BID) | ORAL | 0 refills | Status: DC

## 2021-01-11 NOTE — Addendum Note (Signed)
Addended by: Elie Confer on: 01/11/2021 09:18 AM   Modules accepted: Orders

## 2021-01-11 NOTE — Telephone Encounter (Signed)
Call returned to pharmacy, confirmed DOB. Patient was prescribed Paxlovid but pharmacist received a message stating paxlovid was not to be given with prednisone and symbicort. Requesting recommendations from provider. Per pharmacist patient should only have 1 day of prednisone left but wanted to be sure it was ok for patient to take both the prednisone and symbicort.   LMTCB with patient.   BI please advise. Thanks

## 2021-01-11 NOTE — Telephone Encounter (Signed)
January 11, 2021 Garner Nash, DO to Vivia Ewing, LPN      1:77 PM Im happy to speak with pharmacist if needed   Garner Nash, DO  [facility]  01/11/2021 3:34 PM   Spoke with the pharmacist and notified of response per Dr Valeta Harms. She verbalized understanding and states nothing further needed

## 2021-02-03 ENCOUNTER — Other Ambulatory Visit: Payer: Self-pay

## 2021-02-03 ENCOUNTER — Ambulatory Visit: Payer: No Typology Code available for payment source | Admitting: Pulmonary Disease

## 2021-02-03 ENCOUNTER — Encounter: Payer: Self-pay | Admitting: Pulmonary Disease

## 2021-02-03 VITALS — BP 110/80 | HR 61 | Temp 97.7°F | Ht 67.0 in | Wt 168.2 lb

## 2021-02-03 DIAGNOSIS — D849 Immunodeficiency, unspecified: Secondary | ICD-10-CM | POA: Diagnosis not present

## 2021-02-03 DIAGNOSIS — C3491 Malignant neoplasm of unspecified part of right bronchus or lung: Secondary | ICD-10-CM

## 2021-02-03 DIAGNOSIS — Z902 Acquired absence of lung [part of]: Secondary | ICD-10-CM | POA: Diagnosis not present

## 2021-02-03 DIAGNOSIS — K7689 Other specified diseases of liver: Secondary | ICD-10-CM

## 2021-02-03 DIAGNOSIS — D71 Functional disorders of polymorphonuclear neutrophils: Secondary | ICD-10-CM

## 2021-02-03 DIAGNOSIS — C3490 Malignant neoplasm of unspecified part of unspecified bronchus or lung: Secondary | ICD-10-CM | POA: Diagnosis not present

## 2021-02-03 DIAGNOSIS — R911 Solitary pulmonary nodule: Secondary | ICD-10-CM

## 2021-02-03 DIAGNOSIS — D719 Functional disorders of polymorphonuclear neutrophils, unspecified: Secondary | ICD-10-CM

## 2021-02-03 NOTE — Patient Instructions (Addendum)
Thank you for visiting Dr. Valeta Harms at [facility]. Today we recommend the following:  Orders Placed This Encounter  Procedures   DG Chest 2 View   CT CHEST HIGH RESOLUTION   PFTS prior to next office visit  Return in about 4 months (around 06/03/2021).    Please do your part to reduce the spread of COVID-19.

## 2021-02-03 NOTE — Progress Notes (Signed)
Synopsis: Referred in September 2022 for lung nodule, Dr. Chase Caller, PCP: By [name], MD  Subjective:   PATIENT ID: Barbara Knight GENDER: female DOB: April 22, 1965, MRN: [id number]  Chief Complaint  Patient presents with   Follow-up    Patient says shortness of breath is improving.     This is a 56 year old female, past medical history of autoimmune hepatitis currently on immunosuppression with mycophenolate. He She follows with Dr. Chase Caller in pulmonary clinic was diagnosed with primary biliary cirrhosis followed at [facility].  She had COVID in January 2022.  She had a small groundglass opacity found on CT imaging her mother was diagnosed with metastatic adenocarcinoma.  She had an initial CT scan of the chest in June 2022 which revealed a subsolid nodular lesion within the apex of the right upper lobe.  We made a decision to follow this up short-term.  In September 2022.  This CT scan was completed on 10/08/2020 which showed interval enlargement of the subsolid lesion now measuring 14 x 9 mm.  Patient was referred today to discuss options regarding lung nodule and next steps.  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 Roxan Hockey.  Patient's postoperative course has been complicated by neuropathic pain treated with Lyrica.  Also having some ongoing shortness of breath.  Molecular testing of the tumor was EGFR negative but ALK positive.  Mother history with EGFR positive metastatic lung cancer.  She was seen in the emergency department in December for shortness of breath treated with a course of steroids and new inhaler regimen.  Last seen in the office by thoracic surgery on 01/04/2021 office note reviewed.  Treated with a course of antibiotics and bronchodilators.  Here today for follow-up.      Past Medical History:  Diagnosis Date    Acquired dilation of ascending aorta and aortic root (HCC)    25m ascending aorta by CTA and echo 03/2020 and 429msinus of valsalva on echo   Anemia    Bradycardia 12/16/2014   Elevated LFTs    hepatitis virus was negative   Family history of aortic aneurysm    grandfather had aortic aneurysm repair and paternal uncle died of ruptured aortic aneurysm   Headache(784.0)    migraines   Hepatitis    autoimmune   Mitral valvular prolapse    posterior mitral vavlve leaflet with mild MR   Thyroid disease    Has a thyroid cyst     Family History  Problem Relation Age of Onset   Lung cancer Mother    Barrett's esophagus Mother    Hypertension Father    Aortic aneurysm Paternal Grandfather        s/p repair   Aortic aneurysm Paternal Uncle        died of ruptured aortic aneurysm   Colon cancer Neg Hx    Colon polyps Neg Hx    Esophageal cancer Neg Hx    Rectal cancer Neg Hx    Stomach cancer Neg Hx      Past Surgical History:  Procedure Laterality Date   BRONCHIAL BIOPSY  10/25/2020   Procedure: BRONCHIAL BIOPSIES;  Surgeon: IcGarner NashDO;  [facility];  Service: Pulmonary;;   BRONCHIAL BRUSHINGS  10/25/2020   Procedure: BRONCHIAL BRUSHINGS;  Surgeon: IcGarner NashDO;  [facility];  Service: Pulmonary;;   DILATATION & CURETTAGE/HYSTEROSCOPY  WITH TRUECLEAR N/A 09/13/2012   Procedure: DILATATION & CURETTAGE/HYSTEROSCOPY WITH TRUECLEAR and THERMACHOICE;  Surgeon: Cyril Mourning, MD;  Location: [facility];  Service: Gynecology;  Laterality: N/A;   DILATATION & CURRETTAGE/HYSTEROSCOPY WITH RESECTOCOPE N/A 09/13/2012   Procedure: [place];  Surgeon: Cyril Mourning, MD;  Location: [facility];  Service: Gynecology;  Laterality: N/A;   DILATION AND CURETTAGE OF UTERUS     INTERCOSTAL NERVE BLOCK Right 10/25/2020   Procedure: INTERCOSTAL NERVE BLOCK;  Surgeon: [name], MD;  Location: [facility];  Service:  Thoracic;  Laterality: Right;   LIVER BIOPSY     x 2   LYMPH NODE DISSECTION Right 10/25/2020   Procedure: LYMPH NODE DISSECTION;  Surgeon: [name], MD;  Location: [place];  Service: Thoracic;  Laterality: Right;   SUBMUCOSAL TATTOO INJECTION  10/25/2020   Procedure: SUBMUCOSAL TATTOO INJECTION;  Surgeon: Garner Nash, DO;  Location: [place] [facility];  Service: Pulmonary;;   UTERINE FIBROID SURGERY     VIDEO BRONCHOSCOPY WITH ENDOBRONCHIAL NAVIGATION Right 10/25/2020   Procedure: VIDEO BRONCHOSCOPY WITH ENDOBRONCHIAL NAVIGATION;  Surgeon: Garner Nash, DO;  Location: [place];  Service: Pulmonary;  Laterality: Right;  ION + Fiducial Dye Marking    Social History   Socioeconomic History   Marital status: Married    Spouse name: Not on file   Number of children: Not on file   Years of education: Not on file   Highest education level: Not on file  Occupational History   Not on file  Tobacco Use   Smoking status: Never   Smokeless tobacco: Never  Vaping Use   Vaping Use: Never used  Substance and Sexual Activity   Alcohol use: Not Currently    Comment: stopped 10/21   Drug use: No   Sexual activity: Yes  Other Topics Concern   Not on file  Social History Narrative   Not on file   Social Determinants of Health   Financial Resource Strain: Not on file  Food Insecurity: Not on file  Transportation Needs: Not on file  Physical Activity: Not on file  Stress: Not on file  Social Connections: Not on file  Intimate Partner Violence: Not on file     No Known Allergies   Outpatient Medications Prior to Visit  Medication Sig Dispense Refill   azithromycin (ZITHROMAX Z-PAK) 250 MG tablet Take 2 tablets PO today, then 1 daily for 4 days 6 each 0   budesonide-formoterol (SYMBICORT) 160-4.5 MCG/ACT inhaler Inhale 2 puffs into the lungs in the morning and at bedtime. 10.2 g 6   INTRAROSA 6.5 MG INST Place 1 tablet vaginally daily.     Multiple Vitamins-Iron  (MULTI-VITAMIN/IRON) TABS Take 1 tablet by mouth daily.     mycophenolate (MYFORTIC) 360 MG TBEC EC tablet Take 720 mg by mouth 2 (two) times daily.     nirmatrelvir & ritonavir (PAXLOVID, 300/100,) 20 x 150 MG & 10 x 100MG TBPK Take 1 tablet by mouth 2 (two) times daily. 10 tablet 0   predniSONE (DELTASONE) 20 MG tablet Take 2 tablets (40 mg total) by mouth daily with breakfast. 20 tablet 0   pregabalin (LYRICA) 50 MG capsule Take 1 capsule (50 mg total) by mouth 2 (two) times daily. 60 capsule 3   progesterone (PROMETRIUM) 100 MG capsule Take 100 mg by mouth daily.     traMADol (ULTRAM) 50 MG tablet Take 1-2 tablets (50-100 mg total) by mouth every 6 (six) hours  as needed. 28 tablet 0   ursodiol (ACTIGALL) 500 MG tablet Take 500 mg by mouth 2 (two) times daily.  3   No facility-administered medications prior to visit.    Review of Systems  Constitutional:  Negative for chills, fever, malaise/fatigue and weight loss.  HENT:  Negative for hearing loss, sore throat and tinnitus.   Eyes:  Negative for blurred vision and double vision.  Respiratory:  Positive for cough and shortness of breath. Negative for hemoptysis, sputum production, wheezing and stridor.   Cardiovascular:  Negative for chest pain, palpitations, orthopnea, leg swelling and PND.  Gastrointestinal:  Negative for abdominal pain, constipation, diarrhea, heartburn, nausea and vomiting.  Genitourinary:  Negative for dysuria, hematuria and urgency.  Musculoskeletal:  Negative for joint pain and myalgias.  Skin:  Negative for itching and rash.  Neurological:  Negative for dizziness, tingling, weakness and headaches.  Endo/Heme/Allergies:  Negative for environmental allergies. Does not bruise/bleed easily.  Psychiatric/Behavioral:  Negative for depression. The patient is not nervous/anxious and does not have insomnia.   All other systems reviewed and are negative.   Objective:  Physical Exam Vitals reviewed.  Constitutional:       General: She is not in acute distress.    Appearance: She is well-developed.  HENT:     Head: Normocephalic and atraumatic.  Eyes:     General: No scleral icterus.    Conjunctiva/sclera: Conjunctivae normal.     Pupils: Pupils are equal, round, and reactive to light.  Neck:     Vascular: No JVD.     Trachea: No tracheal deviation.  Cardiovascular:     Rate and Rhythm: Normal rate and regular rhythm.     Heart sounds: Normal heart sounds. No murmur heard. Pulmonary:     Effort: Pulmonary effort is normal. No tachypnea, accessory muscle usage or respiratory distress.     Breath sounds: No stridor. No wheezing, rhonchi or rales.  Abdominal:     General: Bowel sounds are normal. There is no distension.     Palpations: Abdomen is soft.     Tenderness: There is no abdominal tenderness.  Musculoskeletal:        General: No tenderness.     Cervical back: Neck supple.  Lymphadenopathy:     Cervical: No cervical adenopathy.  Skin:    General: Skin is warm and dry.     Capillary Refill: Capillary refill takes less than 2 seconds.     Findings: No rash.  Neurological:     Mental Status: She is alert and oriented to person, place, and time.  Psychiatric:        Behavior: Behavior normal.     Vitals:   02/03/21 1034  BP: 110/80  Pulse: 61  Temp: 97.7 F (36.5 C)  TempSrc: Oral  SpO2: 99%  Weight: 168 lb 3.2 oz (76.3 kg)  Height: 5' 7"  (1.702 m)   99% on RA BMI Readings from Last 3 Encounters:  02/03/21 26.34 kg/m  01/04/21 24.75 kg/m  11/23/20 24.59 kg/m   Wt Readings from Last 3 Encounters:  02/03/21 168 lb 3.2 oz (76.3 kg)  01/04/21 158 lb (71.7 kg)  11/23/20 157 lb (71.2 kg)     CBC    Component Value Date/Time   WBC 4.5 12/24/2020 2233   RBC 4.58 12/24/2020 2233   HGB 13.0 12/24/2020 2233   HGB 12.7 11/15/2020 1349   HGB 13.5 04/07/2020 1542   HCT 40.7 12/24/2020 2233   HCT 40.4 04/07/2020 1542  PLT 149 (L) 12/24/2020 2233   PLT 176 11/15/2020  1349   PLT 173 04/07/2020 1542   MCV 88.9 12/24/2020 2233   MCV 89 04/07/2020 1542   MCH 28.4 12/24/2020 2233   MCHC 31.9 12/24/2020 2233   RDW 13.8 12/24/2020 2233   RDW 13.3 04/07/2020 1542   LYMPHSABS 1.0 12/24/2020 2233   MONOABS 0.1 12/24/2020 2233   EOSABS 0.0 12/24/2020 2233   BASOSABS 0.0 12/24/2020 2233     Chest Imaging:  10/08/2020 CT chest: Patient had a follow-up CT imaging from her June images which are about reveals a slight enlargement in the subsolid component right upper lobe nodule. We reviewed these images today in the office. The patient's images have been independently reviewed by me.    Pulmonary Functions Testing Results: PFT Results Latest Ref Rng & Units 10/18/2020  FVC-Pre L 4.08  FVC-Predicted Pre % 107  FVC-Post L 3.95  FVC-Predicted Post % 104  Pre FEV1/FVC % % 81  Post FEV1/FCV % % 83  FEV1-Pre L 3.30  FEV1-Predicted Pre % 111  FEV1-Post L 3.27  DLCO uncorrected ml/min/mmHg 20.65  DLCO UNC% % 90  DLVA Predicted % 88  TLC L 6.08  TLC % Predicted % 110  RV % Predicted % 92    FeNO:   Pathology:   Echocardiogram:   Heart Catheterization:     Assessment & Plan:     ICD-10-CM   1. Malignant neoplasm of unspecified part of unspecified bronchus or lung (HCC)  C34.90 DG Chest 2 View    CT CHEST HIGH RESOLUTION    2. Lung nodule  R91.1 Pulmonary Function Test    CANCELED: Pulmonary Function Test    3. 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  Surgery completed by Dr. Roxan Hockey.  Of note medical history includes autoimmune hepatitis/primary biliary cirrhosis immunosuppressed  on mycophenolate.  Postoperative diagnoses also included lymph nodes with noncaseating granulomas.  Plan: We talked about her path report and the fact that we could have subsequently uncovered a diagnosis of sarcoidosis however she does not have any parenchymal changes in the lung that are suggestive of this.  This however could have been covered up by her chronic immune suppression on mycophenolate for her liver disease. No matter the fact I think staying on immune suppression will treat both liver disease and sarcoidosis. She did well after a short flareup on prednisone postoperatively.  She is slowly been recovering. Encouraged her to continue to exercise and recuperate to help build her endurance post surgery. Continuing inhaler use at this time. She did have a chest x-ray with the right upper lobe infiltrate back in December. She needs a repeat chest x-ray in approximately 6 weeks after this to ensure clearance she was treated with antibiotics. Chest x-ray in February I will call her with the results. As for her repeat CT imaging would be great if we can get HRCT imaging done that are prone and supine to ensure no changes in her lung parenchyma that would be suggestive of sarcoidosis she does have follow-up imaging in April planned  for surveillance of the malignancy by Dr. Earlie Server. Patient to follow-up with [place] in May after her CT imaging complete.  In approximately 4 months.    Current Outpatient Medications:    azithromycin (ZITHROMAX Z-PAK) 250 MG tablet, Take 2 tablets PO today, then 1 daily for 4 days, Disp: 6 each, Rfl: 0   budesonide-formoterol (SYMBICORT) 160-4.5 MCG/ACT inhaler, Inhale 2 puffs into the lungs in the morning and at bedtime., Disp: 10.2 g, Rfl: 6   INTRAROSA 6.5 MG INST, Place 1 tablet vaginally daily., Disp: , Rfl:    Multiple Vitamins-Iron (MULTI-VITAMIN/IRON) TABS, Take 1 tablet by mouth daily., Disp: , Rfl:    mycophenolate (MYFORTIC) 360 MG TBEC EC tablet, Take  720 mg by mouth 2 (two) times daily., Disp: , Rfl:    nirmatrelvir & ritonavir (PAXLOVID, 300/100,) 20 x 150 MG & 10 x 100MG TBPK, Take 1 tablet by mouth 2 (two) times daily., Disp: 10 tablet, Rfl: 0   predniSONE (DELTASONE) 20 MG tablet, Take 2 tablets (40 mg total) by mouth daily with breakfast., Disp: 20 tablet, Rfl: 0   pregabalin (LYRICA) 50 MG capsule, Take 1 capsule (50 mg total) by mouth 2 (two) times daily., Disp: 60 capsule, Rfl: 3   progesterone (PROMETRIUM) 100 MG capsule, Take 100 mg by mouth daily., Disp: , Rfl:    traMADol (ULTRAM) 50 MG tablet, Take 1-2 tablets (50-100 mg total) by mouth every 6 (six) hours as needed., Disp: 28 tablet, Rfl: 0   ursodiol (ACTIGALL) 500 MG tablet, Take 500 mg by mouth 2 (two) times daily., Disp: , Rfl: 3  I spent 42 minutes dedicated to the care of this patient on the date of this encounter to include pre-visit review of records, face-to-face time with the patient discussing conditions above, post visit ordering of testing, clinical documentation with the electronic health record, making appropriate referrals as documented, and communicating necessary findings to members of the patients care team.    Garner Nash, DO [facility] 02/03/2021 11:27 AM

## 2021-03-07 ENCOUNTER — Other Ambulatory Visit: Payer: Self-pay | Admitting: Thoracic Surgery (Cardiothoracic Vascular Surgery)

## 2021-03-07 DIAGNOSIS — R911 Solitary pulmonary nodule: Secondary | ICD-10-CM

## 2021-03-08 ENCOUNTER — Encounter: Payer: Self-pay | Admitting: Thoracic Surgery (Cardiothoracic Vascular Surgery)

## 2021-03-08 ENCOUNTER — Other Ambulatory Visit: Payer: Self-pay

## 2021-03-08 ENCOUNTER — Ambulatory Visit: Payer: No Typology Code available for payment source | Admitting: Thoracic Surgery (Cardiothoracic Vascular Surgery)

## 2021-03-08 ENCOUNTER — Ambulatory Visit
Admission: RE | Admit: 2021-03-08 | Discharge: 2021-03-08 | Disposition: A | Discharge status: Home / Self Care | Payer: PRIVATE HEALTH INSURANCE | Source: Ambulatory Visit | Attending: Thoracic Surgery (Cardiothoracic Vascular Surgery) | Admitting: Thoracic Surgery (Cardiothoracic Vascular Surgery)

## 2021-03-08 VITALS — BP 123/81 | HR 75 | Resp 20 | Ht 67.0 in | Wt 168.0 lb

## 2021-03-08 DIAGNOSIS — Z902 Acquired absence of lung [part of]: Secondary | ICD-10-CM | POA: Diagnosis not present

## 2021-03-08 DIAGNOSIS — R911 Solitary pulmonary nodule: Secondary | ICD-10-CM

## 2021-03-08 NOTE — Progress Notes (Signed)
[address]       [place] [address]             [phone]     HPI: Barbara Knight returns for a scheduled follow-up visit after recent right upper lobectomy.  Barbara Knight is a 56 year old non-smoker.  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  Her mother had an EGFR positive adenocarcinoma of the lung.  She had a right upper lobectomy in September for a stage Ia adenocarcinoma of the lung.  Her tumor was ALK positive.  She had a prolonged air leak postop and went home with a chest tube in place.  She has had some issues with wheezing and shortness of breath since surgery.  She also had a lot of pain initially.  Her pain has improved dramatically but has not completely resolved.  She is no longer taking anything for pain.  She does notice some soreness when she lies on that side and occasionally will have a pins-and-needles type sensation.  Her biggest complaint currently is that she is not able to exercise the way she was able to preoperatively.  She gets short of breath before she can "break a sweat."  Past Medical History:  Diagnosis Date   Acquired dilation of ascending aorta and aortic root (HCC)    10m ascending aorta by CTA and echo 03/2020 and 443msinus of valsalva on echo   Anemia    Bradycardia 12/16/2014   Elevated LFTs    hepatitis virus was negative   Family history of aortic aneurysm    grandfather had aortic aneurysm repair and paternal uncle died of ruptured aortic aneurysm   Headache(784.0)    migraines   Hepatitis    autoimmune   Mitral valvular prolapse    posterior mitral vavlve leaflet with mild MR   Thyroid disease    Has a thyroid cyst    Current Outpatient Medications  Medication Sig Dispense Refill   azithromycin (ZITHROMAX Z-PAK) 250 MG tablet Take 2 tablets PO today, then 1 daily for 4 days 6 each 0    budesonide-formoterol (SYMBICORT) 160-4.5 MCG/ACT inhaler Inhale 2 puffs into the lungs in the morning and at bedtime. 10.2 g 6   INTRAROSA 6.5 MG INST Place 1 tablet vaginally daily.     Multiple Vitamins-Iron (MULTI-VITAMIN/IRON) TABS Take 1 tablet by mouth daily.     mycophenolate (MYFORTIC) 360 MG TBEC EC tablet Take 720 mg by mouth 2 (two) times daily.     progesterone (PROMETRIUM) 100 MG capsule Take 100 mg by mouth daily.     traMADol (ULTRAM) 50 MG tablet Take 1-2 tablets (50-100 mg total) by mouth every 6 (six) hours as needed. 28 tablet 0   ursodiol (ACTIGALL) 500 MG tablet Take 500 mg by mouth 2 (two) times daily.  3   No current facility-administered medications for this visit.    Physical Exam BP 123/81 (BP Location: Left Arm, Patient Position: Sitting, Cuff Size: Normal)    Pulse 75    Resp 20    Ht 5' 7"  (1.702 m)    Wt 168 lb (76.2 kg)    LMP 11/11/2014 (Approximate)    SpO2 97% Comment: RA   BMI 26.3164g/m  [age] old woman in no acute distress Alert and oriented x3 with no focal deficits Lungs slightly diminished at right base but otherwise clear with  no rales or wheezing Cardiac regular rate and rhythm  Diagnostic Tests: CHEST - 2 VIEW   COMPARISON:  01/04/2021   FINDINGS: Surgical staples right upper lobe. Slight increased soft tissue density around the staples is unchanged.   Heart size and vascularity normal. Remaining lungs are clear. Negative for pneumonia.   IMPRESSION: Postsurgical resection right upper lobe. Subtle soft tissue density adjacent to the staple line is similar to the prior study.     Electronically Signed   By: Franchot Gallo M.D.   On: 03/08/2021 10:52 I personally reviewed the chest x-ray.  There is some soft tissue density adjacent to the staple line that is unchanged from her film in December.  Consistent with postop scarring.  Impression: Barbara Knight is a 56 year old woman who had a right upper lobectomy in September 2022.   She had a stage Ia (T1, N0) adenocarcinoma.  She had ninth rib fracture and had a lot of neuropathic type pain initially.  That has improved with time, but she does still have some discomfort and occasional paresthesias.  She is no longer requiring any medication for that.  She continues to have some shortness of breath with exertion.  Overall her exercise tolerance is good, but it has not returned to the level it was preoperatively.  She gets short of breath walking up hills and cannot engage in the heavy exertion she was able to preoperatively.  This is a little bit mysterious to me as she had I do not have a great explanation for that.  Her pulmonary function was excellent prior to surgery although her diffusion capacity was slightly lower.  She did have some noncaseating granulomas and notes that there may be some background of sarcoidosis.  There really was not much to speak of on her high-resolution CT from June 2022.  She was having coughing and wheezing issues and those have improved significantly. It may just be still recovering from surgery and likely will continue to improve to some degree.   She saw Dr. Valeta Harms recently and scheduled to follow-up with him after her CT in a couple of months.  She is also seeing Dr. Julien Nordmann again at that time.   Plan: I will plan to see her back after her CT is well. She knows to call if she has any worsening of her symptoms in the meantime.  Barbara Nakayama, MD [facility] [phone]

## 2021-03-23 ENCOUNTER — Encounter: Payer: Self-pay | Admitting: Vascular Surgery

## 2021-03-23 ENCOUNTER — Ambulatory Visit (INDEPENDENT_AMBULATORY_CARE_PROVIDER_SITE_OTHER): Payer: No Typology Code available for payment source | Admitting: Vascular Surgery

## 2021-03-23 ENCOUNTER — Other Ambulatory Visit: Payer: Self-pay

## 2021-03-23 VITALS — BP 125/88 | HR 74 | Temp 98.3°F | Resp 14 | Ht 67.0 in | Wt 160.0 lb

## 2021-03-23 DIAGNOSIS — I8393 Asymptomatic varicose veins of bilateral lower extremities: Secondary | ICD-10-CM | POA: Diagnosis not present

## 2021-03-23 NOTE — Progress Notes (Signed)
? ?ASSESSMENT & PLAN  ? 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  She does not have any large truncal varicosities.  We have discussed the importance of intermittent leg elevation and the proper positioning for this.  I have encouraged her to consider knee-high compression stockings when she is on her feet for prolonged period of time.  We have discussed the importance of exercise specifically walking and water aerobics.  I think she would be a good candidate for sclerotherapy bilaterally.  I have given her Estell Harpin, RN's card so that she can contact her and make arrangements for this.  She will call if her varicose veins or symptoms progress.  I would only consider formal venous reflux testing if her symptoms progress significantly. ? ?REASON FOR CONSULT:   ? ?Self-referral for spider veins bilaterally. ? ?HPI:  ? ?Barbara Knight is a 56 y.o. female who presents with spider veins of both lower extremities.  She has had these for many years but these have become more bothersome.  She has no previous history of DVT and no previous venous procedures.  She does not describe significant symptoms in her legs.  She is mainly concerned with how the varicosities look.  She does elevate her legs some.  She does not wear compression stockings unless she is traveling. ? ?Past Medical History:  ?Diagnosis Date  ? Acquired dilation of ascending aorta and aortic root (HCC)   ? 78mm ascending aorta by CTA and echo 03/2020 and 60mm sinus of valsalva on echo  ? Anemia   ? Bradycardia 12/16/2014  ? Elevated LFTs   ? hepatitis virus was negative  ? Family history of aortic aneurysm   ? grandfather had aortic aneurysm repair and paternal uncle died of ruptured aortic aneurysm  ? Headache(784.0)   ? migraines  ? Hepatitis   ? autoimmune  ? Mitral valvular prolapse   ? posterior mitral vavlve leaflet with mild MR  ? Thyroid disease   ? Has a thyroid cyst  ? ? ?Family  History  ?Problem Relation Age of Onset  ? Lung cancer Mother   ? Barrett's esophagus Mother   ? Hypertension Father   ? Aortic aneurysm Paternal Grandfather   ?     s/p repair  ? Aortic aneurysm Paternal Uncle   ?     died of ruptured aortic aneurysm  ? Colon cancer Neg Hx   ? Colon polyps Neg Hx   ? Esophageal cancer Neg Hx   ? Rectal cancer Neg Hx   ? Stomach cancer Neg Hx   ? ? ?SOCIAL HISTORY: ?Social History  ? ?Tobacco Use  ? Smoking status: Never  ? Smokeless tobacco: Never  ?Substance Use Topics  ? Alcohol use: Not Currently  ?  Comment: stopped 10/21  ? ? ?No Known Allergies ? ?Current Outpatient Medications  ?Medication Sig Dispense Refill  ? budesonide-formoterol (SYMBICORT) 160-4.5 MCG/ACT inhaler Inhale 2 puffs into the lungs in the morning and at bedtime. 10.2 g 6  ? INTRAROSA 6.5 MG INST Place 1 tablet vaginally daily.    ? Multiple Vitamins-Iron (MULTI-VITAMIN/IRON) TABS Take 1 tablet by mouth daily.    ? mycophenolate (MYFORTIC) 360 MG TBEC EC tablet Take 720 mg by mouth 2 (two) times daily.    ? progesterone (PROMETRIUM) 100 MG capsule Take 100 mg by mouth daily.    ? ursodiol (ACTIGALL) 500 MG tablet Take 500 mg by mouth 2 (two) times  daily.  3  ? azithromycin (ZITHROMAX Z-PAK) 250 MG tablet Take 2 tablets PO today, then 1 daily for 4 days (Patient not taking: Reported on 03/23/2021) 6 each 0  ? traMADol (ULTRAM) 50 MG tablet Take 1-2 tablets (50-100 mg total) by mouth every 6 (six) hours as needed. (Patient not taking: Reported on 03/23/2021) 28 tablet 0  ? ?No current facility-administered medications for this visit.  ? ? ?REVIEW OF SYSTEMS:  ?[X]  denotes positive finding, [ ]  denotes negative finding ?Cardiac  Comments:  ?Chest pain or chest pressure:    ?Shortness of breath upon exertion:    ?Short of breath when lying flat:    ?Irregular heart rhythm:    ?    ?Vascular    ?Pain in calf, thigh, or hip brought on by ambulation:    ?Pain in feet at night that wakes you up from your sleep:      ?Blood clot in your veins:    ?Leg swelling:     ?    ?Pulmonary    ?Oxygen at home:    ?Productive cough:     ?Wheezing:     ?    ?Neurologic    ?Sudden weakness in arms or legs:     ?Sudden numbness in arms or legs:     ?Sudden onset of difficulty speaking or slurred speech:    ?Temporary loss of vision in one eye:     ?Problems with dizziness:     ?    ?Gastrointestinal    ?Blood in stool:     ?Vomited blood:     ?    ?Genitourinary    ?Burning when urinating:     ?Blood in urine:    ?    ?Psychiatric    ?Major depression:     ?    ?Hematologic    ?Bleeding problems:    ?Problems with blood clotting too easily:    ?    ?Skin    ?Rashes or ulcers:    ?    ?Constitutional    ?Fever or chills:    ?- ? ?PHYSICAL EXAM:  ? ?Vitals:  ? 03/23/21 1509  ?BP: 125/88  ?Pulse: 74  ?Resp: 14  ?Temp: 98.3 ?F (36.8 ?C)  ?TempSrc: Temporal  ?SpO2: 97%  ?Weight: 160 lb (72.6 kg)  ?Height: 5\' 7"  (1.702 m)  ? ?Body mass index is 25.06 kg/m?. ?GENERAL: The patient is a well-nourished female, in no acute distress. The vital signs are documented above. ?CARDIAC: There is a regular rate and rhythm.  ?VASCULAR: I do not detect carotid bruits. ?She has biphasic dorsalis pedis and posterior tibial signals bilaterally. ?She has spider veins and reticular veins of both lower extremities. ? ? ? ? ? ? ? ? ?PULMONARY: There is good air exchange bilaterally without wheezing or rales.  ?MUSCULOSKELETAL: There are no major deformities. ?NEUROLOGIC: No focal weakness or paresthesias are detected. ?SKIN: There are no ulcers or rashes noted. ?PSYCHIATRIC: The patient has a normal affect. ? ?DATA:   ? ?No new data ? ?Deitra Mayo ?[facility] ?

## 2021-03-25 ENCOUNTER — Encounter (HOSPITAL_COMMUNITY): Payer: PRIVATE HEALTH INSURANCE

## 2021-03-25 ENCOUNTER — Other Ambulatory Visit: Payer: Self-pay

## 2021-03-25 ENCOUNTER — Encounter: Payer: PRIVATE HEALTH INSURANCE | Admitting: Vascular Surgery

## 2021-03-25 ENCOUNTER — Ambulatory Visit (HOSPITAL_COMMUNITY): Payer: No Typology Code available for payment source | Attending: Cardiology

## 2021-03-25 DIAGNOSIS — I341 Nonrheumatic mitral (valve) prolapse: Secondary | ICD-10-CM | POA: Insufficient documentation

## 2021-03-25 DIAGNOSIS — I77819 Aortic ectasia, unspecified site: Secondary | ICD-10-CM | POA: Insufficient documentation

## 2021-03-25 LAB — ECHOCARDIOGRAM COMPLETE
Area-P 1/2: 2.94 cm2
S' Lateral: 3 cm

## 2021-03-30 ENCOUNTER — Encounter: Payer: No Typology Code available for payment source | Admitting: Vascular Surgery

## 2021-04-15 ENCOUNTER — Ambulatory Visit (INDEPENDENT_AMBULATORY_CARE_PROVIDER_SITE_OTHER): Payer: No Typology Code available for payment source

## 2021-04-15 ENCOUNTER — Other Ambulatory Visit: Payer: Self-pay

## 2021-04-15 DIAGNOSIS — I8393 Asymptomatic varicose veins of bilateral lower extremities: Secondary | ICD-10-CM

## 2021-04-15 NOTE — Progress Notes (Signed)
Treated pt's spider veins with Asclera 1% administered with a 27g butterfly.  Patient received a total of 6 mL. ?Pt tolerated very well. Easy access. Pt was given post treatment care instructions on both handout and verbally. Will follow prn. ? ?Photos: Yes.    Compression stockings applied: Yes.   ? ? ? ? ? ? ? ? ? ? ? ? ? ?

## 2021-05-05 ENCOUNTER — Telehealth: Payer: Self-pay | Admitting: Pulmonary Disease

## 2021-05-05 NOTE — Telephone Encounter (Signed)
So it is noted that this patient has an CT that is to be done on 05/13/21 with Dr Earlie Server. Does this patient still need the High Res CT Chest that Dr Valeta Harms ordered. Dr Valeta Harms did not give a time frame that he wanted the CT done.  ? ?Judson Roch please advise ?

## 2021-05-09 NOTE — Telephone Encounter (Signed)
Need this changed by Dr Earlie Server office since theres is the one scheduled  ?

## 2021-05-09 NOTE — Telephone Encounter (Signed)
Order has been changed to high resc  ?

## 2021-05-13 ENCOUNTER — Encounter (HOSPITAL_COMMUNITY): Payer: Self-pay

## 2021-05-13 ENCOUNTER — Inpatient Hospital Stay: Payer: PRIVATE HEALTH INSURANCE | Attending: Internal Medicine

## 2021-05-13 ENCOUNTER — Ambulatory Visit (HOSPITAL_COMMUNITY)
Admission: RE | Admit: 2021-05-13 | Discharge: 2021-05-13 | Disposition: A | Discharge status: Home / Self Care | Payer: PRIVATE HEALTH INSURANCE | Source: Ambulatory Visit | Attending: Internal Medicine | Admitting: Internal Medicine

## 2021-05-13 DIAGNOSIS — K7460 Unspecified cirrhosis of liver: Secondary | ICD-10-CM | POA: Diagnosis not present

## 2021-05-13 DIAGNOSIS — C3411 Malignant neoplasm of upper lobe, right bronchus or lung: Secondary | ICD-10-CM | POA: Diagnosis not present

## 2021-05-13 DIAGNOSIS — R7989 Other specified abnormal findings of blood chemistry: Secondary | ICD-10-CM | POA: Insufficient documentation

## 2021-05-13 DIAGNOSIS — Z7969 Long term (current) use of other immunomodulators and immunosuppressants: Secondary | ICD-10-CM | POA: Insufficient documentation

## 2021-05-13 DIAGNOSIS — Z7951 Long term (current) use of inhaled steroids: Secondary | ICD-10-CM | POA: Diagnosis not present

## 2021-05-13 DIAGNOSIS — M47814 Spondylosis without myelopathy or radiculopathy, thoracic region: Secondary | ICD-10-CM | POA: Diagnosis not present

## 2021-05-13 DIAGNOSIS — C3490 Malignant neoplasm of unspecified part of unspecified bronchus or lung: Secondary | ICD-10-CM | POA: Insufficient documentation

## 2021-05-13 DIAGNOSIS — I700 Atherosclerosis of aorta: Secondary | ICD-10-CM | POA: Diagnosis not present

## 2021-05-13 DIAGNOSIS — R0602 Shortness of breath: Secondary | ICD-10-CM | POA: Diagnosis not present

## 2021-05-13 LAB — CMP (CANCER CENTER ONLY)
ALT: 25 U/L (ref 0–44)
AST: 26 U/L (ref 15–41)
Albumin: 4 g/dL (ref 3.5–5.0)
Alkaline Phosphatase: 155 U/L — ABNORMAL HIGH (ref 38–126)
Anion gap: 8 (ref 5–15)
BUN: 25 mg/dL — ABNORMAL HIGH (ref 6–20)
CO2: 24 mmol/L (ref 22–32)
Calcium: 8.9 mg/dL (ref 8.9–10.3)
Chloride: 108 mmol/L (ref 98–111)
Creatinine: 0.84 mg/dL (ref 0.44–1.00)
GFR, Estimated: >60 mL/min (ref >60)
Glucose, Bld: 100 mg/dL — ABNORMAL HIGH (ref 70–99)
Potassium: 3.9 mmol/L (ref 3.5–5.1)
Sodium: 140 mmol/L (ref 135–145)
Total Bilirubin: 0.7 mg/dL (ref 0.3–1.2)
Total Protein: 6.8 g/dL (ref 6.5–8.1)

## 2021-05-13 LAB — CBC WITH DIFFERENTIAL (CANCER CENTER ONLY)
Abs Immature Granulocytes: 0.01 10*3/uL (ref 0.00–0.07)
Basophils Absolute: 0 10*3/uL (ref 0.0–0.1)
Basophils Relative: 1 %
Eosinophils Absolute: 0.1 10*3/uL (ref 0.0–0.5)
Eosinophils Relative: 1 %
HCT: 39.6 % (ref 36.0–46.0)
Hemoglobin: 13.2 g/dL (ref 12.0–15.0)
Immature Granulocytes: 0 %
Lymphocytes Relative: 28 %
Lymphs Abs: 1.6 10*3/uL (ref 0.7–4.0)
MCH: 28.9 pg (ref 26.0–34.0)
MCHC: 33.3 g/dL (ref 30.0–36.0)
MCV: 86.7 fL (ref 80.0–100.0)
Monocytes Absolute: 0.6 10*3/uL (ref 0.1–1.0)
Monocytes Relative: 10 %
Neutro Abs: 3.6 10*3/uL (ref 1.7–7.7)
Neutrophils Relative %: 60 %
Platelet Count: 171 10*3/uL (ref 150–400)
RBC: 4.57 MIL/uL (ref 3.87–5.11)
RDW: 13.9 % (ref 11.5–15.5)
WBC Count: 5.8 10*3/uL (ref 4.0–10.5)
nRBC: 0 % (ref 0.0–0.2)

## 2021-05-16 ENCOUNTER — Other Ambulatory Visit: Payer: Self-pay

## 2021-05-16 ENCOUNTER — Inpatient Hospital Stay (HOSPITAL_BASED_OUTPATIENT_CLINIC_OR_DEPARTMENT_OTHER): Payer: PRIVATE HEALTH INSURANCE | Admitting: Internal Medicine

## 2021-05-16 VITALS — BP 118/89 | HR 86 | Temp 98.3°F | Resp 16 | Wt 166.9 lb

## 2021-05-16 DIAGNOSIS — C3411 Malignant neoplasm of upper lobe, right bronchus or lung: Secondary | ICD-10-CM | POA: Diagnosis not present

## 2021-05-16 DIAGNOSIS — C3490 Malignant neoplasm of unspecified part of unspecified bronchus or lung: Secondary | ICD-10-CM | POA: Diagnosis not present

## 2021-05-16 NOTE — Progress Notes (Signed)
?    [place] ?[phone]   [phone] ? ?OFFICE PROGRESS NOTE ? ?[name], MD ?[address] ?[place] [address] ? ?DIAGNOSIS: Stage IA (T1b, N0, M0) non-small cell lung cancer, adenocarcinoma presented with right upper lobe lung nodule.  The patient has a suspicious liver lesion seen on recent MRI of the liver at [facility]. ? ?Molecular studies showed positive ALK-EML4 gene translocation with negative PD-L1 expression. ? ?PRIOR THERAPY: Status post right upper lobectomy with lymph node dissection under the care of Dr. Roxan Hockey on October 25, 2020. ? ?CURRENT THERAPY: Observation. ? ?INTERVAL HISTORY: ?Barbara Knight 56 y.o. female returns to the clinic today for follow-up visit.  The patient is feeling fine today with no concerning complaints except for intermittent pain on the right side of the chest from the surgical scar.  She also has shortness of breath with exertion but no significant cough or hemoptysis.  She denied having any fever or chills.  She has no nausea, vomiting, diarrhea or constipation.  She has no headache or visual changes.  She was seen recently at [facility] for evaluation of her liver cirrhosis and MRI of the liver showed 2 new suspicious lesion in the right hepatic lobe.  It is unclear if it is related to her liver cirrhosis or lung cancer.  She is here today for evaluation with repeat CT scan of the chest for restaging of her disease. ? ?MEDICAL HISTORY: ?Past Medical History:  ?Diagnosis Date  ? Acquired dilation of ascending aorta and aortic root (HCC)   ? 35m ascending aorta by CTA and echo 03/2020 and 446msinus of valsalva on echo  ? Anemia   ? Bradycardia 12/16/2014  ? Elevated LFTs   ? hepatitis virus was negative  ? Family history of aortic aneurysm   ? grandfather had aortic aneurysm repair and paternal uncle died of ruptured aortic aneurysm  ? Headache(784.0)   ? migraines  ? Hepatitis   ? autoimmune   ? lung ca 10/25/2020  ? RU-lobectomy  ? Mitral valvular prolapse   ? posterior mitral vavlve leaflet with mild MR  ? Thyroid disease   ? Has a thyroid cyst  ? ? ?ALLERGIES:  has No Known Allergies. ? ?MEDICATIONS:  ?Current Outpatient Medications  ?Medication Sig Dispense Refill  ? azithromycin (ZITHROMAX Z-PAK) 250 MG tablet Take 2 tablets PO today, then 1 daily for 4 days (Patient not taking: Reported on 03/23/2021) 6 each 0  ? budesonide-formoterol (SYMBICORT) 160-4.5 MCG/ACT inhaler Inhale 2 puffs into the lungs in the morning and at bedtime. 10.2 g 6  ? INTRAROSA 6.5 MG INST Place 1 tablet vaginally daily.    ? Multiple Vitamins-Iron (MULTI-VITAMIN/IRON) TABS Take 1 tablet by mouth daily.    ? mycophenolate (MYFORTIC) 360 MG TBEC EC tablet Take 720 mg by mouth 2 (two) times daily.    ? progesterone (PROMETRIUM) 100 MG capsule Take 100 mg by mouth daily.    ? traMADol (ULTRAM) 50 MG tablet Take 1-2 tablets (50-100 mg total) by mouth every 6 (six) hours as needed. (Patient not taking: Reported on 03/23/2021) 28 tablet 0  ? ursodiol (ACTIGALL) 500 MG tablet Take 500 mg by mouth 2 (two) times daily.  3  ? ?No current facility-administered medications for this visit.  ? ? ?SURGICAL HISTORY:  ?Past Surgical History:  ?Procedure Laterality Date  ? BRONCHIAL BIOPSY  10/25/2020  ? Procedure: BRONCHIAL BIOPSIES;  Surgeon: IcGarner NashDO;  Location: [facility];  Service: Pulmonary;;  ? BRONCHIAL BRUSHINGS  10/25/2020  ? Procedure: BRONCHIAL BRUSHINGS;  Surgeon: Garner Nash, DO;  Location: [facility];  Service: Pulmonary;;  ? DILATATION & CURETTAGE/HYSTEROSCOPY WITH TRUECLEAR N/A 09/13/2012  ? Procedure: DILATATION & CURETTAGE/HYSTEROSCOPY WITH TRUECLEAR and THERMACHOICE;  Surgeon: Cyril Mourning, MD;  Location: [facility];  Service: Gynecology;  Laterality: N/A;  ? DILATATION & CURRETTAGE/HYSTEROSCOPY WITH RESECTOCOPE N/A 09/13/2012  ? Procedure: [place];  Surgeon:  Cyril Mourning, MD;  Location: [facility];  Service: Gynecology;  Laterality: N/A;  ? DILATION AND CURETTAGE OF UTERUS    ? INTERCOSTAL NERVE BLOCK Right 10/25/2020  ? Procedure: INTERCOSTAL NERVE BLOCK;  Surgeon: [name], MD;  Location: [facility];  Service: Thoracic;  Laterality: Right;  ? LIVER BIOPSY    ? x 2  ? LYMPH NODE DISSECTION Right 10/25/2020  ? Procedure: LYMPH NODE DISSECTION;  Surgeon: [name], MD;  Location: [place];  Service: Thoracic;  Laterality: Right;  ? SUBMUCOSAL TATTOO INJECTION  10/25/2020  ? Procedure: SUBMUCOSAL TATTOO INJECTION;  Surgeon: Garner Nash, DO;  Location: [facility];  Service: Pulmonary;;  ? UTERINE FIBROID SURGERY    ? VIDEO BRONCHOSCOPY WITH ENDOBRONCHIAL NAVIGATION Right 10/25/2020  ? Procedure: VIDEO BRONCHOSCOPY WITH ENDOBRONCHIAL NAVIGATION;  Surgeon: Garner Nash, DO;  Location: [place];  Service: Pulmonary;  Laterality: Right;  ION + Fiducial Dye Marking  ? ? ?REVIEW OF SYSTEMS:  Constitutional: negative ?Eyes: negative ?Ears, nose, mouth, throat, and face: negative ?Respiratory: positive for dyspnea on exertion and pleurisy/chest pain ?Cardiovascular: negative ?Gastrointestinal: negative ?Genitourinary:negative ?Integument/breast: negative ?Hematologic/lymphatic: negative ?Musculoskeletal:negative ?Neurological: negative ?Behavioral/Psych: negative ?Endocrine: negative ?Allergic/Immunologic: negative  ? ?PHYSICAL EXAMINATION: General appearance: alert, cooperative, and no distress ?Head: Normocephalic, without obvious abnormality, atraumatic ?Neck: no adenopathy, no JVD, supple, symmetrical, trachea midline, and thyroid not enlarged, symmetric, no tenderness/mass/nodules ?Lymph nodes: Cervical, supraclavicular, and axillary nodes normal. ?Resp: clear to auscultation bilaterally ?Back: symmetric, no curvature. ROM normal. No CVA tenderness. ?Cardio: regular rate and rhythm, S1, S2 normal, no murmur, click, rub or gallop ?GI: soft,  non-tender; bowel sounds normal; no masses,  no organomegaly ?Extremities: extremities normal, atraumatic, no cyanosis or edema ?Neurologic: Alert and oriented X 3, normal strength and tone. Normal symmetric reflexes. Normal coordination and gait ? ?ECOG PERFORMANCE STATUS: 1 - Symptomatic but completely ambulatory ? ?Blood pressure 118/89, pulse 86, temperature 98.3 ?F (36.8 ?C), temperature source Tympanic, resp. rate 16, weight 166 lb 14.4 oz (75.7 kg), last menstrual period 11/11/2014, SpO2 97 %. ? ?LABORATORY DATA: ?Lab Results  ?Component Value Date  ? WBC 5.8 05/13/2021  ? HGB 13.2 05/13/2021  ? HCT 39.6 05/13/2021  ? MCV 86.7 05/13/2021  ? PLT 171 05/13/2021  ? ? ?  Chemistry   ?   ?Component Value Date/Time  ? NA 140 05/13/2021 1235  ? NA 141 04/07/2020 1542  ? K 3.9 05/13/2021 1235  ? CL 108 05/13/2021 1235  ? CO2 24 05/13/2021 1235  ? BUN 25 (H) 05/13/2021 1235  ? BUN 19 04/07/2020 1542  ? CREATININE 0.84 05/13/2021 1235  ? CREATININE 0.99 08/04/2015 0842  ?    ?Component Value Date/Time  ? CALCIUM 8.9 05/13/2021 1235  ? ALKPHOS 155 (H) 05/13/2021 1235  ? AST 26 05/13/2021 1235  ? ALT 25 05/13/2021 1235  ? BILITOT 0.7 05/13/2021 1235  ?  ? ? ? ?RADIOGRAPHIC STUDIES: ?CT CHEST HIGH RESOLUTION ? ?Result Date: 05/14/2021 ?CLINICAL DATA:  Status post right upper lobectomy 10/25/2020 for lung adenocarcinoma. Non-necrotizing granulomatous disease identified on thoracic lymph node dissection at the time of lobectomy. History of COVID infection in January 2022. History of autoimmune liver disease. * Tracking Code: BO * EXAM: CT CHEST WITHOUT CONTRAST TECHNIQUE: Multidetector CT imaging of the chest was performed following the standard protocol without intravenous contrast. High resolution imaging of the lungs, as well as inspiratory and expiratory imaging, was performed. RADIATION DOSE REDUCTION: This exam was performed according to the departmental dose-optimization program which includes automated exposure  control, adjustment of the mA and/or kV according to patient size and/or use of iterative reconstruction technique. COMPARISON:  12/01/2020 PET-CT. 07/01/2020 high-resolution chest CT. FINDINGS: Cardiovascular: Norm

## 2021-05-17 ENCOUNTER — Ambulatory Visit: Payer: No Typology Code available for payment source | Admitting: Thoracic Surgery (Cardiothoracic Vascular Surgery)

## 2021-05-17 VITALS — BP 136/93 | HR 75 | Resp 20 | Ht 67.0 in | Wt 165.0 lb

## 2021-05-17 DIAGNOSIS — C3491 Malignant neoplasm of unspecified part of right bronchus or lung: Secondary | ICD-10-CM | POA: Diagnosis not present

## 2021-05-17 DIAGNOSIS — Z902 Acquired absence of lung [part of]: Secondary | ICD-10-CM | POA: Diagnosis not present

## 2021-05-17 NOTE — Progress Notes (Signed)
? ?   ?[place].[address] ?      [place] [address] ?            [phone]   ? ? ?HPI: Dr. Annette Stable returns for a scheduled follow-up ? ?Barbara Knight is 56 year old woman with a history of adenocarcinoma of the lung, right upper lobectomy, autoimmune hepatitis, thyroid nodule, and mitral valve prolapse.  She has a family history of her mother having an EGFR positive adenocarcinoma of the lung.  She had a suspicious nodule in the right upper lobe and underwent a right upper lobectomy in September.  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 ? ?She has a lot of issues with wheezing and shortness of breath with exertion postoperatively.  She also had a lot of pain initially.  The pain improved first but she continued to have issues with that wheezing and dyspnea on exertion when I saw her in February. ? ?In the interim since that visit both of those issues have improved significantly.  She is not back to her preoperative baseline yet but has seen significant improvement.  She recently had a CT which showed a new 3 mm nodule in the right middle lobe.  She also had an MRI of her liver at [facility] which showed 2 new nodules. ? ?Past Medical History:  ?Diagnosis Date  ? Acquired dilation of ascending aorta and aortic root (HCC)   ? 38m ascending aorta by CTA and echo 03/2020 and 464msinus of valsalva on echo  ? Anemia   ? Bradycardia 12/16/2014  ? Elevated LFTs   ? hepatitis virus was negative  ? Family history of aortic aneurysm   ? grandfather had aortic aneurysm repair and paternal uncle died of ruptured aortic aneurysm  ? Headache(784.0)   ? migraines  ? Hepatitis   ? autoimmune  ? lung ca 10/25/2020  ? RU-lobectomy  ? Mitral valvular prolapse   ? posterior mitral vavlve leaflet with mild MR  ? Thyroid disease   ? Has a thyroid cyst  ? ? ?Current Outpatient Medications  ?Medication Sig Dispense Refill  ? INTRAROSA 6.5 MG INST Place 1 tablet vaginally daily.    ? Multiple Vitamins-Iron  (MULTI-VITAMIN/IRON) TABS Take 1 tablet by mouth daily.    ? mycophenolate (MYFORTIC) 360 MG TBEC EC tablet Take 720 mg by mouth 2 (two) times daily.    ? progesterone (PROMETRIUM) 100 MG capsule Take 100 mg by mouth daily.    ? ursodiol (ACTIGALL) 500 MG tablet Take 500 mg by mouth 2 (two) times daily.  3  ? ?No current facility-administered medications for this visit.  ? ? ?Physical Exam ?BP (!) 136/93 (BP Location: Left Arm, Patient Position: Sitting)   Pulse 75   Resp 20   Ht 5' 7"  (1.702 m)   Wt 165 lb (74.8 kg)   LMP 11/11/2014 (Approximate)   SpO2 97% Comment: RA  BMI 25.8469g/m?  ?89ear old woman in no acute distress ?Alert and oriented x3 with no focal deficits ? ?Diagnostic Tests: ?CT CHEST WITHOUT CONTRAST ?  ?TECHNIQUE: ?Multidetector CT imaging of the chest was performed following the ?standard protocol without intravenous contrast. High resolution ?imaging of the lungs, as well as inspiratory and expiratory imaging, ?was performed. ?  ?RADIATION DOSE REDUCTION: This exam was performed according to the ?departmental dose-optimization program which includes automated ?exposure control, adjustment of the mA and/or kV according to ?patient size and/or use of iterative reconstruction technique. ?  ?  COMPARISON:  12/01/2020 PET-CT. 07/01/2020 high-resolution chest CT. ?  ?FINDINGS: ?Cardiovascular: Normal heart size. No significant pericardial ?effusion/thickening. Mildly atherosclerotic thoracic aorta with ?dilated 4.1 cm ascending thoracic aorta. Normal caliber pulmonary ?arteries. ?  ?Mediastinum/Nodes: Multinodular goiter with dominant hypodense 4.2 ?cm right thyroid nodule, stable. This has been evaluated on previous ?imaging. (ref: J Am Coll Radiol. 2015 Feb;12(2): 143-50). ?Unremarkable esophagus. No pathologically enlarged axillary, ?mediastinal or hilar lymph nodes, noting limited sensitivity for the ?detection of hilar adenopathy on this noncontrast study. ?  ?Lungs/Pleura: No pneumothorax.  No pleural effusion. Status post ?right upper lobectomy. No acute consolidative airspace disease or ?lung masses. Peripheral left lower lobe solid 0.2 cm tiny pulmonary ?nodule (series 12/image 113), stable since at least 07/01/2020 CT ?and considered benign. Tiny 0.3 cm central right middle lobe solid ?pulmonary nodule (series 12/image 65), not definitely seen on prior ?chest CT studies. No additional significant pulmonary nodules. No ?significant regions of subpleural reticulation, ground-glass ?opacity, traction bronchiectasis, architectural distortion or frank ?honeycombing. Mild curvilinear parenchymal bands in the upper right ?lung compatible with mild postsurgical scarring. No significant ?lobular air trapping or evidence of tracheobronchomalacia on the ?expiration sequence. ?  ?Upper abdomen: Diffusely markedly irregular liver surface, ?unchanged, compatible with cirrhosis. ?  ?Musculoskeletal: No aggressive appearing focal osseous lesions. ?Healed deformity in the posterior right ninth rib. Mild thoracic ?spondylosis. ?  ?IMPRESSION: ?1. No evidence of interstitial lung disease. ?2. No findings highly suspicious for metastatic disease in the ?chest. Tiny 0.3 cm central right middle lobe solid pulmonary nodule, ?not definitely seen on prior chest CT studies, recommend attention ?on follow-up noncontrast chest CT in 3 months. ?3. Status post right upper lobectomy. ?4. No thoracic adenopathy. ?5. Cirrhosis. ?6. Stable dilated 4.1 cm ascending thoracic aorta. Recommend annual ?imaging followup by CTA or MRA. This recommendation follows 2010 ?ACCF/AHA/AATS/ACR/ASA/SCA/SCAI/SIR/STS/SVM Guidelines for the ?Diagnosis and Management of Patients with Thoracic Aortic Disease. ?Circulation. 2010; 121: G295-M841. Aortic aneurysm NOS ?(ICD10-I71.9). ?7. Aortic Atherosclerosis (ICD10-I70.0). ?  ?  ?Electronically Signed ?  By: Ilona Sorrel M.D. ?  On: 05/14/2021 17:51 ?I personally reviewed the CT images.  There is a 3 mm  right middle lobe nodule.  Hard to know if it was on previous CTs as there is so much anatomic distortion post lobectomy.  Not particular concerning but does warrant follow-up and will be reassessed on a regular basis. ? ?Impression: ?Barbara Knight is a 56 year old non-smoker with a history of ALK-positive adenocarcinoma of the lung, s/p right upper lobectomy, autoimmune hepatitis, thyroid nodule, and mitral valve prolapse.  ? ?From a surgical standpoint she is doing well.  Her wheezing and dyspnea on exertion have improved.  Her exercise tolerance is improving.  Her pain had already improved previously. ? ?3 mm right middle lobe nodule-not typically concerning but does bear follow-up given her history.  It will be reassessed on a regular basis. ? ?MRI findings of liver nodules-scan was done at [facility].  I strongly doubt that it is related to her lung cancer.  Much more likely to be related to her cirrhosis and autoimmune hepatitis.  Would defer to gastroenterology specialist at [facility] on that issue. ? ?Dr. Julien Nordmann plans to do a PET/CT in 3 months follow-up the liver nodules.  That will also give another assessment of the middle lobe nodule on the CT images although it will be too small to have any uptake on PET. ? ?Plan: ?I will see her back in about 3 months after she has her PET. ? ?  [name], MD ?[facility] ?[phone] ? ? ? ?

## 2021-06-01 ENCOUNTER — Ambulatory Visit (INDEPENDENT_AMBULATORY_CARE_PROVIDER_SITE_OTHER): Payer: No Typology Code available for payment source | Admitting: Pulmonary Disease

## 2021-06-01 ENCOUNTER — Encounter: Payer: Self-pay | Admitting: Pulmonary Disease

## 2021-06-01 ENCOUNTER — Ambulatory Visit: Payer: No Typology Code available for payment source | Admitting: Pulmonary Disease

## 2021-06-01 VITALS — BP 108/84 | HR 65 | Temp 98.6°F | Ht 67.0 in | Wt 167.8 lb

## 2021-06-01 DIAGNOSIS — Z902 Acquired absence of lung [part of]: Secondary | ICD-10-CM | POA: Diagnosis not present

## 2021-06-01 DIAGNOSIS — Z789 Other specified health status: Secondary | ICD-10-CM | POA: Diagnosis not present

## 2021-06-01 DIAGNOSIS — R911 Solitary pulmonary nodule: Secondary | ICD-10-CM

## 2021-06-01 DIAGNOSIS — R918 Other nonspecific abnormal finding of lung field: Secondary | ICD-10-CM

## 2021-06-01 DIAGNOSIS — C3490 Malignant neoplasm of unspecified part of unspecified bronchus or lung: Secondary | ICD-10-CM

## 2021-06-01 DIAGNOSIS — K7689 Other specified diseases of liver: Secondary | ICD-10-CM

## 2021-06-01 DIAGNOSIS — C3491 Malignant neoplasm of unspecified part of right bronchus or lung: Secondary | ICD-10-CM

## 2021-06-01 LAB — PULMONARY FUNCTION TEST
DL/VA % pred: 93 %
DL/VA: 3.86 ml/min/mmHg/L
DLCO cor % pred: 96 %
DLCO cor: 21.9 ml/min/mmHg
DLCO unc % pred: 96 %
DLCO unc: 21.77 ml/min/mmHg
FEF 25-75 Post: 2.44 L/sec
FEF 25-75 Pre: 2.55 L/sec
FEF2575-%Change-Post: -4 %
FEF2575-%Pred-Post: 91 %
FEF2575-%Pred-Pre: 96 %
FEV1-%Change-Post: -1 %
FEV1-%Pred-Post: 106 %
FEV1-%Pred-Pre: 107 %
FEV1-Post: 3.1 L
FEV1-Pre: 3.15 L
FEV1FVC-%Change-Post: 1 %
FEV1FVC-%Pred-Pre: 96 %
FEV6-%Change-Post: -2 %
FEV6-%Pred-Post: 110 %
FEV6-%Pred-Pre: 113 %
FEV6-Post: 4.03 L
FEV6-Pre: 4.12 L
FEV6FVC-%Change-Post: 0 %
FEV6FVC-%Pred-Post: 103 %
FEV6FVC-%Pred-Pre: 102 %
FVC-%Change-Post: -2 %
FVC-%Pred-Post: 107 %
FVC-%Pred-Pre: 110 %
FVC-Post: 4.03 L
FVC-Pre: 4.14 L
Post FEV1/FVC ratio: 77 %
Post FEV6/FVC ratio: 100 %
Pre FEV1/FVC ratio: 76 %
Pre FEV6/FVC Ratio: 100 %
RV % pred: 96 %
RV: 2.01 L
TLC % pred: 109 %
TLC: 6.04 L

## 2021-06-01 NOTE — Patient Instructions (Addendum)
Thank you for visiting Dr. Valeta Harms at [facility]. ?Today we recommend the following: ? ?Orders Placed This Encounter  ?Procedures  ? CT CHEST WO CONTRAST  ? ?Repeat CT Chest in year  ? ?Return in about 1 year (around 06/02/2022), or if symptoms worsen or fail to improve, for w/ Dr. Valeta Harms . ? ? ? ?Please do your part to reduce the spread of COVID-19.  ? ?

## 2021-06-01 NOTE — Progress Notes (Signed)
? ?Synopsis: Referred in September 2022 for lung nodule, Dr. Chase Caller, PCP: By [name], MD ? ?Subjective:  ? ?PATIENT ID: Barbara Knight GENDER: female DOB: 1965-06-03, MRN: [id number] ? ?Chief Complaint  ?Patient presents with  ? Follow-up  ?  Patient is here to go over results.   ? ? ?This is a 56 year old female, past medical history of autoimmune hepatitis currently on immunosuppression with mycophenolate. He She follows with Dr. Chase Caller in pulmonary clinic was diagnosed with primary biliary cirrhosis followed at [facility].  She had COVID in January 2022.  She had a small groundglass opacity found on CT imaging her mother was diagnosed with metastatic adenocarcinoma.  She had an initial CT scan of the chest in June 2022 which revealed a subsolid nodular lesion within the apex of the right upper lobe.  We made a decision to follow this up short-term.  In September 2022.  This CT scan was completed on 10/08/2020 which showed interval enlargement of the subsolid lesion now measuring 14 x 9 mm.  Patient was referred today to discuss options regarding lung nodule and next steps. ? ?OV 02/03/2021: Patient here today after follow-up from bronchoscopy and surgical resection for adenocarcinoma of the right lung stage I disease status post robotic assisted lobectomy by Dr. Roxan Hockey.  Patient's postoperative course has been complicated by neuropathic pain treated with Lyrica.  Also having some ongoing shortness of breath.  Molecular testing of the tumor was EGFR negative but ALK positive.  Mother history with EGFR positive metastatic lung cancer.  She was seen in the emergency department in December for shortness of breath treated with a course of steroids and new inhaler regimen.  Last seen in the office by thoracic surgery on 01/04/2021 office note reviewed.  Treated with a course of antibiotics and bronchodilators.  Here today for follow-up. ? ?OV 06/01/2021: Here today for follow-up after recent CT  scan of the chest.  She also had pulmonary function test completed prior to today's office visit.  Reviewed these today in the office she has a normal spirometry normal total lung capacity normal DLCO.  Her HRCT which was complete recently has no evidence of sarcoidosis.  Even though she had nodal disease that had some sarcoid-like reaction.  She also has no evidence of recurrence of malignancy.  She has a small new lung nodule within the right middle lobe 3 mm in size.  We talked today about her imaging for of the liver that was completed at [facility].  She has some work-up pending for this. ? ? ? ? ? ?Past Medical History:  ?Diagnosis Date  ? Acquired dilation of ascending aorta and aortic root (HCC)   ? 84m ascending aorta by CTA and echo 03/2020 and 4109msinus of valsalva on echo  ? Anemia   ? Bradycardia 12/16/2014  ? Elevated LFTs   ? hepatitis virus was negative  ? Family history of aortic aneurysm   ? grandfather had aortic aneurysm repair and paternal uncle died of ruptured aortic aneurysm  ? Headache(784.0)   ? migraines  ? Hepatitis   ? autoimmune  ? lung ca 10/25/2020  ? RU-lobectomy  ? Mitral valvular prolapse   ? posterior mitral vavlve leaflet with mild MR  ? Thyroid disease   ? Has a thyroid cyst  ?  ? ?Family History  ?Problem Relation Age of Onset  ? Lung cancer Mother   ? Barrett's esophagus Mother   ? Hypertension Father   ? Aortic aneurysm  Paternal Grandfather   ?     s/p repair  ? Aortic aneurysm Paternal Uncle   ?     died of ruptured aortic aneurysm  ? Colon cancer Neg Hx   ? Colon polyps Neg Hx   ? Esophageal cancer Neg Hx   ? Rectal cancer Neg Hx   ? Stomach cancer Neg Hx   ?  ? ?Past Surgical History:  ?Procedure Laterality Date  ? BRONCHIAL BIOPSY  10/25/2020  ? Procedure: BRONCHIAL BIOPSIES;  Surgeon: Garner Nash, DO;  Location: [place];  Service: Pulmonary;;  ? BRONCHIAL BRUSHINGS  10/25/2020  ? Procedure: BRONCHIAL BRUSHINGS;  Surgeon: Garner Nash, DO;  Location: [place];   Service: Pulmonary;;  ? DILATATION & CURETTAGE/HYSTEROSCOPY WITH TRUECLEAR N/A 09/13/2012  ? Procedure: DILATATION & CURETTAGE/HYSTEROSCOPY WITH TRUECLEAR and THERMACHOICE;  Surgeon: Cyril Mourning, MD;  Location: [facility];  Service: Gynecology;  Laterality: N/A;  ? DILATATION & CURRETTAGE/HYSTEROSCOPY WITH RESECTOCOPE N/A 09/13/2012  ? Procedure: [place];  Surgeon: Cyril Mourning, MD;  Location: [place] [facility];  Service: Gynecology;  Laterality: N/A;  ? DILATION AND CURETTAGE OF UTERUS    ? INTERCOSTAL NERVE BLOCK Right 10/25/2020  ? Procedure: INTERCOSTAL NERVE BLOCK;  Surgeon: [name], MD;  Location: [place];  Service: Thoracic;  Laterality: Right;  ? LIVER BIOPSY    ? x 2  ? LYMPH NODE DISSECTION Right 10/25/2020  ? Procedure: LYMPH NODE DISSECTION;  Surgeon: [name], MD;  Location: [place];  Service: Thoracic;  Laterality: Right;  ? SUBMUCOSAL TATTOO INJECTION  10/25/2020  ? Procedure: SUBMUCOSAL TATTOO INJECTION;  Surgeon: Garner Nash, DO;  Location: [place] [facility];  Service: Pulmonary;;  ? UTERINE FIBROID SURGERY    ? VIDEO BRONCHOSCOPY WITH ENDOBRONCHIAL NAVIGATION Right 10/25/2020  ? Procedure: VIDEO BRONCHOSCOPY WITH ENDOBRONCHIAL NAVIGATION;  Surgeon: Garner Nash, DO;  Location: [place];  Service: Pulmonary;  Laterality: Right;  ION + Fiducial Dye Marking  ? ? ?Social History  ? ?Socioeconomic History  ? Marital status: Married  ?  Spouse name: Not on file  ? Number of children: Not on file  ? Years of education: Not on file  ? Highest education level: Not on file  ?Occupational History  ? Not on file  ?Tobacco Use  ? Smoking status: Never  ? Smokeless tobacco: Never  ?Vaping Use  ? Vaping Use: Never used  ?Substance and Sexual Activity  ? Alcohol use: Not Currently  ?  Comment: stopped 10/21  ? Drug use: No  ? Sexual activity: Yes  ?Other Topics Concern  ? Not on file  ?Social History Narrative  ? Not on file  ? ?Social  Determinants of Health  ? ?Financial Resource Strain: Not on file  ?Food Insecurity: Not on file  ?Transportation Needs: Not on file  ?Physical Activity: Not on file  ?Stress: Not on file  ?Social Connections: Not on file  ?Intimate Partner Violence: Not on file  ?  ? ?No Known Allergies  ? ?Outpatient Medications Prior to Visit  ?Medication Sig Dispense Refill  ? INTRAROSA 6.5 MG INST Place 1 tablet vaginally daily.    ? Multiple Vitamins-Iron (MULTI-VITAMIN/IRON) TABS Take 1 tablet by mouth daily.    ? mycophenolate (MYFORTIC) 360 MG TBEC EC tablet Take 720 mg by mouth 2 (two) times daily.    ? progesterone (PROMETRIUM) 100 MG capsule Take 100 mg by mouth daily.    ? ursodiol (ACTIGALL) 500 MG  tablet Take 500 mg by mouth 2 (two) times daily.  3  ? ?No facility-administered medications prior to visit.  ? ? ?Review of Systems  ?Constitutional:  Negative for chills, fever, malaise/fatigue and weight loss.  ?HENT:  Negative for hearing loss, sore throat and tinnitus.   ?Eyes:  Negative for blurred vision and double vision.  ?Respiratory:  Negative for cough, hemoptysis, sputum production, shortness of breath, wheezing and stridor.   ?Cardiovascular:  Negative for chest pain, palpitations, orthopnea, leg swelling and PND.  ?Gastrointestinal:  Negative for abdominal pain, constipation, diarrhea, heartburn, nausea and vomiting.  ?Genitourinary:  Negative for dysuria, hematuria and urgency.  ?Musculoskeletal:  Negative for joint pain and myalgias.  ?Skin:  Negative for itching and rash.  ?Neurological:  Negative for dizziness, tingling, weakness and headaches.  ?Endo/Heme/Allergies:  Negative for environmental allergies. Does not bruise/bleed easily.  ?Psychiatric/Behavioral:  Negative for depression. The patient is not nervous/anxious and does not have insomnia.   ?All other systems reviewed and are negative. ? ? ?Objective:  ?Physical Exam ?Vitals reviewed.  ?Constitutional:   ?   General: She is not in acute  distress. ?   Appearance: She is well-developed.  ?HENT:  ?   Head: Normocephalic and atraumatic.  ?Eyes:  ?   General: No scleral icterus. ?   Conjunctiva/sclera: Conjunctivae normal.  ?   Pupils: Pupils are equal, round,

## 2021-06-01 NOTE — Progress Notes (Signed)
PFT done today. 

## 2021-06-23 ENCOUNTER — Encounter: Payer: Self-pay | Admitting: *Deleted

## 2021-06-23 DIAGNOSIS — R911 Solitary pulmonary nodule: Secondary | ICD-10-CM

## 2021-07-05 ENCOUNTER — Telehealth: Payer: Self-pay | Admitting: Internal Medicine

## 2021-07-05 NOTE — Telephone Encounter (Signed)
Called patient regarding upcoming July appointment, left a voicemail.

## 2021-08-04 ENCOUNTER — Other Ambulatory Visit: Payer: Self-pay | Admitting: Internal Medicine

## 2021-08-04 ENCOUNTER — Encounter: Payer: Self-pay | Admitting: Internal Medicine

## 2021-08-04 DIAGNOSIS — K769 Liver disease, unspecified: Secondary | ICD-10-CM

## 2021-08-04 DIAGNOSIS — C3490 Malignant neoplasm of unspecified part of unspecified bronchus or lung: Secondary | ICD-10-CM

## 2021-08-05 ENCOUNTER — Encounter: Payer: Self-pay | Admitting: Medical Oncology

## 2021-08-12 ENCOUNTER — Inpatient Hospital Stay: Payer: No Typology Code available for payment source | Attending: Internal Medicine

## 2021-08-12 ENCOUNTER — Ambulatory Visit (HOSPITAL_COMMUNITY)
Admission: RE | Admit: 2021-08-12 | Discharge: 2021-08-12 | Disposition: A | Discharge status: Home / Self Care | Payer: PRIVATE HEALTH INSURANCE | Source: Ambulatory Visit | Attending: Internal Medicine | Admitting: Internal Medicine

## 2021-08-12 DIAGNOSIS — C3411 Malignant neoplasm of upper lobe, right bronchus or lung: Secondary | ICD-10-CM | POA: Insufficient documentation

## 2021-08-12 DIAGNOSIS — K7460 Unspecified cirrhosis of liver: Secondary | ICD-10-CM | POA: Insufficient documentation

## 2021-08-12 DIAGNOSIS — N200 Calculus of kidney: Secondary | ICD-10-CM | POA: Insufficient documentation

## 2021-08-12 DIAGNOSIS — C3490 Malignant neoplasm of unspecified part of unspecified bronchus or lung: Secondary | ICD-10-CM | POA: Insufficient documentation

## 2021-08-12 DIAGNOSIS — K769 Liver disease, unspecified: Secondary | ICD-10-CM | POA: Diagnosis not present

## 2021-08-12 DIAGNOSIS — E079 Disorder of thyroid, unspecified: Secondary | ICD-10-CM | POA: Insufficient documentation

## 2021-08-12 DIAGNOSIS — R948 Abnormal results of function studies of other organs and systems: Secondary | ICD-10-CM | POA: Insufficient documentation

## 2021-08-12 DIAGNOSIS — Z79624 Long term (current) use of inhibitors of nucleotide synthesis: Secondary | ICD-10-CM | POA: Insufficient documentation

## 2021-08-12 LAB — CBC WITH DIFFERENTIAL (CANCER CENTER ONLY)
Abs Immature Granulocytes: 0.01 10*3/uL (ref 0.00–0.07)
Basophils Absolute: 0 10*3/uL (ref 0.0–0.1)
Basophils Relative: 0 %
Eosinophils Absolute: 0.1 10*3/uL (ref 0.0–0.5)
Eosinophils Relative: 1 %
HCT: 40.8 % (ref 36.0–46.0)
Hemoglobin: 13.6 g/dL (ref 12.0–15.0)
Immature Granulocytes: 0 %
Lymphocytes Relative: 34 %
Lymphs Abs: 2 10*3/uL (ref 0.7–4.0)
MCH: 29.2 pg (ref 26.0–34.0)
MCHC: 33.3 g/dL (ref 30.0–36.0)
MCV: 87.7 fL (ref 80.0–100.0)
Monocytes Absolute: 0.7 10*3/uL (ref 0.1–1.0)
Monocytes Relative: 12 %
Neutro Abs: 3 10*3/uL (ref 1.7–7.7)
Neutrophils Relative %: 53 %
Platelet Count: 159 10*3/uL (ref 150–400)
RBC: 4.65 MIL/uL (ref 3.87–5.11)
RDW: 14 % (ref 11.5–15.5)
WBC Count: 5.8 10*3/uL (ref 4.0–10.5)
nRBC: 0 % (ref 0.0–0.2)

## 2021-08-12 LAB — CMP (CANCER CENTER ONLY)
ALT: 27 U/L (ref 0–44)
AST: 28 U/L (ref 15–41)
Albumin: 4.3 g/dL (ref 3.5–5.0)
Alkaline Phosphatase: 134 U/L — ABNORMAL HIGH (ref 38–126)
Anion gap: 7 (ref 5–15)
BUN: 20 mg/dL (ref 6–20)
CO2: 26 mmol/L (ref 22–32)
Calcium: 9.4 mg/dL (ref 8.9–10.3)
Chloride: 107 mmol/L (ref 98–111)
Creatinine: 0.84 mg/dL (ref 0.44–1.00)
GFR, Estimated: >60 mL/min (ref >60)
Glucose, Bld: 90 mg/dL (ref 70–99)
Potassium: 4.1 mmol/L (ref 3.5–5.1)
Sodium: 140 mmol/L (ref 135–145)
Total Bilirubin: 0.5 mg/dL (ref 0.3–1.2)
Total Protein: 6.9 g/dL (ref 6.5–8.1)

## 2021-08-12 MED ORDER — IOHEXOL 300 MG/ML  SOLN
100.0000 mL | Freq: Once | INTRAMUSCULAR | Status: AC | PRN
  Administered 2021-08-12: 100 mL via INTRAVENOUS

## 2021-08-15 ENCOUNTER — Other Ambulatory Visit: Payer: Self-pay

## 2021-08-15 ENCOUNTER — Inpatient Hospital Stay (HOSPITAL_BASED_OUTPATIENT_CLINIC_OR_DEPARTMENT_OTHER): Payer: No Typology Code available for payment source | Admitting: Internal Medicine

## 2021-08-15 VITALS — BP 127/87 | HR 68 | Temp 98.4°F | Resp 16 | Ht 67.0 in | Wt 163.6 lb

## 2021-08-15 DIAGNOSIS — K7460 Unspecified cirrhosis of liver: Secondary | ICD-10-CM | POA: Diagnosis not present

## 2021-08-15 DIAGNOSIS — C3491 Malignant neoplasm of unspecified part of right bronchus or lung: Secondary | ICD-10-CM

## 2021-08-15 DIAGNOSIS — N200 Calculus of kidney: Secondary | ICD-10-CM | POA: Diagnosis not present

## 2021-08-15 DIAGNOSIS — E079 Disorder of thyroid, unspecified: Secondary | ICD-10-CM | POA: Diagnosis not present

## 2021-08-15 DIAGNOSIS — C3490 Malignant neoplasm of unspecified part of unspecified bronchus or lung: Secondary | ICD-10-CM | POA: Diagnosis not present

## 2021-08-15 DIAGNOSIS — R948 Abnormal results of function studies of other organs and systems: Secondary | ICD-10-CM | POA: Diagnosis not present

## 2021-08-15 DIAGNOSIS — C3411 Malignant neoplasm of upper lobe, right bronchus or lung: Secondary | ICD-10-CM | POA: Diagnosis present

## 2021-08-15 DIAGNOSIS — Z79624 Long term (current) use of inhibitors of nucleotide synthesis: Secondary | ICD-10-CM | POA: Diagnosis not present

## 2021-08-15 NOTE — Progress Notes (Signed)
[place] [phone]   Fax:([phone]) [place], [place] [address]  DIAGNOSIS: Stage IA (T1b, N0, M0) non-small cell lung cancer, adenocarcinoma presented with right upper lobe lung nodule.  The patient has a suspicious liver lesion seen on recent MRI of the liver at [facility].  Molecular studies showed positive ALK-EML4 gene translocation with negative PD-L1 expression.  PRIOR THERAPY: Status post right upper lobectomy with lymph node dissection under the care of Dr. Roxan Hockey on October 25, 2020.  CURRENT THERAPY: Observation.  INTERVAL HISTORY: Barbara Knight 56 y.o. female returns to the clinic today for follow-up visit.  The patient is feeling fine today with no concerning complaints except for intermittent pain on the right lower rib cage from the previous surgical scar.  She denied having any shortness of breath, cough or hemoptysis.  She has no nausea, vomiting, diarrhea or constipation.  She has no headache or visual changes.  She has no recent weight loss or night sweats.  She is here today for evaluation and repeat CT scan of the chest, abdomen and pelvis for restaging of her disease.  MEDICAL HISTORY: Past Medical History:  Diagnosis Date   Acquired dilation of ascending aorta and aortic root (HCC)    37m ascending aorta by CTA and echo 03/2020 and 455msinus of valsalva on echo   Anemia    Bradycardia 12/16/2014   Elevated LFTs    hepatitis virus was negative   Family history of aortic aneurysm    grandfather had aortic aneurysm repair and paternal uncle died of ruptured aortic aneurysm   Headache(784.0)    migraines   Hepatitis    autoimmune   lung ca 10/25/2020   RU-lobectomy   Mitral valvular prolapse    posterior mitral vavlve leaflet with mild MR   Thyroid disease    Has a thyroid cyst    ALLERGIES:  has No Known Allergies.  MEDICATIONS:   Current Outpatient Medications  Medication Sig Dispense Refill   INTRAROSA 6.5 MG INST Place 1 tablet vaginally daily.     Multiple Vitamins-Iron (MULTI-VITAMIN/IRON) TABS Take 1 tablet by mouth daily.     mycophenolate (MYFORTIC) 360 MG TBEC EC tablet Take 720 mg by mouth 2 (two) times daily.     progesterone (PROMETRIUM) 100 MG capsule Take 100 mg by mouth daily.     ursodiol (ACTIGALL) 500 MG tablet Take 500 mg by mouth 2 (two) times daily.  3   No current facility-administered medications for this visit.    SURGICAL HISTORY:  Past Surgical History:  Procedure Laterality Date   BRONCHIAL BIOPSY  10/25/2020   Procedure: BRONCHIAL BIOPSIES;  Surgeon: IcGarner NashDO;  [facility];  Service: Pulmonary;;   BRONCHIAL BRUSHINGS  10/25/2020   Procedure: BRONCHIAL BRUSHINGS;  Surgeon: IcGarner NashDO;  [facility];  Service: Pulmonary;;   DILATATION & CURETTAGE/HYSTEROSCOPY WITH TRUECLEAR N/A 09/13/2012   Procedure: DILATATION & CURETTAGE/HYSTEROSCOPY WITH TRUECLEAR and THERMACHOICE;  Surgeon: MiCyril MourningMD;  [facility];  Service: Gynecology;  Laterality: N/A;   DILATATION & CURRETTAGE/HYSTEROSCOPY WITH RESECTOCOPE N/A 09/13/2012   Procedure[place]geon: MiCyril MourningMD;  [facility];  Service: Gynecology;  Laterality: N/A;   DILATION AND CURETTAGE OF UTERUS     INTERCOSTAL NERVE BLOCK Right 10/25/2020   Procedure: INTERCOSTAL NERVE BLOCK;  Surgeon:  [name], MD;  Location: [facility];  Service: Thoracic;  Laterality: Right;   LIVER BIOPSY     x 2   LYMPH NODE DISSECTION Right 10/25/2020   Procedure: LYMPH NODE DISSECTION;  Surgeon: [name], MD;  Location: [facility];  Service: Thoracic;  Laterality: Right;   SUBMUCOSAL TATTOO INJECTION  10/25/2020   Procedure: SUBMUCOSAL TATTOO INJECTION;  Surgeon: Garner Nash, DO;  Location: [facility];  Service: Pulmonary;;    UTERINE FIBROID SURGERY     VIDEO BRONCHOSCOPY WITH ENDOBRONCHIAL NAVIGATION Right 10/25/2020   Procedure: VIDEO BRONCHOSCOPY WITH ENDOBRONCHIAL NAVIGATION;  Surgeon: Garner Nash, DO;  Location: [facility];  Service: Pulmonary;  Laterality: Right;  ION + Fiducial Dye Marking    REVIEW OF SYSTEMS:  A comprehensive review of systems was negative except for: Respiratory: positive for pleurisy/chest pain   PHYSICAL EXAMINATION: General appearance: alert, cooperative, and no distress Head: Normocephalic, without obvious abnormality, atraumatic Neck: no adenopathy, no JVD, supple, symmetrical, trachea midline, and thyroid not enlarged, symmetric, no tenderness/mass/nodules Lymph nodes: Cervical, supraclavicular, and axillary nodes normal. Resp: clear to auscultation bilaterally Back: symmetric, no curvature. ROM normal. No CVA tenderness. Cardio: regular rate and rhythm, S1, S2 normal, no murmur, click, rub or gallop GI: soft, non-tender; bowel sounds normal; no masses,  no organomegaly Extremities: extremities normal, atraumatic, no cyanosis or edema  ECOG PERFORMANCE STATUS: 1 - Symptomatic but completely ambulatory  Blood pressure 127/87, pulse 68, temperature 98.4 F (36.9 C), temperature source Oral, resp. rate 16, height 5' 7"  (1.702 m), weight 163 lb 9.6 oz (74.2 kg), last menstrual period 11/11/2014, SpO2 100 %.  LABORATORY DATA: Lab Results  Component Value Date   WBC 5.8 08/12/2021   HGB 13.6 08/12/2021   HCT 40.8 08/12/2021   MCV 87.7 08/12/2021   PLT 159 08/12/2021      Chemistry      Component Value Date/Time   NA 140 08/12/2021 1617   NA 141 04/07/2020 1542   K 4.1 08/12/2021 1617   CL 107 08/12/2021 1617   CO2 26 08/12/2021 1617   BUN 20 08/12/2021 1617   BUN 19 04/07/2020 1542   CREATININE 0.84 08/12/2021 1617   CREATININE 0.99 08/04/2015 0842      Component Value Date/Time   CALCIUM 9.4 08/12/2021 1617   ALKPHOS 134 (H) 08/12/2021 1617   AST 28  08/12/2021 1617   ALT 27 08/12/2021 1617   BILITOT 0.5 08/12/2021 1617       RADIOGRAPHIC STUDIES: CT Chest W Contrast  Result Date: 08/14/2021 CLINICAL DATA:  Non-small cell lung cancer, status post right upper lobectomy, history of autoimmune liver disease, liver lesion * Tracking Code: BO * EXAM: CT CHEST AND ABDOMEN WITH CONTRAST TECHNIQUE: Multidetector CT imaging of the chest and abdomen was performed following the standard protocol during bolus administration of intravenous contrast. RADIATION DOSE REDUCTION: This exam was performed according to the departmental dose-optimization program which includes automated exposure control, adjustment of the mA and/or kV according to patient size and/or use of iterative reconstruction technique. CONTRAST:  150m OMNIPAQUE IOHEXOL 300 MG/ML SOLN additional oral enteric contrast COMPARISON:  CT chest, 05/13/2021, PET-CT, 12/01/2020 FINDINGS: CT CHEST FINDINGS Cardiovascular: No significant vascular findings. Normal heart size. No pericardial effusion. Mediastinum/Nodes: No enlarged mediastinal, hilar, or axillary lymph nodes. Unchanged heterogeneous right lobe thyroid nodule, previously biopsied. This has been evaluated on previous imaging. (ref: J Am Coll Radiol. 2015 Feb;12(2): 143-50). Trachea, and esophagus demonstrate no significant findings. Lungs/Pleura: Unchanged  postoperative findings status post right upper lobectomy. Occasional tiny pulmonary nodules are stable, for example of the right middle lobe measuring 0.2 cm (series 5, image 64) and a 0.2 cm nodule of the dependent left lower lobe (series 5, image 99). No pleural effusion or pneumothorax. Musculoskeletal: No chest wall mass or suspicious osseous lesions identified. CT ABDOMEN FINDINGS Hepatobiliary: Cirrhotic morphology of the liver. No focal liver lesion. No gallstones, gallbladder wall thickening, or biliary dilatation. Pancreas: Unremarkable. No pancreatic ductal dilatation or surrounding  inflammatory changes. Spleen: Normal in size without significant abnormality. Adrenals/Urinary Tract: Adrenal glands are unremarkable. Small nonobstructive bilateral renal calculi. No ureteral calculi or hydronephrosis. Bladder is unremarkable. Stomach/Bowel: Stomach is within normal limits. No evidence of bowel wall thickening, distention, or inflammatory changes. Vascular/Lymphatic: No significant vascular findings are present. No enlarged abdominal lymph nodes. Other: No abdominal wall hernia or abnormality. No ascites. Musculoskeletal: No acute osseous findings. IMPRESSION: 1. Unchanged postoperative findings status post right upper lobectomy. 2. Occasional tiny pulmonary nodules are stable, almost certainly benign and incidental. Attention on follow-up. 3. Cirrhosis.  No focal liver lesion. 4. Nonobstructive bilateral nephrolithiasis. Electronically Signed   By: Delanna Ahmadi M.D.   On: 08/14/2021 19:22   CT Abdomen W Contrast  Result Date: 08/14/2021 CLINICAL DATA:  Non-small cell lung cancer, status post right upper lobectomy, history of autoimmune liver disease, liver lesion * Tracking Code: BO * EXAM: CT CHEST AND ABDOMEN WITH CONTRAST TECHNIQUE: Multidetector CT imaging of the chest and abdomen was performed following the standard protocol during bolus administration of intravenous contrast. RADIATION DOSE REDUCTION: This exam was performed according to the departmental dose-optimization program which includes automated exposure control, adjustment of the mA and/or kV according to patient size and/or use of iterative reconstruction technique. CONTRAST:  185m OMNIPAQUE IOHEXOL 300 MG/ML SOLN additional oral enteric contrast COMPARISON:  CT chest, 05/13/2021, PET-CT, 12/01/2020 FINDINGS: CT CHEST FINDINGS Cardiovascular: No significant vascular findings. Normal heart size. No pericardial effusion. Mediastinum/Nodes: No enlarged mediastinal, hilar, or axillary lymph nodes. Unchanged heterogeneous right  lobe thyroid nodule, previously biopsied. This has been evaluated on previous imaging. (ref: J Am Coll Radiol. 2015 Feb;12(2): 143-50). Trachea, and esophagus demonstrate no significant findings. Lungs/Pleura: Unchanged postoperative findings status post right upper lobectomy. Occasional tiny pulmonary nodules are stable, for example of the right middle lobe measuring 0.2 cm (series 5, image 64) and a 0.2 cm nodule of the dependent left lower lobe (series 5, image 99). No pleural effusion or pneumothorax. Musculoskeletal: No chest wall mass or suspicious osseous lesions identified. CT ABDOMEN FINDINGS Hepatobiliary: Cirrhotic morphology of the liver. No focal liver lesion. No gallstones, gallbladder wall thickening, or biliary dilatation. Pancreas: Unremarkable. No pancreatic ductal dilatation or surrounding inflammatory changes. Spleen: Normal in size without significant abnormality. Adrenals/Urinary Tract: Adrenal glands are unremarkable. Small nonobstructive bilateral renal calculi. No ureteral calculi or hydronephrosis. Bladder is unremarkable. Stomach/Bowel: Stomach is within normal limits. No evidence of bowel wall thickening, distention, or inflammatory changes. Vascular/Lymphatic: No significant vascular findings are present. No enlarged abdominal lymph nodes. Other: No abdominal wall hernia or abnormality. No ascites. Musculoskeletal: No acute osseous findings. IMPRESSION: 1. Unchanged postoperative findings status post right upper lobectomy. 2. Occasional tiny pulmonary nodules are stable, almost certainly benign and incidental. Attention on follow-up. 3. Cirrhosis.  No focal liver lesion. 4. Nonobstructive bilateral nephrolithiasis. Electronically Signed   By: ADelanna AhmadiM.D.   On: 08/14/2021 19:22    ASSESSMENT AND PLAN: This is a very pleasant 56years old white  female diagnosed with a stage IA (T1b, N0, M0) non-small cell lung cancer, adenocarcinoma presented with right upper lobe lung nodule  status post right upper lobectomy with lymph node dissection under the care of Dr. Roxan Hockey on October 25, 2020.  The patient had molecular studies that showed positive ALK-EML4 gene translocation and negative PD-L1 expression. The patient is currently on observation and she is feeling fine today with no concerning complaints. She had repeat CT scan of the chest, abdomen and pelvis performed recently.  I personally and independently reviewed the scan images and discussed results with the patient today. Her scan showed no concerning findings for disease recurrence or metastasis and she continues to have the tiny pulmonary nodules that are stable but we will need to monitor it closely. I recommended for the patient to continue on observation with repeat CT scan of the chest, abdomen and pelvis in 6 months for restaging of her disease. She was also advised to call immediately if she has any concerning symptoms in the interval. The patient voices understanding of current disease status and treatment options and is in agreement with the current care plan.  All questions were answered. The patient knows to call the clinic with any problems, questions or concerns. We can certainly see the patient much sooner if necessary.  The total time spent in the appointment was 20 minutes.  Disclaimer: This note was dictated with voice recognition software. Similar sounding words can inadvertently be transcribed and may not be corrected upon review.

## 2021-08-16 ENCOUNTER — Ambulatory Visit (INDEPENDENT_AMBULATORY_CARE_PROVIDER_SITE_OTHER): Payer: No Typology Code available for payment source | Admitting: Thoracic Surgery (Cardiothoracic Vascular Surgery)

## 2021-08-16 DIAGNOSIS — C3491 Malignant neoplasm of unspecified part of right bronchus or lung: Secondary | ICD-10-CM | POA: Diagnosis not present

## 2021-08-16 NOTE — Progress Notes (Signed)
[address]       [place] [address]             [phone]      This visit was conducted as a telephone visit at the patient's request.  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  She had a right upper lobectomy for stage Ia adenocarcinoma in September 2022.  The tumor was ALK positive.  Postoperatively she had issues with wheezing and shortness of breath and neuropathic pain.  All of those issues have improved.  Currently she still gets some dyspnea with exertion when walking up a hill but not during her routine activities.  No issues related to her liver disease that she is aware of.  Past Medical History:  Diagnosis Date   Acquired dilation of ascending aorta and aortic root (HCC)    32m ascending aorta by CTA and echo 03/2020 and 438msinus of valsalva on echo   Anemia    Bradycardia 12/16/2014   Elevated LFTs    hepatitis virus was negative   Family history of aortic aneurysm    grandfather had aortic aneurysm repair and paternal uncle died of ruptured aortic aneurysm   Headache(784.0)    migraines   Hepatitis    autoimmune   lung ca 10/25/2020   RU-lobectomy   Mitral valvular prolapse    posterior mitral vavlve leaflet with mild MR   Thyroid disease    Has a thyroid cyst    Current Outpatient Medications  Medication Sig Dispense Refill   INTRAROSA 6.5 MG INST Place 1 tablet vaginally daily.     Multiple Vitamins-Iron (MULTI-VITAMIN/IRON) TABS Take 1 tablet by mouth daily.     mycophenolate (MYFORTIC) 360 MG TBEC EC tablet Take 720 mg by mouth 2 (two) times daily.     progesterone (PROMETRIUM) 100 MG capsule Take 100 mg by mouth daily.     ursodiol (ACTIGALL) 500 MG tablet Take 500 mg by mouth 2 (two) times daily.  3   No current  facility-administered medications for this visit.    Physical Exam Telephone visit  Diagnostic Tests: CT CHEST AND ABDOMEN WITH CONTRAST   TECHNIQUE: Multidetector CT imaging of the chest and abdomen was performed following the standard protocol during bolus administration of intravenous contrast.   RADIATION DOSE REDUCTION: This exam was performed according to the departmental dose-optimization program which includes automated exposure control, adjustment of the mA and/or kV according to patient size and/or use of iterative reconstruction technique.   CONTRAST:  10013mMNIPAQUE IOHEXOL 300 MG/ML SOLN additional oral enteric contrast   COMPARISON:  CT chest, 05/13/2021, PET-CT, 12/01/2020   FINDINGS: CT CHEST FINDINGS   Cardiovascular: No significant vascular findings. Normal heart size. No pericardial effusion.   Mediastinum/Nodes: No enlarged mediastinal, hilar, or axillary lymph nodes. Unchanged heterogeneous right lobe thyroid nodule, previously biopsied. This has been evaluated on previous imaging. (ref: J Am Coll Radiol. 2015 Feb;12(2): 143-50). Trachea, and esophagus demonstrate no significant findings.   Lungs/Pleura: Unchanged postoperative findings status post right upper lobectomy. Occasional tiny pulmonary nodules are stable, for example of the right middle lobe measuring 0.2 cm (series 5, image 64) and a 0.2 cm nodule of the dependent left lower lobe (series 5, image 99). No pleural effusion or pneumothorax.   Musculoskeletal: No  chest wall mass or suspicious osseous lesions identified.   CT ABDOMEN FINDINGS   Hepatobiliary: Cirrhotic morphology of the liver. No focal liver lesion. No gallstones, gallbladder wall thickening, or biliary dilatation.   Pancreas: Unremarkable. No pancreatic ductal dilatation or surrounding inflammatory changes.   Spleen: Normal in size without significant abnormality.   Adrenals/Urinary Tract: Adrenal glands are  unremarkable. Small nonobstructive bilateral renal calculi. No ureteral calculi or hydronephrosis. Bladder is unremarkable.   Stomach/Bowel: Stomach is within normal limits. No evidence of bowel wall thickening, distention, or inflammatory changes.   Vascular/Lymphatic: No significant vascular findings are present. No enlarged abdominal lymph nodes.   Other: No abdominal wall hernia or abnormality. No ascites.   Musculoskeletal: No acute osseous findings.   IMPRESSION: 1. Unchanged postoperative findings status post right upper lobectomy. 2. Occasional tiny pulmonary nodules are stable, almost certainly benign and incidental. Attention on follow-up. 3. Cirrhosis.  No focal liver lesion. 4. Nonobstructive bilateral nephrolithiasis.     Electronically Signed   By: Delanna Ahmadi M.D.   On: 08/14/2021 19:22 I personally reviewed the CT images.  No evidence of recurrent disease.  Small nodules noted as pointed out by radiology.  Stable.  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  She had a right upper lobectomy for stage Ia adenocarcinoma in September 2022.  The tumor was ALK positive.  Postoperative pain improved  Still has some dyspnea with exertion.  Sensitive to air quality.  But overall improved.  Stage Ia adenocarcinoma of the lung-no evidence of recurrent disease.  Currently under observation.  Plan: Follow-up as scheduled with Dr. Julien Nordmann Will have a repeat visit in 6 months after next CT  I spent 50 minutes in review of records, images, and consultation with Dr. Annette Stable by phone today. [name], MD [facility] [phone]

## 2021-08-24 ENCOUNTER — Telehealth: Payer: Self-pay

## 2021-08-24 ENCOUNTER — Telehealth: Payer: Self-pay | Admitting: Internal Medicine

## 2021-08-24 NOTE — Telephone Encounter (Signed)
Called patient regarding upcoming 2024 appointment, left a voicemail. 

## 2021-11-16 DIAGNOSIS — K7460 Unspecified cirrhosis of liver: Secondary | ICD-10-CM | POA: Diagnosis not present

## 2021-11-16 DIAGNOSIS — K7689 Other specified diseases of liver: Secondary | ICD-10-CM | POA: Diagnosis not present

## 2021-11-16 DIAGNOSIS — K766 Portal hypertension: Secondary | ICD-10-CM | POA: Diagnosis not present

## 2022-02-13 ENCOUNTER — Ambulatory Visit (HOSPITAL_COMMUNITY)
Admission: RE | Admit: 2022-02-13 | Discharge: 2022-02-13 | Disposition: A | Discharge status: Home / Self Care | Payer: BC Managed Care – PPO | Source: Ambulatory Visit | Attending: Internal Medicine | Admitting: Internal Medicine

## 2022-02-13 ENCOUNTER — Other Ambulatory Visit: Payer: Self-pay

## 2022-02-13 ENCOUNTER — Inpatient Hospital Stay: Payer: BC Managed Care – PPO | Attending: Internal Medicine

## 2022-02-13 DIAGNOSIS — C3490 Malignant neoplasm of unspecified part of unspecified bronchus or lung: Secondary | ICD-10-CM | POA: Diagnosis not present

## 2022-02-13 DIAGNOSIS — R911 Solitary pulmonary nodule: Secondary | ICD-10-CM | POA: Diagnosis not present

## 2022-02-13 DIAGNOSIS — C3411 Malignant neoplasm of upper lobe, right bronchus or lung: Secondary | ICD-10-CM | POA: Insufficient documentation

## 2022-02-13 DIAGNOSIS — K7689 Other specified diseases of liver: Secondary | ICD-10-CM | POA: Diagnosis not present

## 2022-02-13 DIAGNOSIS — Z902 Acquired absence of lung [part of]: Secondary | ICD-10-CM | POA: Insufficient documentation

## 2022-02-13 DIAGNOSIS — K769 Liver disease, unspecified: Secondary | ICD-10-CM | POA: Insufficient documentation

## 2022-02-13 DIAGNOSIS — N200 Calculus of kidney: Secondary | ICD-10-CM | POA: Diagnosis not present

## 2022-02-13 LAB — CBC WITH DIFFERENTIAL (CANCER CENTER ONLY)
Abs Immature Granulocytes: 0 10*3/uL (ref 0.00–0.07)
Basophils Absolute: 0 10*3/uL (ref 0.0–0.1)
Basophils Relative: 1 %
Eosinophils Absolute: 0.1 10*3/uL (ref 0.0–0.5)
Eosinophils Relative: 2 %
HCT: 40.8 % (ref 36.0–46.0)
Hemoglobin: 13.5 g/dL (ref 12.0–15.0)
Immature Granulocytes: 0 %
Lymphocytes Relative: 32 %
Lymphs Abs: 1.6 10*3/uL (ref 0.7–4.0)
MCH: 29.4 pg (ref 26.0–34.0)
MCHC: 33.1 g/dL (ref 30.0–36.0)
MCV: 88.9 fL (ref 80.0–100.0)
Monocytes Absolute: 0.6 10*3/uL (ref 0.1–1.0)
Monocytes Relative: 12 %
Neutro Abs: 2.7 10*3/uL (ref 1.7–7.7)
Neutrophils Relative %: 53 %
Platelet Count: 160 10*3/uL (ref 150–400)
RBC: 4.59 MIL/uL (ref 3.87–5.11)
RDW: 13.5 % (ref 11.5–15.5)
WBC Count: 5 10*3/uL (ref 4.0–10.5)
nRBC: 0 % (ref 0.0–0.2)

## 2022-02-13 LAB — CMP (CANCER CENTER ONLY)
ALT: 22 U/L (ref 0–44)
AST: 24 U/L (ref 15–41)
Albumin: 3.9 g/dL (ref 3.5–5.0)
Alkaline Phosphatase: 133 U/L — ABNORMAL HIGH (ref 38–126)
Anion gap: 6 (ref 5–15)
BUN: 20 mg/dL (ref 6–20)
CO2: 27 mmol/L (ref 22–32)
Calcium: 9.1 mg/dL (ref 8.9–10.3)
Chloride: 107 mmol/L (ref 98–111)
Creatinine: 0.85 mg/dL (ref 0.44–1.00)
GFR, Estimated: >60 mL/min (ref >60)
Glucose, Bld: 81 mg/dL (ref 70–99)
Potassium: 3.9 mmol/L (ref 3.5–5.1)
Sodium: 140 mmol/L (ref 135–145)
Total Bilirubin: 0.7 mg/dL (ref 0.3–1.2)
Total Protein: 6.6 g/dL (ref 6.5–8.1)

## 2022-02-13 MED ORDER — IOHEXOL 300 MG/ML  SOLN
100.0000 mL | Freq: Once | INTRAMUSCULAR | Status: AC | PRN
  Administered 2022-02-13: 100 mL via INTRAVENOUS

## 2022-02-13 MED ORDER — SODIUM CHLORIDE (PF) 0.9 % IJ SOLN
INTRAMUSCULAR | Status: AC
  Filled 2022-02-13: qty 50

## 2022-02-14 DIAGNOSIS — Z124 Encounter for screening for malignant neoplasm of cervix: Secondary | ICD-10-CM | POA: Diagnosis not present

## 2022-02-14 DIAGNOSIS — Z01419 Encounter for gynecological examination (general) (routine) without abnormal findings: Secondary | ICD-10-CM | POA: Diagnosis not present

## 2022-02-14 DIAGNOSIS — Z6826 Body mass index (BMI) 26.0-26.9, adult: Secondary | ICD-10-CM | POA: Diagnosis not present

## 2022-02-15 ENCOUNTER — Inpatient Hospital Stay (HOSPITAL_BASED_OUTPATIENT_CLINIC_OR_DEPARTMENT_OTHER): Payer: BC Managed Care – PPO | Admitting: Internal Medicine

## 2022-02-15 VITALS — BP 126/98 | HR 83 | Temp 97.9°F | Resp 16 | Wt 167.2 lb

## 2022-02-15 DIAGNOSIS — C3490 Malignant neoplasm of unspecified part of unspecified bronchus or lung: Secondary | ICD-10-CM

## 2022-02-15 DIAGNOSIS — Z902 Acquired absence of lung [part of]: Secondary | ICD-10-CM | POA: Diagnosis not present

## 2022-02-15 DIAGNOSIS — K769 Liver disease, unspecified: Secondary | ICD-10-CM | POA: Diagnosis not present

## 2022-02-15 DIAGNOSIS — C3411 Malignant neoplasm of upper lobe, right bronchus or lung: Secondary | ICD-10-CM | POA: Diagnosis not present

## 2022-02-15 NOTE — Progress Notes (Signed)
[facility] [phone]   Fax:([phone]) [place], [place] [id number]  DIAGNOSIS: Stage IA (T1b, N0, M0) non-small cell lung cancer, adenocarcinoma presented with right upper lobe lung nodule.  The patient has a suspicious liver lesion seen on recent MRI of the liver at [facility].  Molecular studies showed positive ALK-EML4 gene translocation with negative PD-L1 expression.  PRIOR THERAPY: Status post right upper lobectomy with lymph node dissection under the care of Dr. Roxan Hockey on October 25, 2020.  CURRENT THERAPY: Observation.  INTERVAL HISTORY: Barbara Knight 57 y.o. female returns to the clinic today for follow-up visit.  The patient is feeling fine today with no concerning complaints.  She denied having any current chest pain, shortness of breath, cough or hemoptysis.  She had some few episodes of cough that is positional when she sleeps at nighttime.  She denied having any nausea, vomiting, diarrhea or constipation.  She has no headache or visual changes.  She had MRI of the abdomen performed at [facility] in October 2023 that was unremarkable.  She is here today for evaluation and repeat CT scan of the chest for restaging of her disease.   MEDICAL HISTORY: Past Medical History:  Diagnosis Date   Acquired dilation of ascending aorta and aortic root (HCC)    61mm ascending aorta by CTA and echo 03/2020 and 103mm sinus of valsalva on echo   Anemia    Bradycardia 12/16/2014   Elevated LFTs    hepatitis virus was negative   Family history of aortic aneurysm    grandfather had aortic aneurysm repair and paternal uncle died of ruptured aortic aneurysm   Headache(784.0)    migraines   Hepatitis    autoimmune   lung ca 10/25/2020   RU-lobectomy   Mitral valvular prolapse    posterior mitral vavlve leaflet with mild MR   Thyroid disease    Has a thyroid cyst     ALLERGIES:  has No Known Allergies.  MEDICATIONS:  Current Outpatient Medications  Medication Sig Dispense Refill   INTRAROSA 6.5 MG INST Place 1 tablet vaginally daily.     Multiple Vitamins-Iron (MULTI-VITAMIN/IRON) TABS Take 1 tablet by mouth daily.     mycophenolate (MYFORTIC) 360 MG TBEC EC tablet Take 720 mg by mouth 2 (two) times daily.     progesterone (PROMETRIUM) 100 MG capsule Take 100 mg by mouth daily.     ursodiol (ACTIGALL) 500 MG tablet Take 500 mg by mouth 2 (two) times daily.  3   No current facility-administered medications for this visit.    SURGICAL HISTORY:  Past Surgical History:  Procedure Laterality Date   BRONCHIAL BIOPSY  10/25/2020   Procedure: BRONCHIAL BIOPSIES;  Surgeon: Garner Nash, DO;  Location: [facility];  Service: Pulmonary;;   BRONCHIAL BRUSHINGS  10/25/2020   Procedure: BRONCHIAL BRUSHINGS;  Surgeon: Garner Nash, DO;  Location: [facility];  Service: Pulmonary;;   DILATATION & CURETTAGE/HYSTEROSCOPY WITH TRUECLEAR N/A 09/13/2012   Procedure: DILATATION & CURETTAGE/HYSTEROSCOPY WITH TRUECLEAR and THERMACHOICE;  Surgeon: Cyril Mourning, MD;  Location: [facility];  Service: Gynecology;  Laterality: N/A;   DILATATION & CURRETTAGE/HYSTEROSCOPY WITH RESECTOCOPE N/A 09/13/2012   Procedure: [place];  Surgeon: Cyril Mourning, MD;  Location: [facility];  Service: Gynecology;  Laterality: N/A;   DILATION AND CURETTAGE OF UTERUS     INTERCOSTAL NERVE BLOCK  Right 10/25/2020   Procedure: INTERCOSTAL NERVE BLOCK;  Surgeon: [name], MD;  Location: [facility];  Service: Thoracic;  Laterality: Right;   LIVER BIOPSY     x 2   LYMPH NODE DISSECTION Right 10/25/2020   Procedure: LYMPH NODE DISSECTION;  Surgeon: [name], MD;  Location: [place];  Service: Thoracic;  Laterality: Right;   SUBMUCOSAL TATTOO INJECTION  10/25/2020   Procedure: SUBMUCOSAL TATTOO INJECTION;  Surgeon: Garner Nash, DO;  Location: [facility];  Service: Pulmonary;;   UTERINE FIBROID SURGERY     VIDEO BRONCHOSCOPY WITH ENDOBRONCHIAL NAVIGATION Right 10/25/2020   Procedure: VIDEO BRONCHOSCOPY WITH ENDOBRONCHIAL NAVIGATION;  Surgeon: Garner Nash, DO;  Location: [place];  Service: Pulmonary;  Laterality: Right;  ION + Fiducial Dye Marking    REVIEW OF SYSTEMS:  A comprehensive review of systems was negative except for: Respiratory: positive for cough   PHYSICAL EXAMINATION: General appearance: alert, cooperative, and no distress Head: Normocephalic, without obvious abnormality, atraumatic Neck: no adenopathy, no JVD, supple, symmetrical, trachea midline, and thyroid not enlarged, symmetric, no tenderness/mass/nodules Lymph nodes: Cervical, supraclavicular, and axillary nodes normal. Resp: clear to auscultation bilaterally Back: symmetric, no curvature. ROM normal. No CVA tenderness. Cardio: regular rate and rhythm, S1, S2 normal, no murmur, click, rub or gallop GI: soft, non-tender; bowel sounds normal; no masses,  no organomegaly Extremities: extremities normal, atraumatic, no cyanosis or edema  ECOG PERFORMANCE STATUS: 1 - Symptomatic but completely ambulatory  Blood pressure (!) 126/98, pulse 83, temperature 97.9 F (36.6 C), temperature source Oral, resp. rate 16, weight 167 lb 3 oz (75.8 kg), last menstrual period 11/11/2014, SpO2 100 %.  LABORATORY DATA: Lab Results  Component Value Date   WBC 5.0 02/13/2022   HGB 13.5 02/13/2022   HCT 40.8 02/13/2022   MCV 88.9 02/13/2022   PLT 160 02/13/2022      Chemistry      Component Value Date/Time   NA 140 02/13/2022 1005   NA 141 04/07/2020 1542   K 3.9 02/13/2022 1005   CL 107 02/13/2022 1005   CO2 27 02/13/2022 1005   BUN 20 02/13/2022 1005   BUN 19 04/07/2020 1542   CREATININE 0.85 02/13/2022 1005   CREATININE 0.99 08/04/2015 0842      Component Value Date/Time   CALCIUM 9.1 02/13/2022 1005   ALKPHOS 133 (H) 02/13/2022  1005   AST 24 02/13/2022 1005   ALT 22 02/13/2022 1005   BILITOT 0.7 02/13/2022 1005       RADIOGRAPHIC STUDIES: CT Chest W Contrast  Result Date: 02/13/2022 CLINICAL DATA:  Non-small-cell lung cancer staging. Original stage I A adenocarcinoma right upper lobe. Suspicious liver lesion outside institution. * Tracking Code: BO * EXAM: CT CHEST, ABDOMEN, AND PELVIS WITH CONTRAST TECHNIQUE: Multidetector CT imaging of the chest, abdomen and pelvis was performed following the standard protocol during bolus administration of intravenous contrast. RADIATION DOSE REDUCTION: This exam was performed according to the departmental dose-optimization program which includes automated exposure control, adjustment of the mA and/or kV according to patient size and/or use of iterative reconstruction technique. CONTRAST:  171mL OMNIPAQUE IOHEXOL 300 MG/ML  SOLN COMPARISON:  08/12/2021 CT scan.  PET 12/01/2020 FINDINGS: CT CHEST FINDINGS Cardiovascular: Heart is nonenlarged. No significant pericardial effusion. There is pulsation artifact along the ascending aorta with slight atherosclerotic changes. Mediastinum/Nodes: Enlarged lobular thyroid gland particularly along the right side, unchanged from previous. Please correlate for history of a goiter. No specific abnormal lymph node enlargement  present in the axillary region, hilum or mediastinum. Exception is are some small nodes in the anterior cardiophrenic space. These are not pathologic by size criteria but more numerous than usually seen. Lungs/Pleura: Surgical changes seen in the right upper lobe. Associated scarring and fibrotic changes. No consolidation, pneumothorax or effusion. No recurrent lung nodule. Tiny nodule seen measuring 2 mm on the prior examination, today on series 505, image 98 measures 2 mm. By report stable since June 2022. Previous 3 mm nodule measuring 3 mm is stable today on image 55. Musculoskeletal: Mild degenerative changes along the spine. CT  ABDOMEN PELVIS FINDINGS Hepatobiliary: Nodular liver again identified. Please correlate for history of chronic liver disease. No intrinsic liver lesion. Portal vein is patent. Gallbladder is nondilated. Pancreas: Mild pancreatic atrophy.  No enhancing mass. Spleen: Spleen is nonenlarged. Adrenals/Urinary Tract: Adrenal glands are preserved. No enhancing renal mass or collecting system dilatation. There are some tiny low-attenuation foci in the left kidney which are stable from prior. The more medial small focus is consistent with cyst in the lateral is appearance of possible fat and an angiomyolipoma. Nonobstructing bilateral renal stones. The ureters have normal course and caliber down to the bladder. Stomach/Bowel: The large bowel is of normal course and caliber with scattered stool. Diffuse colonic stool. Normal appendix extends inferior to the cecum in the right lower quadrant. Stomach and small bowel are nondilated. Vascular/Lymphatic: Normal caliber aorta and IVC. No specific abnormal lymph node enlargement identified in the abdomen and pelvis. Reproductive: Uterus and bilateral adnexa are unremarkable. Other: Mild anasarca. No scattered ascites. No abdominal wall hernia. Musculoskeletal: Curvature of the spine with scattered degenerative changes. IMPRESSION: Stable changes of right upper lobectomy of the lung. No developing new mass lesion, fluid collection or lymph node enlargement. Chronic liver disease without significant splenomegaly, ascites. Patent portal vein. No clearly enhancing liver mass. However study was not performed as the dynamic examination. If prior films can be made available a direct comparison can be made to address the finding by outside study. In addition a dynamic liver MRI could be considered as clinically directed for further delineation. Nonobstructing renal stones. Electronically Signed   By: Jill Side M.D.   On: 02/13/2022 16:16   CT Abdomen Pelvis W Contrast  Result Date:  02/13/2022 CLINICAL DATA:  Non-small-cell lung cancer staging. Original stage I A adenocarcinoma right upper lobe. Suspicious liver lesion outside institution. * Tracking Code: BO * EXAM: CT CHEST, ABDOMEN, AND PELVIS WITH CONTRAST TECHNIQUE: Multidetector CT imaging of the chest, abdomen and pelvis was performed following the standard protocol during bolus administration of intravenous contrast. RADIATION DOSE REDUCTION: This exam was performed according to the departmental dose-optimization program which includes automated exposure control, adjustment of the mA and/or kV according to patient size and/or use of iterative reconstruction technique. CONTRAST:  122mL OMNIPAQUE IOHEXOL 300 MG/ML  SOLN COMPARISON:  08/12/2021 CT scan.  PET 12/01/2020 FINDINGS: CT CHEST FINDINGS Cardiovascular: Heart is nonenlarged. No significant pericardial effusion. There is pulsation artifact along the ascending aorta with slight atherosclerotic changes. Mediastinum/Nodes: Enlarged lobular thyroid gland particularly along the right side, unchanged from previous. Please correlate for history of a goiter. No specific abnormal lymph node enlargement present in the axillary region, hilum or mediastinum. Exception is are some small nodes in the anterior cardiophrenic space. These are not pathologic by size criteria but more numerous than usually seen. Lungs/Pleura: Surgical changes seen in the right upper lobe. Associated scarring and fibrotic changes. No consolidation, pneumothorax or effusion. No  recurrent lung nodule. Tiny nodule seen measuring 2 mm on the prior examination, today on series 505, image 98 measures 2 mm. By report stable since June 2022. Previous 3 mm nodule measuring 3 mm is stable today on image 55. Musculoskeletal: Mild degenerative changes along the spine. CT ABDOMEN PELVIS FINDINGS Hepatobiliary: Nodular liver again identified. Please correlate for history of chronic liver disease. No intrinsic liver lesion. Portal  vein is patent. Gallbladder is nondilated. Pancreas: Mild pancreatic atrophy.  No enhancing mass. Spleen: Spleen is nonenlarged. Adrenals/Urinary Tract: Adrenal glands are preserved. No enhancing renal mass or collecting system dilatation. There are some tiny low-attenuation foci in the left kidney which are stable from prior. The more medial small focus is consistent with cyst in the lateral is appearance of possible fat and an angiomyolipoma. Nonobstructing bilateral renal stones. The ureters have normal course and caliber down to the bladder. Stomach/Bowel: The large bowel is of normal course and caliber with scattered stool. Diffuse colonic stool. Normal appendix extends inferior to the cecum in the right lower quadrant. Stomach and small bowel are nondilated. Vascular/Lymphatic: Normal caliber aorta and IVC. No specific abnormal lymph node enlargement identified in the abdomen and pelvis. Reproductive: Uterus and bilateral adnexa are unremarkable. Other: Mild anasarca. No scattered ascites. No abdominal wall hernia. Musculoskeletal: Curvature of the spine with scattered degenerative changes. IMPRESSION: Stable changes of right upper lobectomy of the lung. No developing new mass lesion, fluid collection or lymph node enlargement. Chronic liver disease without significant splenomegaly, ascites. Patent portal vein. No clearly enhancing liver mass. However study was not performed as the dynamic examination. If prior films can be made available a direct comparison can be made to address the finding by outside study. In addition a dynamic liver MRI could be considered as clinically directed for further delineation. Nonobstructing renal stones. Electronically Signed   By: Jill Side M.D.   On: 02/13/2022 16:16    ASSESSMENT AND PLAN: This is a very pleasant 57 years old white female diagnosed with a stage IA (T1b, N0, M0) non-small cell lung cancer, adenocarcinoma presented with right upper lobe lung nodule status  post right upper lobectomy with lymph node dissection under the care of Dr. Roxan Hockey on October 25, 2020.  The patient had molecular studies that showed positive ALK-EML4 gene translocation and negative PD-L1 expression. The patient has been feeling fine with no concerning complaints except for the positional cough.  She had repeat CT scan of the chest performed recently.  I personally and independently reviewed the scan images and discussed the results with the patient today. Her scan showed no concerning findings for disease recurrence or metastasis. I recommended for her to continue on observation with repeat CT scan of the chest in 1 year. For the history of liver cirrhosis, she is followed at [facility] with routine imaging studies. She was advised to call immediately if she has any other concerning symptoms in the interval. The patient voices understanding of current disease status and treatment options and is in agreement with the current care plan.  All questions were answered. The patient knows to call the clinic with any problems, questions or concerns. We can certainly see the patient much sooner if necessary.  The total time spent in the appointment was 20 minutes.  Disclaimer: This note was dictated with voice recognition software. Similar sounding words can inadvertently be transcribed and may not be corrected upon review.

## 2022-03-02 ENCOUNTER — Telehealth: Payer: Self-pay | Admitting: Pulmonary Disease

## 2022-03-03 NOTE — Telephone Encounter (Signed)
Dr Valeta Harms   Please advise sir   Thank you   patient is scheduled for ct chest w on 02/13/2022 and we have ordered a ct chest wo for 04/2022 do we need to delay the 04/2022 one or still schedule it for Cire Clute

## 2022-03-06 NOTE — Telephone Encounter (Signed)
Routing to theresa as an Pharmacist, hospital.

## 2022-03-06 NOTE — Telephone Encounter (Signed)
Noted  

## 2022-03-17 DIAGNOSIS — D225 Melanocytic nevi of trunk: Secondary | ICD-10-CM | POA: Diagnosis not present

## 2022-03-17 DIAGNOSIS — D2361 Other benign neoplasm of skin of right upper limb, including shoulder: Secondary | ICD-10-CM | POA: Diagnosis not present

## 2022-03-17 DIAGNOSIS — L821 Other seborrheic keratosis: Secondary | ICD-10-CM | POA: Diagnosis not present

## 2022-03-17 DIAGNOSIS — D2371 Other benign neoplasm of skin of right lower limb, including hip: Secondary | ICD-10-CM | POA: Diagnosis not present

## 2022-03-19 IMAGING — CT NM PET TUM IMG INITIAL (PI) SKULL BASE T - THIGH
1 of 7 series · 1 of 25 positions shown · non-contrast
Comparison: Multiple exams, including 10/08/2020 chest CT

CLINICAL DATA: Initial treatment strategy for non-small cell lung
cancer. Right upper lobectomy.

EXAM:
NUCLEAR MEDICINE PET SKULL BASE TO THIGH
TECHNIQUE: 9.8 mCi F-18 FDG was injected intravenously. Full-ring PET imaging
was performed from the skull base to thigh after the radiotracer. CT
data was obtained and used for attenuation correction and anatomic
localization.
Fasting blood glucose: 97 mg/dl

[Series 4: ct sk_thigh 5.0 bf37 · axial · 5.0mm · 0.98mm/px · 1 of 222 slices shown]
[im 222/222  brain]
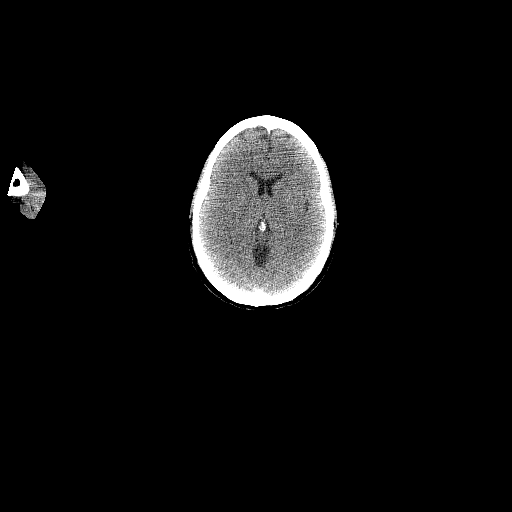

[1 of 25 positions shown; findings below may reference images not displayed]

FINDINGS: Mediastinal blood pool activity: SUV max

Liver activity: SUV max N/A

NECK: The right thyroid nodule measuring 3.6 2.8 cm is not
hypermetabolic and was previously biopsied on 06/04/2019. This has
been evaluated on previous imaging. (ref: [HOSPITAL]. [DATE]): 143-50).

Incidental CT findings: none

CHEST: Prior right upper lobectomy. Faint activity along the right
hilum with maximum SUV 3.8 is likely inflammatory/postoperative but
region merit surveillance. The previously seen very subtle
ground-glass density 5 mm retrohilar nodule is currently very
indistinct but suggested on image 31 series 8, without abnormal
metabolic activity.

Incidental CT findings: none

ABDOMEN/PELVIS: Cirrhosis without hypermetabolic activity in the
liver. Likely physiologic bowel activity along the splenic flexure.

Incidental CT findings: Nonobstructive 2 mm right mid upper kidney
calculus. 3 mm left kidney lower pole calculus. Right gastric
collateral vessels.

SKELETON: Mild activity along a right posterior ninth rib fracture,
maximum SUV 3.4. Activity in the left antecubital region is likely
injection related.

Incidental CT findings: Minimal levoconvex lumbar rotary scoliosis.
Mild grade 1 degenerative anterolisthesis at L4-5.
IMPRESSION: 1. Status post right upper lobectomy. There is some faint activity
along the right hilum which is likely postoperative, maximum SUV
3.8, but the region merit surveillance.
2. The very subtle 5 mm ground-glass density right lower lobe retro
hilar nodule is currently very indistinct and does not have
appreciable metabolic activity but is below sensitive PET-CT size
thresholds. Surveillance suggested.
3. Cirrhosis. No current hypermetabolic foci in the liver; on prior
CT angiography of the abdomen there is some enhancing foci in the
liver, and hepatic protocol MRI with and without contrast should be
considered for formal characterization of these lesions.
4. Bilateral nonobstructive nephrolithiasis.
5. Low-grade activity along a benign-appearing posterior right ninth
rib fracture.

## 2022-04-17 ENCOUNTER — Telehealth: Payer: Self-pay | Admitting: Pulmonary Disease

## 2022-04-17 NOTE — Telephone Encounter (Signed)
Set FU appt for Dr. Valeta Harms. Need CT w/o contrast. Pls call to sched. TY.

## 2022-04-19 NOTE — Telephone Encounter (Signed)
I see previous message stating to keep January 2024 CT and cancel April 2024 CT. Does this patient still need a follow up in May? When are they due for another CT Scan as there is not an order that I can see.

## 2022-04-21 NOTE — Telephone Encounter (Signed)
Spoke with patient yesteday- she is aware to follow up with Dr. Valeta Harms in May and will have a CT scheduled in January of 2025

## 2022-05-01 DIAGNOSIS — Z1231 Encounter for screening mammogram for malignant neoplasm of breast: Secondary | ICD-10-CM | POA: Diagnosis not present

## 2022-05-01 DIAGNOSIS — Z13820 Encounter for screening for osteoporosis: Secondary | ICD-10-CM | POA: Diagnosis not present

## 2022-05-25 DIAGNOSIS — K754 Autoimmune hepatitis: Secondary | ICD-10-CM | POA: Diagnosis not present

## 2022-05-25 DIAGNOSIS — K7689 Other specified diseases of liver: Secondary | ICD-10-CM | POA: Diagnosis not present

## 2022-05-25 DIAGNOSIS — D849 Immunodeficiency, unspecified: Secondary | ICD-10-CM | POA: Diagnosis not present

## 2022-06-02 ENCOUNTER — Ambulatory Visit: Payer: BC Managed Care – PPO | Admitting: Pulmonary Disease

## 2022-06-02 ENCOUNTER — Encounter: Payer: Self-pay | Admitting: Pulmonary Disease

## 2022-06-02 VITALS — BP 134/72 | HR 68 | Temp 98.2°F | Ht 67.0 in | Wt 165.4 lb

## 2022-06-02 DIAGNOSIS — Z902 Acquired absence of lung [part of]: Secondary | ICD-10-CM | POA: Diagnosis not present

## 2022-06-02 DIAGNOSIS — K7689 Other specified diseases of liver: Secondary | ICD-10-CM | POA: Diagnosis not present

## 2022-06-02 DIAGNOSIS — C3491 Malignant neoplasm of unspecified part of right bronchus or lung: Secondary | ICD-10-CM

## 2022-06-02 DIAGNOSIS — R918 Other nonspecific abnormal finding of lung field: Secondary | ICD-10-CM

## 2022-06-02 NOTE — Patient Instructions (Signed)
Thank you for visiting Dr. Travonta Gill at [facility]. Today we recommend the following:  Return if symptoms worsen or fail to improve.    Please do your part to reduce the spread of COVID-19.  

## 2022-06-02 NOTE — Progress Notes (Signed)
Synopsis: Referred in September 2022 for lung nodule, Dr. 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 He She follows with Dr. Marchelle Gearing in pulmonary clinic was diagnosed with primary biliary cirrhosis followed at [facility].  She had COVID in January 2022.  She had a small groundglass opacity found on CT imaging her mother was diagnosed with metastatic adenocarcinoma.  She had an initial CT scan of the chest in June 2022 which revealed a subsolid nodular lesion within the apex of the right upper lobe.  We made a decision to follow this up short-term.  In September 2022.  This CT scan was completed on 10/08/2020 which showed interval enlargement of the subsolid lesion now measuring 14 x 9 mm.  Patient was referred today to discuss options regarding lung nodule and next steps.  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 Dorris Fetch.  Patient's postoperative course has been complicated by neuropathic pain treated with Lyrica.  Also having some ongoing shortness of breath.  Molecular testing of the tumor was EGFR negative but ALK positive.  Mother history with EGFR positive metastatic lung cancer.  She was seen in the emergency department in December for shortness of breath treated with a course of steroids and new inhaler regimen.  Last seen in the office by thoracic surgery on 01/04/2021 office note reviewed.  Treated with a course of antibiotics and bronchodilators.  Here today for follow-up.  OV 06/01/2021: Here today for follow-up after recent CT scan of the chest.  She also  had pulmonary function test completed prior to today's office visit.  Reviewed these today in the office she has a normal spirometry normal total lung capacity normal DLCO.  Her HRCT which was complete recently has no evidence of sarcoidosis.  Even though she had nodal disease that had some sarcoid-like reaction.  She also has no evidence of recurrence of malignancy.  She has a small new lung nodule within the right middle lobe 3 mm in size.  We talked today about her imaging for of the liver that was completed at [facility].  She has some work-up pending for this.  OV 06/02/2022: Here today for follow-up.  We reviewed her CT imaging.  No evidence of recurrence of disease.  She still having some pain at the site where she had a rib fracture following surgery.  But otherwise she is functionally doing very well.  No issues from respiratory standpoint.       Past Medical History:  Diagnosis Date   Acquired dilation of ascending aorta and aortic root (HCC)    38mm ascending aorta by CTA and echo 03/2020 and 40mm sinus of valsalva on echo   Anemia    Bradycardia 12/16/2014   Elevated LFTs    hepatitis virus was negative   Family history of aortic aneurysm    grandfather had aortic aneurysm repair and paternal uncle died of ruptured aortic aneurysm   Headache(784.0)    migraines   Hepatitis    autoimmune   lung ca 10/25/2020   RU-lobectomy   Mitral valvular prolapse    posterior mitral vavlve leaflet  with mild MR   Thyroid disease    Has a thyroid cyst     Family History  Problem Relation Age of Onset   Lung cancer Mother    Barrett's esophagus Mother    Hypertension Father    Aortic aneurysm Paternal Grandfather        s/p repair   Aortic aneurysm Paternal Uncle        died of ruptured aortic aneurysm   Colon cancer Neg Hx    Colon polyps Neg Hx    Esophageal cancer Neg Hx    Rectal cancer Neg Hx    Stomach cancer Neg Hx      Past Surgical History:  Procedure Laterality Date    BRONCHIAL BIOPSY  10/25/2020   Procedure: BRONCHIAL BIOPSIES;  Surgeon: Josephine Igo, DO;  Location: [facility];  Service: Pulmonary;;   BRONCHIAL BRUSHINGS  10/25/2020   Procedure: BRONCHIAL BRUSHINGS;  Surgeon: Josephine Igo, DO;  Location: [facility];  Service: Pulmonary;;   DILATATION & CURETTAGE/HYSTEROSCOPY WITH TRUECLEAR N/A 09/13/2012   Procedure: DILATATION & CURETTAGE/HYSTEROSCOPY WITH TRUECLEAR and THERMACHOICE;  Surgeon: Jeani Hawking, MD;  Location: [facility];  Service: Gynecology;  Laterality: N/A;   DILATATION & CURRETTAGE/HYSTEROSCOPY WITH RESECTOCOPE N/A 09/13/2012   Procedure: DILATATION & CURETTAGE/HYSTEROSCOPY WITH RESECTOCOPE;  Surgeon: Jeani Hawking, MD;  Location: [facility];  Service: Gynecology;  Laterality: N/A;   DILATION AND CURETTAGE OF UTERUS     INTERCOSTAL NERVE BLOCK Right 10/25/2020   Procedure: INTERCOSTAL NERVE BLOCK;  Surgeon: Loreli Slot, MD;  Location: [facility] OR;  Service: Thoracic;  Laterality: Right;   LIVER BIOPSY     x 2   LYMPH NODE DISSECTION Right 10/25/2020   Procedure: LYMPH NODE DISSECTION;  Surgeon: Loreli Slot, MD;  Location: [facility] OR;  Service: Thoracic;  Laterality: Right;   SUBMUCOSAL TATTOO INJECTION  10/25/2020   Procedure: SUBMUCOSAL TATTOO INJECTION;  Surgeon: Josephine Igo, DO;  Location: [facility];  Service: Pulmonary;;   UTERINE FIBROID SURGERY     VIDEO BRONCHOSCOPY WITH ENDOBRONCHIAL NAVIGATION Right 10/25/2020   Procedure: VIDEO BRONCHOSCOPY WITH ENDOBRONCHIAL NAVIGATION;  Surgeon: Josephine Igo, DO;  Location: [facility];  Service: Pulmonary;  Laterality: Right;  ION + Fiducial Dye Marking    Social History   Socioeconomic History   Marital status: Married    Spouse name: Not on file   Number of children: Not on file   Years of education: Not on file   Highest education level: Not on file  Occupational History   Not on file  Tobacco Use   Smoking status: Never   Smokeless tobacco: Never   Vaping Use   Vaping Use: Never used  Substance and Sexual Activity   Alcohol use: Not Currently    Comment: stopped 10/21   Drug use: No   Sexual activity: Yes  Other Topics Concern   Not on file  Social History Narrative   Not on file   Social Determinants of Health   Financial Resource Strain: Not on file  Food Insecurity: Not on file  Transportation Needs: Not on file  Physical Activity: Not on file  Stress: Not on file  Social Connections: Not on file  Intimate Partner Violence: Not on file     No Known Allergies   Outpatient Medications Prior to Visit  Medication Sig Dispense Refill   INTRAROSA 6.5 MG INST Place 1 tablet vaginally daily.     Multiple Vitamins-Iron (MULTI-VITAMIN/IRON) TABS Take 1  tablet by mouth daily.     mycophenolate (MYFORTIC) 360 MG TBEC EC tablet Take 720 mg by mouth 2 (two) times daily.     progesterone (PROMETRIUM) 100 MG capsule Take 100 mg by mouth daily.     ursodiol (ACTIGALL) 500 MG tablet Take 500 mg by mouth 2 (two) times daily.  3   No facility-administered medications prior to visit.    Review of Systems  Constitutional:  Negative for chills, fever, malaise/fatigue and weight loss.  HENT:  Negative for hearing loss, sore throat and tinnitus.   Eyes:  Negative for blurred vision and double vision.  Respiratory:  Negative for cough, hemoptysis, sputum production, shortness of breath, wheezing and stridor.   Cardiovascular:  Negative for chest pain, palpitations, orthopnea, leg swelling and PND.  Gastrointestinal:  Negative for abdominal pain, constipation, diarrhea, heartburn, nausea and vomiting.  Genitourinary:  Negative for dysuria, hematuria and urgency.  Musculoskeletal:  Negative for joint pain and myalgias.  Skin:  Negative for itching and rash.  Neurological:  Negative for dizziness, tingling, weakness and headaches.  Endo/Heme/Allergies:  Negative for environmental allergies. Does not bruise/bleed easily.   Psychiatric/Behavioral:  Negative for depression. The patient is not nervous/anxious and does not have insomnia.   All other systems reviewed and are negative.    Objective:  Physical Exam Vitals reviewed.  Constitutional:      General: She is not in acute distress.    Appearance: She is well-developed.  HENT:     Head: Normocephalic and atraumatic.  Eyes:     General: No scleral icterus.    Conjunctiva/sclera: Conjunctivae normal.     Pupils: Pupils are equal, round, and reactive to light.  Neck:     Vascular: No JVD.     Trachea: No tracheal deviation.  Cardiovascular:     Rate and Rhythm: Normal rate and regular rhythm.     Heart sounds: Normal heart sounds. No murmur heard. Pulmonary:     Effort: Pulmonary effort is normal. No tachypnea, accessory muscle usage or respiratory distress.     Breath sounds: No stridor. No wheezing, rhonchi or rales.  Abdominal:     General: There is no distension.     Palpations: Abdomen is soft.     Tenderness: There is no abdominal tenderness.  Musculoskeletal:        General: No tenderness.     Cervical back: Neck supple.  Lymphadenopathy:     Cervical: No cervical adenopathy.  Skin:    General: Skin is warm and dry.     Capillary Refill: Capillary refill takes less than 2 seconds.     Findings: No rash.  Neurological:     Mental Status: She is alert and oriented to person, place, and time.  Psychiatric:        Behavior: Behavior normal.      Vitals:   06/02/22 1049  BP: 134/72  Pulse: 68  Temp: 98.2 F (36.8 C)  SpO2: 98%  Weight: 165 lb 6.4 oz (75 kg)  Height: 5\' 7"  (1.702 m)   98% on RA BMI Readings from Last 3 Encounters:  06/02/22 25.91 kg/m  02/15/22 26.19 kg/m  08/15/21 25.62 kg/m   Wt Readings from Last 3 Encounters:  06/02/22 165 lb 6.4 oz (75 kg)  02/15/22 167 lb 3 oz (75.8 kg)  08/15/21 163 lb 9.6 oz (74.2 kg)     CBC    Component Value Date/Time   WBC 5.0 02/13/2022 1005   WBC 4.5  12/24/2020 2233   RBC 4.59 02/13/2022 1005   HGB 13.5 02/13/2022 1005   HGB 13.5 04/07/2020 1542   HCT 40.8 02/13/2022 1005   HCT 40.4 04/07/2020 1542   PLT 160 02/13/2022 1005   PLT 173 04/07/2020 1542   MCV 88.9 02/13/2022 1005   MCV 89 04/07/2020 1542   MCH 29.4 02/13/2022 1005   MCHC 33.1 02/13/2022 1005   RDW 13.5 02/13/2022 1005   RDW 13.3 04/07/2020 1542   LYMPHSABS 1.6 02/13/2022 1005   MONOABS 0.6 02/13/2022 1005   EOSABS 0.1 02/13/2022 1005   BASOSABS 0.0 02/13/2022 1005     Chest Imaging:  10/08/2020 CT chest: Patient had a follow-up CT imaging from her June images which are about reveals a slight enlargement in the subsolid component right upper lobe nodule. We reviewed these images today in the office. The patient's images have been independently reviewed by me.    OV 06/01/2021 HRCT chest: No evidence of sarcoid changes no evidence of ILD.  Tiny 0.3 cm central right middle lobe pulmonary nodule.  Nuclear medicine pet imaging scheduled for 2-month follow-up for the lung nodule to be complete by Dr. Arbutus Ped. The patient's images have been independently reviewed by me.    CT imaging January 2024: No evidence recurrence of disease. The patient's images have been independently reviewed by me.    Pulmonary Functions Testing Results:    Latest Ref Rng & Units 06/01/2021   10:09 AM 10/18/2020    8:57 AM  PFT Results  FVC-Pre L 4.14  4.08   FVC-Predicted Pre % 110  107   FVC-Post L 4.03  3.95   FVC-Predicted Post % 107  104   Pre FEV1/FVC % % 76  81   Post FEV1/FCV % % 77  83   FEV1-Pre L 3.15  3.30   FEV1-Predicted Pre % 107  111   FEV1-Post L 3.10  3.27   DLCO uncorrected ml/min/mmHg 21.77  20.65   DLCO UNC% % 96  90   DLCO corrected ml/min/mmHg 21.90    DLCO COR %Predicted % 96    DLVA Predicted % 93  88   TLC L 6.04  6.08   TLC % Predicted % 109  110   RV % Predicted % 96  92     FeNO:   Pathology:   Echocardiogram:   Heart Catheterization:      Assessment & Plan:     ICD-10-CM   1. Adenocarcinoma of right lung, stage 1 (HCC)  C34.91     2. 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 Ground glass opacity present on imaging of lung  R91.8       Discussion:  This is a 57 year old female here today for follow-up after a prior diagnosis of a stage I adenocarcinoma status post robotic assisted bronchoscopy dye marking and robotic assisted thoracic surgery for lobectomy.  Patient is doing well.  Respiratory standpoint no complaints.  She did have postoperative diagnosis lymph nodes contain noncaseating granulomas.  Recent CT evidence of the chest no evidence of sarcoid like reaction of within the lung parenchyma. We followed up her CT imaging now repeat in January which was stable.  She has a repeat noncontrast scan in 1 year.  Plan: No follow-up with me as needed. She can let us know how she is doing.  I am happy to review her scans in the future as  well. Follow-up with oncology as scheduled.    Current Outpatient Medications:    INTRAROSA 6.5 MG INST, Place 1 tablet vaginally daily., Disp: , Rfl:    Multiple Vitamins-Iron (MULTI-VITAMIN/IRON) TABS, Take 1 tablet by mouth daily., Disp: , Rfl:    mycophenolate (MYFORTIC) 360 MG TBEC EC tablet, Take 720 mg by mouth 2 (two) times daily., Disp: , Rfl:    progesterone (PROMETRIUM) 100 MG capsule, Take 100 mg by mouth daily., Disp: , Rfl:    ursodiol (ACTIGALL) 500 MG tablet, Take 500 mg by mouth 2 (two) times daily., Disp: , Rfl: 3   Josephine Igo, DO [facility] 06/02/2022 5:31 PM

## 2022-07-17 ENCOUNTER — Other Ambulatory Visit: Payer: BC Managed Care – PPO

## 2022-07-19 ENCOUNTER — Other Ambulatory Visit: Payer: Self-pay | Admitting: Surgery

## 2022-07-19 DIAGNOSIS — E041 Nontoxic single thyroid nodule: Secondary | ICD-10-CM

## 2022-07-19 DIAGNOSIS — Z8639 Personal history of other endocrine, nutritional and metabolic disease: Secondary | ICD-10-CM | POA: Diagnosis not present

## 2022-08-08 ENCOUNTER — Ambulatory Visit
Admission: RE | Admit: 2022-08-08 | Discharge: 2022-08-08 | Disposition: A | Discharge status: Home / Self Care | Payer: BC Managed Care – PPO | Source: Ambulatory Visit | Attending: Surgery | Admitting: Surgery

## 2022-08-08 DIAGNOSIS — E041 Nontoxic single thyroid nodule: Secondary | ICD-10-CM

## 2022-08-08 DIAGNOSIS — E042 Nontoxic multinodular goiter: Secondary | ICD-10-CM | POA: Diagnosis not present

## 2022-08-10 DIAGNOSIS — K7460 Unspecified cirrhosis of liver: Secondary | ICD-10-CM | POA: Diagnosis not present

## 2022-08-10 DIAGNOSIS — K7689 Other specified diseases of liver: Secondary | ICD-10-CM | POA: Diagnosis not present

## 2022-08-10 DIAGNOSIS — I8510 Secondary esophageal varices without bleeding: Secondary | ICD-10-CM | POA: Diagnosis not present

## 2022-08-14 NOTE — Progress Notes (Signed)
USN shows mild enlargement of the dominant nodule on the right.  It is partially solid and partially cystic.  No other significant nodules are identified.  Small cysts in left lobe.  Would recommend proceeding with right thyroid lobectomy for removal of the dominant nodule and relief of compressive symptoms.  I will contact patient and submit orders for scheduling of surgery as we discussed in the office.  Darnell Level, MD [facility] practice Office: [phone]

## 2022-08-16 ENCOUNTER — Ambulatory Visit: Payer: Self-pay | Admitting: Surgery

## 2022-08-22 DIAGNOSIS — K754 Autoimmune hepatitis: Secondary | ICD-10-CM | POA: Diagnosis not present

## 2022-08-22 DIAGNOSIS — E041 Nontoxic single thyroid nodule: Secondary | ICD-10-CM | POA: Diagnosis not present

## 2022-08-22 DIAGNOSIS — D849 Immunodeficiency, unspecified: Secondary | ICD-10-CM | POA: Diagnosis not present

## 2022-09-24 ENCOUNTER — Encounter (HOSPITAL_COMMUNITY): Payer: Self-pay | Admitting: Surgery

## 2022-09-24 DIAGNOSIS — E041 Nontoxic single thyroid nodule: Secondary | ICD-10-CM | POA: Diagnosis present

## 2022-09-24 NOTE — H&P (Signed)
PROVIDER: Ariany Kesselman Myra Rude, MD   Chief Complaint: New Consultation (Thyroid cyst)  History of Present Illness:  Patient is referred by her husband, Dr. Altamease Oiler, for surgical evaluation and recommendations regarding a right-sided thyroid mass. 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 She underwent aspiration by Dr. Osborn Coho from ENT and by Dr. Irish Lack from radiology. Patient has not had recent imaging. Thyroid function has been normal in the past but she has not had a recent TSH level. Patient noted after each aspiration that the mass did not completely disappear. She is now begun having more compressive symptoms including occasional dysphagia, positional discomfort, and globus sensation. She does note a frequent cough. Patient has had no prior head or neck surgery. She has never been on thyroid medication. There is no family history of thyroid disease.  Patient does have a personal history of adenocarcinoma of the lung and a non-smoker. She has undergone successful treatment and appears to be disease-free.  Review of Systems: A complete review of systems was obtained from the patient. I have reviewed this information and discussed as appropriate with the patient. See HPI as well for other ROS.  Review of Systems  Constitutional: Negative.  HENT:  Globus sensation  Eyes: Negative.  Respiratory: Positive for cough.  Cardiovascular: Negative.  Gastrointestinal:  Occasional dysphagia  Genitourinary: Negative.  Musculoskeletal: Negative.  Skin: Negative.  Neurological: Negative. Negative for tremors.  Endo/Heme/Allergies: Negative.  Psychiatric/Behavioral: Negative.    Medical History: Past Medical History:  Diagnosis Date  Autoimmune hepatitis (CMS/HHS-HCC)  Migraines  Thyroid cyst   Patient Active Problem List  Diagnosis  Autoimmune hepatitis (CMS/HHS-HCC)  Immunosuppression (CMS/HHS-HCC)  Liver mass   Right thyroid nodule  History of thyroid cyst   Past Surgical History:  Procedure Laterality Date  EGD N/A 10/19/2020  Procedure: EGD - Upper Endoscopy; Surgeon: Royetta Car, MD; Location: [facility]; Service: Gastroenterology; Laterality: N/A;  TRANSCERVICAL UTERINE FIBROID(S) ABLATION Bilateral    No Known Allergies  Current Outpatient Medications on File Prior to Visit  Medication Sig Dispense Refill  mycophenolate (MYFORTIC) 360 MG EC tablet TAKE 2 TABLETS BY MOUTH EVERY TWELVE HOURS 120 tablet 11  progesterone (PROMETRIUM) 100 MG capsule Take 100 mg by mouth once daily  ursodiol (URSO FORTE ORAL) Urso  mycophenolate (MYFORTIC) 360 MG EC tablet Take by mouth  naproxen sodium (ALEVE ORAL) Take by mouth  prasterone, dhea, (INTRAROSA) 6.5 mg Inst Intrarosa 6.5 mg vaginal insert INSERT 1 TABLET VAGINALLY EVERY DAY  tretinoin (RETIN-A) 0.1 % cream Apply topically nightly  ursodioL (URSO FORTE) 500 MG tablet TAKE 1 TABLET BY MOUTH 2 TIMES DAILY 180 tablet 3   No current facility-administered medications on file prior to visit.   Family History  Problem Relation Age of Onset  High blood pressure (Hypertension) Father  Hyperlipidemia (Elevated cholesterol) Father  Liver disease Neg Hx  Liver cancer Neg Hx  Cirrhosis Neg Hx    Social History   Tobacco Use  Smoking Status Never  Smokeless Tobacco Never    Social History   Socioeconomic History  Marital status: Married  Spouse name: Mardelle Matte  Number of children: 4  Tobacco Use  Smoking status: Never  Smokeless tobacco: Never  Vaping Use  Vaping status: Never Used  Substance and Sexual Activity  Alcohol use: Not Currently  Comment: 2/month  Drug use: Never  Sexual activity: Yes  Partners: Male   Objective:  Vitals:  BP: 112/64  Pulse: 105  Temp: 36.8 C (98.3 F)  SpO2: 98%  Weight: 75.8 kg (167 lb 3.2 oz)  Height: 170.2 cm (5\' 7" )  PainSc: 0-No pain   Body mass index is 26.19  kg/m.  Physical Exam   GENERAL APPEARANCE Comfortable, no acute issues Development: normal Gross deformities: none  SKIN Rash, lesions, ulcers: none Induration, erythema: none Nodules: none palpable  EYES Conjunctiva and lids: normal Pupils: equal and reactive  EARS, NOSE, MOUTH, THROAT External ears: no lesion or deformity External nose: no lesion or deformity Hearing: grossly normal  NECK Symmetric: no Trachea: midline Thyroid: Right thyroid lobe is visibly enlarged. It is relatively smooth. It is moderately firm to palpation. Left thyroid lobe is without palpable abnormality. There is no associated lymphadenopathy.  ABDOMEN Not assessed  GENITOURINARY/RECTAL Not assessed  MUSCULOSKELETAL Station and gait: normal Digits and nails: no clubbing or cyanosis Muscle strength: grossly normal all extremities Range of motion: grossly normal all extremities Deformity: none  LYMPHATIC Cervical: none palpable Supraclavicular: none palpable  PSYCHIATRIC Oriented to person, place, and time: yes Mood and affect: normal for situation Judgment and insight: appropriate for situation   Assessment and Plan:   Right thyroid nodule History of thyroid cyst  Patient is referred by her husband for surgical evaluation and recommendations regarding a recurrent right thyroid cystic mass.  Patient provided with a copy of "The Thyroid Book: Medical and Surgical Treatment of Thyroid Problems", published by [employer], 16 pages. Book reviewed and explained to patient during visit today.  Today we reviewed her clinical history. We discussed her prior aspiration procedures. We discussed the development of compressive symptoms. I would like to obtain an ultrasound examination of the thyroid as well as a TSH level. If it appears this is a solitary lesion in the right thyroid lobe and it is not a simple cystic fluid collection, then I would recommend proceeding with right thyroid lobectomy for  definitive diagnosis and for relief of developing compressive symptoms. Today we discussed the procedure. We discussed risk and benefits including recurrent laryngeal nerve injury and injury to parathyroid glands. We discussed the size and location of the surgical incision. We discussed the hospital stay and postoperative recovery. We discussed the possibility of lifelong thyroid hormone supplementation. We discussed the possibility of additional surgery or radioactive iodine treatment. The patient understands and agrees to proceed with further imaging and laboratory studies followed by a discussion of the results.  Patient will undergo ultrasound and TSH level. I will contact her with those results and we will make a plan for further management at that time.  Darnell Level, MD [facility] practice Office: [phone]

## 2022-09-27 NOTE — Patient Instructions (Signed)
SURGICAL WAITING ROOM VISITATION  Patients having surgery or a procedure may have no more than 2 support people in the waiting area - these visitors may rotate.    Children under the age of 45 must have an adult with them who is not the patient.  Due to an increase in RSV and influenza rates and associated hospitalizations, children ages 33 and under may not visit patients in [facility] hospitals.  If the patient needs to stay at the hospital during part of their recovery, the visitor guidelines for inpatient rooms apply. Pre-op nurse will coordinate an appropriate time for 1 support person to accompany patient in pre-op.  This support person may not rotate.    Please refer to the [facility] website for the visitor guidelines for Inpatients (after your surgery is over and you are in a regular room).       Your procedure is scheduled on: 10/02/22   Report to [facility] Main Entrance    Report to admitting at 7:15 AM   Call this number if you have problems the morning of surgery [phone]   Do not eat food :After Midnight.   After Midnight you may have the following liquids until 6:30 AM DAY OF SURGERY  Water Non-Citrus Juices (without pulp, NO RED-Apple, White grape, White cranberry) Black Coffee (NO MILK/CREAM OR CREAMERS, sugar ok)  Clear Tea (NO MILK/CREAM OR CREAMERS, sugar ok) regular and decaf                             Plain Jell-O (NO RED)                                           Fruit ices (not with fruit pulp, NO RED)                                     Popsicles (NO RED)                                                               Sports drinks like Gatorade (NO RED)                   Oral Hygiene is also important to reduce your risk of infection.                                    Remember - BRUSH YOUR TEETH THE MORNING OF SURGERY WITH YOUR REGULAR TOOTHPASTE   Stop all vitamins and herbal supplements 7 days before surgery.   Take these medicines  the morning of surgery: Myfortic, Actigall  DO NOT TAKE ANY ORAL DIABETIC MEDICATIONS DAY OF YOUR SURGERY             You may not have any metal on your body including hair pins, jewelry, and body piercing             Do not wear make-up, lotions, powders, perfumes/cologne, or deodorant  Do not  wear nail polish including gel and S&S, artificial/acrylic nails, or any other type of covering on natural nails including finger and toenails. If you have artificial nails, gel coating, etc. that needs to be removed by a nail salon please have this removed prior to surgery or surgery may need to be canceled/ delayed if the surgeon/ anesthesia feels like they are unable to be safely monitored.   Do not shave  48 hours prior to surgery.    Do not bring valuables to the hospital. [facility] IS NOT             RESPONSIBLE   FOR VALUABLES.   Contacts, glasses, dentures or bridgework may not be worn into surgery.   Bring small overnight bag day of surgery.   DO NOT BRING YOUR HOME MEDICATIONS TO THE HOSPITAL. PHARMACY WILL DISPENSE MEDICATIONS LISTED ON YOUR MEDICATION LIST TO YOU DURING YOUR ADMISSION IN THE HOSPITAL!    Patients discharged on the day of surgery will not be allowed to drive home.  Someone NEEDS to stay with you for the first 24 hours after anesthesia.   Special Instructions: Bring a copy of your healthcare power of attorney and living will documents the day of surgery if you haven't scanned them before.              Please read over the following fact sheets you were given: IF YOU HAVE QUESTIONS ABOUT YOUR PRE-OP INSTRUCTIONS PLEASE CALL [phone] Barbara Knight   If you received a COVID test during your pre-op visit  it is requested that you wear a mask when out in public, stay away from anyone that may not be feeling well and notify your surgeon if you develop symptoms. If you test positive for Covid or have been in contact with anyone that has tested positive in the last 10 days please  notify you surgeon.    [place] [facility] - Preparing for Surgery Before surgery, you can play an important role.  Because skin is not sterile, your skin needs to be as free of germs as possible.  You can reduce the number of germs on your skin by washing with CHG (chlorahexidine gluconate) soap before surgery.  CHG is an antiseptic cleaner which kills germs and bonds with the skin to continue killing germs even after washing. Please DO NOT use if you have an allergy to CHG or antibacterial soaps.  If your skin becomes reddened/irritated stop using the CHG and inform your nurse when you arrive at Short Stay. Do not shave (including legs and underarms) for at least 48 hours prior to the first CHG shower.  You may shave your face/neck.  Please follow these instructions carefully:  1.  Shower with CHG Soap the night before surgery and the  morning of surgery.  2.  If you choose to wash your hair, wash your hair first as usual with your normal  shampoo.  3.  After you shampoo, rinse your hair and body thoroughly to remove the shampoo.                             4.  Use CHG as you would any other liquid soap.  You can apply chg directly to the skin and wash.  Gently with a scrungie or clean washcloth.  5.  Apply the CHG Soap to your body ONLY FROM THE NECK DOWN.   Do   not use on face/ open  Wound or open sores. Avoid contact with eyes, ears mouth and   genitals (private parts).                       Wash face,  Genitals (private parts) with your normal soap.             6.  Wash thoroughly, paying special attention to the area where your    surgery  will be performed.  7.  Thoroughly rinse your body with warm water from the neck down.  8.  DO NOT shower/wash with your normal soap after using and rinsing off the CHG Soap.                9.  Pat yourself dry with a clean towel.            10.  Wear clean pajamas.            11.  Place clean sheets on your bed the night of your first  shower and do not  sleep with pets. Day of Surgery : Do not apply any lotions/deodorants the morning of surgery.  Please wear clean clothes to the hospital/surgery center.  FAILURE TO FOLLOW THESE INSTRUCTIONS MAY RESULT IN THE CANCELLATION OF YOUR SURGERY  PATIENT SIGNATURE_________________________________  NURSE SIGNATURE__________________________________  ________________________________________________________________________

## 2022-09-27 NOTE — Progress Notes (Signed)
COVID Vaccine received:  []  No [x]  Yes Date of any COVID positive Test in last 90 days: No PCP - Marcelle Overlie MD Cardiologist - Merlene Pulling MD  Chest x-ray - 02/13/22 Epic EKG -   Stress Test -  ECHO - 03/25/21 Epic Cardiac Cath -   Bowel Prep - [x]  No  []   Yes ______  Pacemaker / ICD device [x]  No []  Yes   Spinal Cord Stimulator:[x]  No []  Yes       History of Sleep Apnea? [x]  No []  Yes   CPAP used?- [x]  No []  Yes    Does the patient monitor blood sugar?          [x]  No []  Yes  []  N/A  Patient has: [x]  NO Hx DM   []  Pre-DM                 []  DM1  []   DM2 Does patient have a [employer] or Dexacom? []  No []  Yes   Fasting Blood Sugar Ranges-  Checks Blood Sugar _____ times a day  GLP1 agonist / usual dose - no GLP1 instructions:  SGLT-2 inhibitors / usual dose - no SGLT-2 instructions:   Blood Thinner / Instructions:no Aspirin Instructions:no  Comments:   Activity level: Patient is ableto climb a flight of stairs without difficulty; [x]  No CP  [x]  No SOB, but would have ___   Patient can  perform ADLs without assistance.   Anesthesia review:   Patient denies shortness of breath, fever, cough and chest pain at PAT appointment.  Patient verbalized understanding and agreement to the Pre-Surgical Instructions that were given to them at this PAT appointment. Patient was also educated of the need to review these PAT instructions again prior to his/her surgery.I reviewed the appropriate phone numbers to call if they have any and questions or concerns.

## 2022-09-28 ENCOUNTER — Encounter (HOSPITAL_COMMUNITY)
Admission: RE | Admit: 2022-09-28 | Discharge: 2022-09-28 | Disposition: A | Discharge status: Home / Self Care | Payer: BC Managed Care – PPO | Source: Ambulatory Visit | Attending: Surgery | Admitting: Surgery

## 2022-09-28 ENCOUNTER — Encounter (HOSPITAL_COMMUNITY): Payer: Self-pay

## 2022-09-28 ENCOUNTER — Other Ambulatory Visit: Payer: Self-pay

## 2022-09-28 VITALS — BP 131/93 | HR 64 | Temp 98.1°F | Resp 16 | Ht 67.0 in | Wt 167.0 lb

## 2022-09-28 DIAGNOSIS — Z01818 Encounter for other preprocedural examination: Secondary | ICD-10-CM | POA: Insufficient documentation

## 2022-09-28 HISTORY — DX: Personal history of urinary calculi: Z87.442

## 2022-09-28 LAB — CBC
HCT: 42.7 % (ref 36.0–46.0)
Hemoglobin: 13.8 g/dL (ref 12.0–15.0)
MCH: 28.9 pg (ref 26.0–34.0)
MCHC: 32.3 g/dL (ref 30.0–36.0)
MCV: 89.3 fL (ref 80.0–100.0)
Platelets: 141 10*3/uL — ABNORMAL LOW (ref 150–400)
RBC: 4.78 MIL/uL (ref 3.87–5.11)
RDW: 13.9 % (ref 11.5–15.5)
WBC: 4.9 10*3/uL (ref 4.0–10.5)
nRBC: 0 % (ref 0.0–0.2)

## 2022-10-01 ENCOUNTER — Encounter (HOSPITAL_COMMUNITY): Payer: Self-pay | Admitting: Surgery

## 2022-10-02 ENCOUNTER — Other Ambulatory Visit: Payer: Self-pay

## 2022-10-02 ENCOUNTER — Ambulatory Visit (HOSPITAL_COMMUNITY)
Admission: RE | Admit: 2022-10-02 | Discharge: 2022-10-03 | Disposition: A | Discharge status: Home / Self Care | Payer: BC Managed Care – PPO | Attending: Surgery | Admitting: Surgery

## 2022-10-02 ENCOUNTER — Encounter (HOSPITAL_COMMUNITY)
Admission: RE | Disposition: A | Discharge status: Home / Self Care | Payer: Self-pay | Source: Home / Self Care | Attending: Surgery

## 2022-10-02 ENCOUNTER — Ambulatory Visit (HOSPITAL_COMMUNITY): Payer: BC Managed Care – PPO | Admitting: Certified Registered Nurse Anesthetist

## 2022-10-02 ENCOUNTER — Encounter (HOSPITAL_COMMUNITY): Payer: Self-pay | Admitting: Surgery

## 2022-10-02 DIAGNOSIS — R059 Cough, unspecified: Secondary | ICD-10-CM | POA: Diagnosis not present

## 2022-10-02 DIAGNOSIS — E049 Nontoxic goiter, unspecified: Secondary | ICD-10-CM | POA: Insufficient documentation

## 2022-10-02 DIAGNOSIS — R1310 Dysphagia, unspecified: Secondary | ICD-10-CM | POA: Diagnosis not present

## 2022-10-02 DIAGNOSIS — E041 Nontoxic single thyroid nodule: Secondary | ICD-10-CM | POA: Diagnosis present

## 2022-10-02 DIAGNOSIS — Z85118 Personal history of other malignant neoplasm of bronchus and lung: Secondary | ICD-10-CM | POA: Diagnosis not present

## 2022-10-02 DIAGNOSIS — Z902 Acquired absence of lung [part of]: Secondary | ICD-10-CM | POA: Diagnosis not present

## 2022-10-02 DIAGNOSIS — R519 Headache, unspecified: Secondary | ICD-10-CM | POA: Insufficient documentation

## 2022-10-02 DIAGNOSIS — K754 Autoimmune hepatitis: Secondary | ICD-10-CM | POA: Diagnosis not present

## 2022-10-02 HISTORY — PX: THYROID LOBECTOMY: SHX420

## 2022-10-02 SURGERY — LOBECTOMY, THYROID
Anesthesia: General | Site: Neck | Laterality: Right

## 2022-10-02 MED ORDER — LIDOCAINE 2% (20 MG/ML) 5 ML SYRINGE
INTRAMUSCULAR | Status: DC | PRN
  Administered 2022-10-02: 100 mg via INTRAVENOUS

## 2022-10-02 MED ORDER — KETOROLAC TROMETHAMINE 30 MG/ML IJ SOLN
INTRAMUSCULAR | Status: DC | PRN
Start: 2022-10-02 — End: 2022-10-02
  Administered 2022-10-02: 30 mg via INTRAVENOUS

## 2022-10-02 MED ORDER — SUGAMMADEX SODIUM 200 MG/2ML IV SOLN
INTRAVENOUS | Status: DC | PRN
  Administered 2022-10-02: 200 mg via INTRAVENOUS

## 2022-10-02 MED ORDER — ONDANSETRON 4 MG PO TBDP: 4.0000 mg | ORAL_TABLET | Freq: Four times a day (QID) | ORAL | Status: DC | PRN

## 2022-10-02 MED ORDER — OXYCODONE HCL 5 MG PO TABS: 5.0000 mg | ORAL_TABLET | Freq: Once | ORAL | Status: AC | PRN

## 2022-10-02 MED ORDER — PROPOFOL 1000 MG/100ML IV EMUL
INTRAVENOUS | Status: AC
  Filled 2022-10-02: qty 100

## 2022-10-02 MED ORDER — CHLORHEXIDINE GLUCONATE CLOTH 2 % EX PADS: 6.0000 | MEDICATED_PAD | Freq: Once | CUTANEOUS | Status: DC

## 2022-10-02 MED ORDER — PROGESTERONE MICRONIZED 100 MG PO CAPS
100.0000 mg | ORAL_CAPSULE | Freq: Every day | ORAL | Status: DC
  Administered 2022-10-02: 100 mg via ORAL
  Filled 2022-10-02: qty 1

## 2022-10-02 MED ORDER — CEFAZOLIN SODIUM-DEXTROSE 2-4 GM/100ML-% IV SOLN
2.0000 g | INTRAVENOUS | Status: AC
  Administered 2022-10-02: 2 g via INTRAVENOUS
  Filled 2022-10-02: qty 100

## 2022-10-02 MED ORDER — ORAL CARE MOUTH RINSE: 15.0000 mL | Freq: Once | OROMUCOSAL | Status: AC

## 2022-10-02 MED ORDER — FENTANYL CITRATE PF 50 MCG/ML IJ SOSY
PREFILLED_SYRINGE | INTRAMUSCULAR | Status: AC
  Filled 2022-10-02: qty 2

## 2022-10-02 MED ORDER — MIDAZOLAM HCL 2 MG/2ML IJ SOLN
INTRAMUSCULAR | Status: AC
  Filled 2022-10-02: qty 2

## 2022-10-02 MED ORDER — FENTANYL CITRATE (PF) 100 MCG/2ML IJ SOLN
INTRAMUSCULAR | Status: AC
  Filled 2022-10-02: qty 2

## 2022-10-02 MED ORDER — HYDROMORPHONE HCL 1 MG/ML IJ SOLN: 1.0000 mg | INTRAMUSCULAR | Status: DC | PRN

## 2022-10-02 MED ORDER — HEMOSTATIC AGENTS (NO CHARGE) OPTIME
TOPICAL | Status: DC | PRN
  Administered 2022-10-02: 1 via TOPICAL

## 2022-10-02 MED ORDER — ONDANSETRON HCL 4 MG/2ML IJ SOLN
INTRAMUSCULAR | Status: DC | PRN
  Administered 2022-10-02: 4 mg via INTRAVENOUS

## 2022-10-02 MED ORDER — ACETAMINOPHEN 325 MG PO TABS: 650.0000 mg | ORAL_TABLET | Freq: Four times a day (QID) | ORAL | Status: DC | PRN

## 2022-10-02 MED ORDER — TRAMADOL HCL 50 MG PO TABS: 50.0000 mg | ORAL_TABLET | Freq: Four times a day (QID) | ORAL | Status: DC | PRN

## 2022-10-02 MED ORDER — ONDANSETRON HCL 4 MG/2ML IJ SOLN
INTRAMUSCULAR | Status: AC
  Filled 2022-10-02: qty 2

## 2022-10-02 MED ORDER — FENTANYL CITRATE (PF) 250 MCG/5ML IJ SOLN
INTRAMUSCULAR | Status: DC | PRN
  Administered 2022-10-02 (×2): 50 ug via INTRAVENOUS
  Administered 2022-10-02: 100 ug via INTRAVENOUS

## 2022-10-02 MED ORDER — DEXAMETHASONE SODIUM PHOSPHATE 10 MG/ML IJ SOLN
INTRAMUSCULAR | Status: DC | PRN
  Administered 2022-10-02: 8 mg via INTRAVENOUS

## 2022-10-02 MED ORDER — MYCOPHENOLATE SODIUM 180 MG PO TBEC
720.0000 mg | DELAYED_RELEASE_TABLET | Freq: Two times a day (BID) | ORAL | Status: DC
  Administered 2022-10-02: 720 mg via ORAL
  Filled 2022-10-02 (×2): qty 4

## 2022-10-02 MED ORDER — URSODIOL 500 MG PO TABS: 500.0000 mg | ORAL_TABLET | Freq: Two times a day (BID) | ORAL | Status: DC

## 2022-10-02 MED ORDER — LACTATED RINGERS IV SOLN: INTRAVENOUS | Status: DC

## 2022-10-02 MED ORDER — PHENYLEPHRINE HCL (PRESSORS) 10 MG/ML IV SOLN
INTRAVENOUS | Status: AC
  Filled 2022-10-02: qty 1

## 2022-10-02 MED ORDER — FENTANYL CITRATE PF 50 MCG/ML IJ SOSY
PREFILLED_SYRINGE | INTRAMUSCULAR | Status: AC
  Administered 2022-10-02: 50 ug via INTRAVENOUS
  Filled 2022-10-02: qty 1

## 2022-10-02 MED ORDER — FENTANYL CITRATE PF 50 MCG/ML IJ SOSY
25.0000 ug | PREFILLED_SYRINGE | INTRAMUSCULAR | Status: DC | PRN
  Administered 2022-10-02: 50 ug via INTRAVENOUS

## 2022-10-02 MED ORDER — DEXAMETHASONE SODIUM PHOSPHATE 10 MG/ML IJ SOLN
INTRAMUSCULAR | Status: AC
  Filled 2022-10-02: qty 1

## 2022-10-02 MED ORDER — ROCURONIUM BROMIDE 10 MG/ML (PF) SYRINGE
PREFILLED_SYRINGE | INTRAVENOUS | Status: AC
  Filled 2022-10-02: qty 10

## 2022-10-02 MED ORDER — PROPOFOL 10 MG/ML IV BOLUS
INTRAVENOUS | Status: DC | PRN
  Administered 2022-10-02: 120 mg via INTRAVENOUS
  Administered 2022-10-02: 30 mg via INTRAVENOUS

## 2022-10-02 MED ORDER — ACETAMINOPHEN 10 MG/ML IV SOLN
INTRAVENOUS | Status: AC
  Filled 2022-10-02: qty 100

## 2022-10-02 MED ORDER — OXYCODONE HCL 5 MG/5ML PO SOLN: 5.0000 mg | Freq: Once | ORAL | Status: AC | PRN

## 2022-10-02 MED ORDER — ACETAMINOPHEN 10 MG/ML IV SOLN
1000.0000 mg | Freq: Once | INTRAVENOUS | Status: DC | PRN
  Administered 2022-10-02: 1000 mg via INTRAVENOUS

## 2022-10-02 MED ORDER — OXYCODONE HCL 5 MG PO TABS: 5.0000 mg | ORAL_TABLET | ORAL | Status: DC | PRN

## 2022-10-02 MED ORDER — CHLORHEXIDINE GLUCONATE 0.12 % MT SOLN
15.0000 mL | Freq: Once | OROMUCOSAL | Status: AC
  Administered 2022-10-02: 15 mL via OROMUCOSAL

## 2022-10-02 MED ORDER — PROPOFOL 10 MG/ML IV BOLUS
INTRAVENOUS | Status: AC
  Filled 2022-10-02: qty 20

## 2022-10-02 MED ORDER — PROPOFOL 500 MG/50ML IV EMUL
INTRAVENOUS | Status: DC | PRN
Start: 2022-10-02 — End: 2022-10-02
  Administered 2022-10-02: 100 ug/kg/min via INTRAVENOUS

## 2022-10-02 MED ORDER — MIDAZOLAM HCL 5 MG/5ML IJ SOLN
INTRAMUSCULAR | Status: DC | PRN
  Administered 2022-10-02: 2 mg via INTRAVENOUS

## 2022-10-02 MED ORDER — OXYCODONE HCL 5 MG PO TABS
ORAL_TABLET | ORAL | Status: AC
  Administered 2022-10-02: 5 mg via ORAL
  Filled 2022-10-02: qty 1

## 2022-10-02 MED ORDER — PROPOFOL 500 MG/50ML IV EMUL
INTRAVENOUS | Status: AC
  Filled 2022-10-02: qty 100

## 2022-10-02 MED ORDER — KETOROLAC TROMETHAMINE 30 MG/ML IJ SOLN
INTRAMUSCULAR | Status: AC
  Filled 2022-10-02: qty 1

## 2022-10-02 MED ORDER — SODIUM CHLORIDE 0.45 % IV SOLN: INTRAVENOUS | Status: DC

## 2022-10-02 MED ORDER — ACETAMINOPHEN 650 MG RE SUPP: 650.0000 mg | Freq: Four times a day (QID) | RECTAL | Status: DC | PRN

## 2022-10-02 MED ORDER — ONDANSETRON HCL 4 MG/2ML IJ SOLN: 4.0000 mg | Freq: Four times a day (QID) | INTRAMUSCULAR | Status: DC | PRN

## 2022-10-02 MED ORDER — PRASTERONE 6.5 MG VA INST: 1.0000 | VAGINAL_INSERT | Freq: Every day | VAGINAL | Status: DC

## 2022-10-02 MED ORDER — ONDANSETRON HCL 4 MG/2ML IJ SOLN: 4.0000 mg | Freq: Once | INTRAMUSCULAR | Status: DC | PRN

## 2022-10-02 MED ORDER — ROCURONIUM BROMIDE 10 MG/ML (PF) SYRINGE
PREFILLED_SYRINGE | INTRAVENOUS | Status: DC | PRN
  Administered 2022-10-02: 10 mg via INTRAVENOUS
  Administered 2022-10-02: 70 mg via INTRAVENOUS
  Administered 2022-10-02: 10 mg via INTRAVENOUS

## 2022-10-02 MED ORDER — 0.9 % SODIUM CHLORIDE (POUR BTL) OPTIME
TOPICAL | Status: DC | PRN
  Administered 2022-10-02: 1000 mL

## 2022-10-02 SURGICAL SUPPLY — 33 items
ADH SKN CLS APL DERMABOND .7 (GAUZE/BANDAGES/DRESSINGS) ×1
APL PRP STRL LF DISP 70% ISPRP (MISCELLANEOUS) ×1
ATTRACTOMAT 16X20 MAGNETIC DRP (DRAPES) ×1 IMPLANT
BAG COUNTER SPONGE SURGICOUNT (BAG) ×1 IMPLANT
BAG SPNG CNTER NS LX DISP (BAG) ×1
BLADE SURG 15 STRL LF DISP TIS (BLADE) ×1 IMPLANT
BLADE SURG 15 STRL SS (BLADE) ×1
CHLORAPREP W/TINT 26 (MISCELLANEOUS) ×1 IMPLANT
CLIP TI MEDIUM 6 (CLIP) ×2 IMPLANT
CLIP TI WIDE RED SMALL 6 (CLIP) ×2 IMPLANT
COVER SURGICAL LIGHT HANDLE (MISCELLANEOUS) ×1 IMPLANT
DERMABOND ADVANCED .7 DNX12 (GAUZE/BANDAGES/DRESSINGS) ×1 IMPLANT
DRAPE LAPAROTOMY T 98X78 PEDS (DRAPES) ×1 IMPLANT
DRAPE UTILITY XL STRL (DRAPES) ×1 IMPLANT
ELECT PENCIL ROCKER SW 15FT (MISCELLANEOUS) ×1 IMPLANT
ELECT REM PT RETURN 15FT ADLT (MISCELLANEOUS) ×1 IMPLANT
GAUZE 4X4 16PLY ~~LOC~~+RFID DBL (SPONGE) ×1 IMPLANT
GLOVE SURG ORTHO 8.0 STRL STRW (GLOVE) ×1 IMPLANT
GOWN STRL REUS W/ TWL XL LVL3 (GOWN DISPOSABLE) ×2 IMPLANT
GOWN STRL REUS W/TWL XL LVL3 (GOWN DISPOSABLE) ×2
HEMOSTAT SURGICEL 2X4 FIBR (HEMOSTASIS) ×1 IMPLANT
ILLUMINATOR WAVEGUIDE N/F (MISCELLANEOUS) ×1 IMPLANT
KIT BASIN OR (CUSTOM PROCEDURE TRAY) ×1 IMPLANT
KIT TURNOVER KIT A (KITS) IMPLANT
PACK BASIC VI WITH GOWN DISP (CUSTOM PROCEDURE TRAY) ×1 IMPLANT
SHEARS HARMONIC 9CM CVD (BLADE) ×1 IMPLANT
SUT MNCRL AB 4-0 PS2 18 (SUTURE) ×1 IMPLANT
SUT SILK 3 0 SH 30 (SUTURE) ×1 IMPLANT
SUT VIC AB 3-0 SH 18 (SUTURE) ×2 IMPLANT
SYR BULB IRRIG 60ML STRL (SYRINGE) ×1 IMPLANT
TOWEL OR 17X26 10 PK STRL BLUE (TOWEL DISPOSABLE) ×1 IMPLANT
TOWEL OR NON WOVEN STRL DISP B (DISPOSABLE) ×1 IMPLANT
TUBING CONNECTING 10 (TUBING) ×1 IMPLANT

## 2022-10-02 NOTE — Interval H&P Note (Signed)
History and Physical Interval Note:  10/02/2022 8:46 AM  Barbara Knight  has presented today for surgery, with the diagnosis of RIGHT THRYOID NODULE.  The various methods of treatment have been discussed with the patient and family. After consideration of risks, benefits and other options for treatment, the patient has consented to    Procedure(s): RIGHT THYROID LOBECTOMY (Right) as a surgical intervention.    The patient's history has been reviewed, patient examined, no change in status, stable for surgery.  I have reviewed the patient's chart and labs.  Questions were answered to the patient's satisfaction.    Darnell Level, MD [facility] practice Office: [phone]   Darnell Level

## 2022-10-02 NOTE — Anesthesia Postprocedure Evaluation (Signed)
Anesthesia Post Note  Patient: Teriona Helliwell Bethard  Procedure(s) Performed: RIGHT THYROID LOBECTOMY (Right: Neck)     Patient location during evaluation: PACU Anesthesia Type: General Level of consciousness: awake and alert Pain management: pain level controlled Vital Signs Assessment: post-procedure vital signs reviewed and stable Respiratory status: spontaneous breathing, nonlabored ventilation, respiratory function stable and patient connected to nasal cannula oxygen Cardiovascular status: blood pressure returned to baseline and stable Postop Assessment: no apparent nausea or vomiting Anesthetic complications: no Comments: Diastolic HTN in PACU, asymptomatic, reports that she normally runs low at home. Expect her BP will normalize without medication. Stable for discharge.   No notable events documented.  Last Vitals:  Vitals:   10/02/22 1300 10/02/22 1321  BP: (!) 145/106 (!) 155/105  Pulse: 78 75  Resp: (!) 22 20  Temp:  36.8 C  SpO2: 98% 96%    Last Pain:  Vitals:   10/02/22 1321  TempSrc: Oral  PainSc:                  Mariann Barter

## 2022-10-02 NOTE — Transfer of Care (Signed)
Immediate Anesthesia Transfer of Care Note  Patient: Barbara Knight  Procedure(s) Performed: RIGHT THYROID LOBECTOMY (Right: Neck)  Patient Location: PACU  Anesthesia Type:General  Level of Consciousness: awake and patient cooperative  Airway & Oxygen Therapy: Patient Spontanous Breathing and Patient connected to face mask oxygen  Post-op Assessment: Report given to RN and Post -op Vital signs reviewed and stable  Post vital signs: Reviewed and stable  Last Vitals:  Vitals Value Taken Time  BP 144/110 10/02/22 1122  Temp 36.4 C 10/02/22 1122  Pulse 70 10/02/22 1128  Resp 24 10/02/22 1128  SpO2 100 % 10/02/22 1128  Vitals shown include unfiled device data.  Last Pain:  Vitals:   10/02/22 0739  TempSrc: Oral  PainSc:          Complications: No notable events documented.

## 2022-10-02 NOTE — Anesthesia Procedure Notes (Signed)
Procedure Name: Intubation Date/Time: 10/02/2022 9:32 AM  Performed by: Demetrio Lapping, CRNAPre-anesthesia Checklist: Patient identified, Emergency Drugs available, Suction available and Patient being monitored Patient Re-evaluated:Patient Re-evaluated prior to induction Oxygen Delivery Method: Circle System Utilized Preoxygenation: Pre-oxygenation with 100% oxygen Induction Type: IV induction Ventilation: Mask ventilation without difficulty Laryngoscope Size: Mac and 3 Grade View: Grade I Tube type: Oral Tube size: 7.0 mm Number of attempts: 1 Airway Equipment and Method: Stylet Placement Confirmation: ETT inserted through vocal cords under direct vision, positive ETCO2 and breath sounds checked- equal and bilateral Secured at: 22 cm Tube secured with: Tape Dental Injury: Teeth and Oropharynx as per pre-operative assessment

## 2022-10-02 NOTE — Op Note (Signed)
Procedure Note  Pre-operative Diagnosis:  right thyroid lobectomy  Post-operative Diagnosis:  same  Surgeon:  Darnell Level, MD  Assistant:  Saunders Glance, PA-C   Procedure:  Right thyroid lobectomy and isthmusectomy  Anesthesia:  General  Estimated Blood Loss:  50 cc  Drains: none         Specimen: thyroid lobe to pathology  Indications:  Patient is referred by her husband, Dr. Altamease Oiler, for surgical evaluation and recommendations regarding a right-sided thyroid mass. 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 She underwent aspiration by Dr. Osborn Coho from ENT and by Dr. Irish Lack from radiology. Patient has not had recent imaging. Thyroid function has been normal in the past but she has not had a recent TSH level. Patient noted after each aspiration that the mass did not completely disappear. She is now begun having more compressive symptoms including occasional dysphagia, positional discomfort, and globus sensation. She does note a frequent cough. Patient has had no prior head or neck surgery. She has never been on thyroid medication. There is no family history of thyroid disease. Patient now comes to surgery for lobectomy.  Procedure Details: Procedure was done in OR #1 at the [facility]. The patient was brought to the operating room and placed in a supine position on the operating room table. Following administration of general anesthesia, the patient was positioned and then prepped and draped in the usual aseptic fashion. After ascertaining that an adequate level of anesthesia had been achieved, a small Kocher incision was made with #15 blade. Dissection was carried through subcutaneous tissues and platysma. Hemostasis was achieved with the electrocautery. Skin flaps were elevated cephalad and caudad from the thyroid notch to the sternal notch. A self-retaining retractor was placed for exposure. Strap muscles were incised in  the midline and dissection was begun on the right side. Strap muscles were reflected laterally. The right thyroid lobe was enlarged and extended inferiorly beneath the clavicle. The lobe was gently mobilized with blunt dissection. Superior pole vessels were dissected out and divided individually between small and medium ligaclips with the harmonic scalpel. The thyroid lobe was rolled anteriorly. Branches of the inferior thyroid artery were divided between small ligaclips with the harmonic scalpel. Inferior venous tributaries were divided between ligaclips. Both the superior and inferior parathyroid glands were identified and preserved on their vascular pedicles. The recurrent laryngeal nerve was identified and preserved along its course. The ligament of Allyson Sabal was released with the electrocautery and the gland was mobilized onto the anterior trachea. Isthmus was mobilized across the midline. There was no significant pyramidal lobe present. The thyroid parenchyma was transected at the junction of the isthmus and contralateral thyroid lobe with the harmonic scalpel. A suture was used to mark the isthmus margin. The thyroid lobe and isthmus were submitted to pathology for review.  The entire field was palpated for evidence of lymphadenopathy or extra-thyroidal disease.  No worrisome findings were noted.  No enlarged lymph nodes were identified.  The neck was irrigated with warm saline. Fibrillar was placed throughout the operative field. Strap muscles were approximated in the midline with interrupted 3-0 Vicryl sutures. Platysma was closed with interrupted 3-0 Vicryl sutures. Skin was closed with interrupted 4-0 Monocryl subcuticular sutures.  Wound was washed and dried and Dermabond was applied. The patient was awakened from anesthesia and brought to the recovery room. The patient tolerated the procedure well.   Darnell Level, MD [facility] Office: [phone]

## 2022-10-02 NOTE — Anesthesia Preprocedure Evaluation (Signed)
Anesthesia Evaluation  Patient identified by MRN, date of birth, ID band Patient awake    Reviewed: Allergy & Precautions, NPO status , Patient's Chart, lab work & pertinent test results, reviewed documented beta blocker date and time   History of Anesthesia Complications Negative for: history of anesthetic complications  Airway Mallampati: III  TM Distance: >3 FB     Dental no notable dental hx.    Pulmonary neg sleep apnea, neg COPD, neg recent URI S/p prior lobectomy for lung adenocarcinoma   breath sounds clear to auscultation       Cardiovascular (-) hypertension(-) angina  Rhythm:Regular Rate:Normal     Neuro/Psych  Headaches, neg Seizures    GI/Hepatic ,neg GERD  ,,(+) Hepatitis -Autoimmune hepatitis   Endo/Other    Renal/GU      Musculoskeletal   Abdominal   Peds  Hematology  (+) Blood dyscrasia   Anesthesia Other Findings   Reproductive/Obstetrics                             Anesthesia Physical Anesthesia Plan  ASA: 2  Anesthesia Plan: General   Post-op Pain Management:    Induction: Intravenous  PONV Risk Score and Plan: 3 and TIVA and Ondansetron  Airway Management Planned: Oral ETT  Additional Equipment:   Intra-op Plan:   Post-operative Plan: Extubation in OR  Informed Consent: I have reviewed the patients History and Physical, chart, labs and discussed the procedure including the risks, benefits and alternatives for the proposed anesthesia with the patient or authorized representative who has indicated his/her understanding and acceptance.     Dental advisory given  Plan Discussed with: CRNA  Anesthesia Plan Comments:        Anesthesia Quick Evaluation

## 2022-10-03 ENCOUNTER — Encounter (HOSPITAL_COMMUNITY): Payer: Self-pay | Admitting: Surgery

## 2022-10-03 DIAGNOSIS — R519 Headache, unspecified: Secondary | ICD-10-CM | POA: Diagnosis not present

## 2022-10-03 DIAGNOSIS — Z902 Acquired absence of lung [part of]: Secondary | ICD-10-CM | POA: Diagnosis not present

## 2022-10-03 DIAGNOSIS — R1310 Dysphagia, unspecified: Secondary | ICD-10-CM | POA: Diagnosis not present

## 2022-10-03 DIAGNOSIS — E049 Nontoxic goiter, unspecified: Secondary | ICD-10-CM | POA: Diagnosis not present

## 2022-10-03 DIAGNOSIS — Z85118 Personal history of other malignant neoplasm of bronchus and lung: Secondary | ICD-10-CM | POA: Diagnosis not present

## 2022-10-03 DIAGNOSIS — R059 Cough, unspecified: Secondary | ICD-10-CM | POA: Diagnosis not present

## 2022-10-03 DIAGNOSIS — E041 Nontoxic single thyroid nodule: Secondary | ICD-10-CM | POA: Diagnosis not present

## 2022-10-03 DIAGNOSIS — K754 Autoimmune hepatitis: Secondary | ICD-10-CM | POA: Diagnosis not present

## 2022-10-03 MED ORDER — TRAMADOL HCL 50 MG PO TABS: 50.0000 mg | ORAL_TABLET | Freq: Four times a day (QID) | ORAL | 0 refills | Status: DC | PRN

## 2022-10-03 NOTE — Discharge Instructions (Signed)

## 2022-10-03 NOTE — Progress Notes (Signed)
Discharge instructions discussed with patient and family, verbalized agreement and understanding 

## 2022-10-03 NOTE — Plan of Care (Signed)
  Problem: Education: Goal: Knowledge of General Education information will improve Description Including pain rating scale, medication(s)/side effects and non-pharmacologic comfort measures Outcome: Progressing   

## 2022-10-03 NOTE — Discharge Summary (Signed)
    Physician Discharge Summary   Patient ID: Barbara Knight MRN: [id number] DOB/AGE: Feb 12, 1965 57 y.o.  Admit date: 10/02/2022  Discharge date: 10/03/2022  Discharge Diagnoses:  Principal Problem:   Right thyroid nodule   Discharged Condition: good  Hospital Course: Patient was admitted for observation following thyroid surgery.  Post op course was uncomplicated.  Pain was well controlled.  Tolerated diet.  Patient was prepared for discharge home on POD#1.  Consults: None  Treatments: surgery: right thyroid lobectomy  Discharge Exam: Blood pressure (!) 139/97, pulse 70, temperature 98.3 F (36.8 C), temperature source Oral, resp. rate 15, height 5\' 7"  (1.702 m), weight 75.8 kg, last menstrual period 11/11/2014, SpO2 98%. HEENT - clear Neck - wound dry and intact; mild STS; voice normal; Dermabond in place  Disposition: Home  Discharge Instructions     Diet - low sodium heart healthy   Complete by: As directed    Increase activity slowly   Complete by: As directed    No dressing needed   Complete by: As directed       Allergies as of 10/03/2022   No Known Allergies      Medication List     TAKE these medications    ibuprofen 200 MG tablet Commonly known as: ADVIL Take 200-400 mg by mouth as needed for moderate pain.   Intrarosa 6.5 MG Inst Generic drug: Prasterone Place 1 tablet vaginally at bedtime.   Multi-Vitamin/Iron Tabs Take 1 tablet by mouth daily.   mycophenolate 360 MG Tbec EC tablet Commonly known as: MYFORTIC Take 720 mg by mouth 2 (two) times daily.   progesterone 100 MG capsule Commonly known as: PROMETRIUM Take 100 mg by mouth at bedtime.   traMADol 50 MG tablet Commonly known as: ULTRAM Take 1 tablet (50 mg total) by mouth every 6 (six) hours as needed for moderate pain.   ursodiol 500 MG tablet Commonly known as: ACTIGALL Take 500 mg by mouth 2 (two) times daily.               Discharge Care Instructions  (From  admission, onward)           Start     Ordered   10/03/22 0000  No dressing needed        10/03/22 0865            Follow-up Information     Darnell Level, MD. Schedule an appointment as soon as possible for a visit in 3 week(s).   Specialty: General Surgery Why: For wound re-check Contact information: [address] [place] [address] [phone]                 Darnell Level, MD [facility] Office: [phone]   Signed: Darnell Level 10/03/2022, 8:33 AM

## 2022-10-04 LAB — SURGICAL PATHOLOGY

## 2022-10-05 NOTE — Progress Notes (Signed)
Good news!  Benign final pathology as expected.  Darnell Level, MD [facility] practice Office: [phone]

## 2022-10-25 DIAGNOSIS — L570 Actinic keratosis: Secondary | ICD-10-CM | POA: Diagnosis not present

## 2022-10-25 DIAGNOSIS — D2361 Other benign neoplasm of skin of right upper limb, including shoulder: Secondary | ICD-10-CM | POA: Diagnosis not present

## 2022-10-25 DIAGNOSIS — D2262 Melanocytic nevi of left upper limb, including shoulder: Secondary | ICD-10-CM | POA: Diagnosis not present

## 2022-10-25 DIAGNOSIS — D2371 Other benign neoplasm of skin of right lower limb, including hip: Secondary | ICD-10-CM | POA: Diagnosis not present

## 2022-10-25 DIAGNOSIS — Z85828 Personal history of other malignant neoplasm of skin: Secondary | ICD-10-CM | POA: Diagnosis not present

## 2022-11-16 DIAGNOSIS — Z9009 Acquired absence of other part of head and neck: Secondary | ICD-10-CM | POA: Diagnosis not present

## 2022-11-16 DIAGNOSIS — E041 Nontoxic single thyroid nodule: Secondary | ICD-10-CM | POA: Diagnosis not present

## 2022-11-16 DIAGNOSIS — Z8639 Personal history of other endocrine, nutritional and metabolic disease: Secondary | ICD-10-CM | POA: Diagnosis not present

## 2022-11-22 ENCOUNTER — Other Ambulatory Visit: Payer: Self-pay | Admitting: Nurse Practitioner

## 2022-11-22 ENCOUNTER — Ambulatory Visit (INDEPENDENT_AMBULATORY_CARE_PROVIDER_SITE_OTHER): Payer: BC Managed Care – PPO | Admitting: Nurse Practitioner

## 2022-11-22 ENCOUNTER — Ambulatory Visit (INDEPENDENT_AMBULATORY_CARE_PROVIDER_SITE_OTHER): Payer: BC Managed Care – PPO

## 2022-11-22 ENCOUNTER — Encounter: Payer: Self-pay | Admitting: Nurse Practitioner

## 2022-11-22 VITALS — BP 110/70 | HR 75 | Ht 67.0 in | Wt 160.0 lb

## 2022-11-22 DIAGNOSIS — J40 Bronchitis, not specified as acute or chronic: Secondary | ICD-10-CM

## 2022-11-22 DIAGNOSIS — J329 Chronic sinusitis, unspecified: Secondary | ICD-10-CM | POA: Diagnosis not present

## 2022-11-22 DIAGNOSIS — C3491 Malignant neoplasm of unspecified part of right bronchus or lung: Secondary | ICD-10-CM

## 2022-11-22 DIAGNOSIS — R0602 Shortness of breath: Secondary | ICD-10-CM | POA: Diagnosis not present

## 2022-11-22 DIAGNOSIS — R058 Other specified cough: Secondary | ICD-10-CM | POA: Diagnosis not present

## 2022-11-22 MED ORDER — BENZONATATE 200 MG PO CAPS
200.0000 mg | ORAL_CAPSULE | Freq: Three times a day (TID) | ORAL | 1 refills | Status: DC | PRN
Start: 2022-11-22 — End: 2023-07-16

## 2022-11-22 MED ORDER — PREDNISONE 20 MG PO TABS
40.0000 mg | ORAL_TABLET | Freq: Every day | ORAL | 0 refills | Status: AC
Start: 2022-11-22 — End: 2022-11-27

## 2022-11-22 MED ORDER — AMOXICILLIN-POT CLAVULANATE 875-125 MG PO TABS
1.0000 | ORAL_TABLET | Freq: Two times a day (BID) | ORAL | 0 refills | Status: DC
Start: 2022-11-22 — End: 2023-07-16

## 2022-11-22 MED ORDER — AIRSUPRA 90-80 MCG/ACT IN AERO
1.0000 | INHALATION_SPRAY | RESPIRATORY_TRACT | 3 refills | Status: DC | PRN
Start: 2022-11-22 — End: 2023-07-16

## 2022-11-22 MED ORDER — HYDROCODONE BIT-HOMATROP MBR 5-1.5 MG/5ML PO SOLN
5.0000 mL | Freq: Four times a day (QID) | ORAL | 0 refills | Status: DC | PRN
Start: 2022-11-22 — End: 2023-07-16

## 2022-11-22 NOTE — Assessment & Plan Note (Signed)
Follow-up with oncology as scheduled. °

## 2022-11-22 NOTE — Patient Instructions (Signed)
-  Airsupra 1-2 puffs every 4 hours as needed for cough, wheezing, shortness of breath. Do not exceed 12 puffs in 24 hours. Use at least 3 times a day for the next few days or until symptoms improve then you can go to as needed [place]. Brush tongue and rinse mouth afterwards -Augmentin 1 tab Twice daily for 7 days. Take with food. Take a daily probiotic while taking  -Prednisone 40 mg daily for 5 days. Take in AM with food  -Hycodan cough syrup 5 mL every 6 hours as needed for cough. May cause drowsiness. Do not drive after taking -Benzonatate 1 capsule Three times a day as needed for cough -Guaifenesin [phone] mg Twice daily over the counter for cough/congestion  You can continue your other over the counter remedies. Would recommend saline rinses 1-2 times a day and flonase nasal spray unless this causes worsening nose bleeds then you can back off Afrin is ok to use but don't use longer than 3-5 days due to rebound symptoms  I'll call  you with your x ray results  Follow up with Dr. Tonia Brooms as scheduled. If symptoms do not improve or worsen, please contact office for sooner follow up or seek emergency care.

## 2022-11-22 NOTE — Assessment & Plan Note (Signed)
Likely related to viral illness.  Out of the window for testing.  However, given timeframe and failure to improve we will treat her for bacterial coinfection with empiric course of Augmentin x 7 days.  Will also treat her with prednisone course and ICS/Saba as needed.  Cough control measures.  Continue mucociliary clearance and supportive care measures.  CXR to rule out superimposed infection.  Walk test without desaturations.  Vital signs were stable.  Strict return precautions reviewed.  Patient Instructions  -Airsupra 1-2 puffs every 4 hours as needed for cough, wheezing, shortness of breath. Do not exceed 12 puffs in 24 hours. Use at least 3 times a day for the next few days or until symptoms improve then you can go to as needed [place]. Brush tongue and rinse mouth afterwards -Augmentin 1 tab Twice daily for 7 days. Take with food. Take a daily probiotic while taking  -Prednisone 40 mg daily for 5 days. Take in AM with food  -Hycodan cough syrup 5 mL every 6 hours as needed for cough. May cause drowsiness. Do not drive after taking -Benzonatate 1 capsule Three times a day as needed for cough -Guaifenesin [phone] mg Twice daily over the counter for cough/congestion  You can continue your other over the counter remedies. Would recommend saline rinses 1-2 times a day and flonase nasal spray unless this causes worsening nose bleeds then you can back off Afrin is ok to use but don't use longer than 3-5 days due to rebound symptoms  I'll call  you with your x ray results  Follow up with Dr. Tonia Brooms as scheduled. If symptoms do not improve or worsen, please contact office for sooner follow up or seek emergency care.

## 2022-11-22 NOTE — Progress Notes (Signed)
@Patient  ID: Barbara Knight, female    DOB: [birth year], 58 y.o.   MRN: [id number]  Chief Complaint  Patient presents with   Acute Visit    Pt c/o Cough and Chest Cogestion.    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  She has a history of autoimmune hepatitis and currently on chronic immunosuppression with mycophenolate.  She was last seen by Dr. Tonia Brooms 06/02/2022.  Past medical history significant for mitral valve prolapse.   TEST/EVENTS:  02/13/2022 CT chest with contrast: Enlarged lobular thyroid gland, particularly along the right side.  Unchanged from previous.  Surgical changes seen in the right upper lobe of the lung.  Associated scarring and fibrotic changes.  No recurrent lung nodule.  Tiny nodule measuring 2 mm on prior exam stable.  Previous 3 mm nodules also stable.  06/02/2022: OV with Dr. Tonia Brooms.  Recent CT imaging without evidence of recurrence of disease.  Still having some pain at the site where she had a rib fracture following surgery but otherwise functionally doing well.  No issues from respiratory standpoint.  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  No evidence of sarcoid like reaction within the lung parenchyma.  Repeat CT chest will be scheduled for January 2025 with oncology.  Follow-up as needed.  10/26/2022: Today-acute visit Patient presents today for acute visit.  She tells me that her daughter came home from college and was sick with viral like symptoms.  She contracted similar symptoms.  She has now been sick for 10 days.  She has a cough with yellow phlegm.  She also had an episode earlier where she was talking to a patient on the phone and had to get off because she felt like her airways were clamping down and she started coughing a lot.  Feels like the cough is reactive.  Cough is keeping her up at night despite over-the-counter cough  medicines.  Has some congestion in her sinuses as well.  Has some throat soreness and hoarse voice.  It does hurt to swallow liquids from time to time.  She is eating okay.  Does feel little more short winded than her baseline. Ear fullness but no pain or drainage. Has not had any fevers, hemoptysis, wheezing, calf pain or swelling.  She has been using multiple over-the-counter regimens without much relief.  She does have a rescue albuterol inhaler at home that she uses at night.  This did feel like it helped.  No Known Allergies  Immunization History  Administered Date(s) Administered   Hepatitis A, Adult 12/13/2017, 10/30/2019   Influenza,inj,Quad PF,6+ Mos 11/14/2019   Influenza-Unspecified 11/01/2016, 11/30/2017   Moderna Sars-Covid-2 Vaccination 04/09/2019, 05/07/2019    Past Medical History:  Diagnosis Date   Acquired dilation of ascending aorta and aortic root (HCC)    38mm ascending aorta by CTA and echo 03/2020 and 40mm sinus of valsalva on echo   Anemia    Bradycardia 12/16/2014   Elevated LFTs    hepatitis virus was negative   Family history of aortic aneurysm    grandfather had aortic aneurysm repair and paternal uncle died of ruptured aortic aneurysm   Headache(784.0)    migraines   Hepatitis    autoimmune   History of kidney stones    lung ca 10/25/2020   RU-lobectomy   Mitral valvular prolapse    posterior mitral vavlve leaflet with mild MR   Thyroid disease  Has a thyroid cyst    Tobacco History: Social History   Tobacco Use  Smoking Status Never  Smokeless Tobacco Never   Counseling given: Not Answered   Outpatient Medications Prior to Visit  Medication Sig Dispense Refill   ibuprofen (ADVIL) 200 MG tablet Take 200-400 mg by mouth as needed for moderate pain.     INTRAROSA 6.5 MG INST Place 1 tablet vaginally at bedtime.     Multiple Vitamins-Iron (MULTI-VITAMIN/IRON) TABS Take 1 tablet by mouth daily.     mycophenolate (MYFORTIC) 360 MG TBEC EC  tablet Take 720 mg by mouth 2 (two) times daily.     progesterone (PROMETRIUM) 100 MG capsule Take 100 mg by mouth at bedtime.     traMADol (ULTRAM) 50 MG tablet Take 1 tablet (50 mg total) by mouth every 6 (six) hours as needed for moderate pain. 12 tablet 0   ursodiol (ACTIGALL) 500 MG tablet Take 500 mg by mouth 2 (two) times daily.  3   No facility-administered medications prior to visit.     Review of Systems:   Constitutional: No weight loss or gain, night sweats, fevers, chills,  or lassitude. +fatigue  HEENT: No headaches, difficulty swallowing, tooth/dental problems. No sneezing, itching. +ear fullness, nasal congestion/drainage, sore throat, hoarse voice CV:  No chest pain, orthopnea, PND, swelling in lower extremities, anasarca, dizziness, palpitations, syncope Resp: +productive cough; chest tightness; shortness of breath with exertion. No hemoptysis. No wheezing.  No chest wall deformity GI:  No heartburn, indigestion, abdominal pain, nausea, vomiting, diarrhea, change in bowel habits, loss of appetite, bloody stools.  GU: No dysuria, change in color of urine, urgency or frequency.  No flank pain, no hematuria  Skin: No rash, lesions, ulcerations MSK:  No increased joint pain or swelling.   Neuro: No dizziness or lightheadedness.  Psych: No depression or anxiety. Mood stable.     Physical Exam:  BP 110/70 (BP Location: Left Arm, Cuff Size: Normal)   Pulse 75   Ht 5\' 7"  (1.702 m)   Wt 160 lb (72.6 kg)   LMP 11/11/2014 (Approximate)   SpO2 95%   BMI 25.06 kg/m   GEN: Pleasant, interactive, well-kempt; in no acute distress HEENT:  Normocephalic and atraumatic. EACs patent bilaterally. TM pearly gray with present light reflex bilaterally. PERRLA. Sclera white. Nasal turbinates erythematous, moist and patent bilaterally. White rhinorrhea present. Oropharynx erythematous and moist, without exudate or edema. No lesions, ulcerations NECK:  Supple w/ fair ROM. No JVD present.  Normal carotid impulses w/o bruits. Thyroid symmetrical with no goiter or nodules palpated. No lymphadenopathy.  Cervical lymphadenopathy  CV: RRR, no m/r/g, no peripheral edema. Pulses intact, +2 bilaterally. No cyanosis, pallor or clubbing. PULMONARY:  Unlabored, regular breathing. Minimal scattered rhonchi bilaterally A&P. No accessory muscle use.  GI: BS present and normoactive. Soft, non-tender to palpation. No organomegaly or masses detected.  MSK: No erythema, warmth or tenderness. Cap refil <2 sec all extrem. No deformities or joint swelling noted.  Neuro: A/Ox3. No focal deficits noted.   Skin: Warm, no lesions or rashe Psych: Normal affect and behavior. Judgement and thought content appropriate.     Lab Results:  CBC    Component Value Date/Time   WBC 4.9 09/28/2022 0916   RBC 4.78 09/28/2022 0916   HGB 13.8 09/28/2022 0916   HGB 13.5 02/13/2022 1005   HGB 13.5 04/07/2020 1542   HCT 42.7 09/28/2022 0916   HCT 40.4 04/07/2020 1542   PLT 141 (L) 09/28/2022 6948  PLT 160 02/13/2022 1005   PLT 173 04/07/2020 1542   MCV 89.3 09/28/2022 0916   MCV 89 04/07/2020 1542   MCH 28.9 09/28/2022 0916   MCHC 32.3 09/28/2022 0916   RDW 13.9 09/28/2022 0916   RDW 13.3 04/07/2020 1542   LYMPHSABS 1.6 02/13/2022 1005   MONOABS 0.6 02/13/2022 1005   EOSABS 0.1 02/13/2022 1005   BASOSABS 0.0 02/13/2022 1005    BMET    Component Value Date/Time   NA 140 02/13/2022 1005   NA 141 04/07/2020 1542   K 3.9 02/13/2022 1005   CL 107 02/13/2022 1005   CO2 27 02/13/2022 1005   GLUCOSE 81 02/13/2022 1005   BUN 20 02/13/2022 1005   BUN 19 04/07/2020 1542   CREATININE 0.85 02/13/2022 1005   CREATININE 0.99 08/04/2015 0842   CALCIUM 9.1 02/13/2022 1005   GFRNONAA >60 02/13/2022 1005   GFRAA 96 07/16/2017 1557    BNP No results found for: "BNP"   Imaging:  DG Chest 2 View  Result Date: 11/22/2022 CLINICAL DATA:  Productive cough.  Shortness of breath. EXAM: CHEST - 2 VIEW  COMPARISON:  03/08/2021 FINDINGS: The heart size and mediastinal contours are within normal limits. Pleural-parenchymal scarring again seen in the right lung apex. No evidence of acute infiltrate or edema. No evidence of pleural effusion. The visualized skeletal structures are unremarkable. IMPRESSION: Stable right apical scarring. No active cardiopulmonary disease. Electronically Signed   By: Danae Orleans M.D.   On: 11/22/2022 17:06    Administration History     None          Latest Ref Rng & Units 06/01/2021   10:09 AM 10/18/2020    8:57 AM  PFT Results  FVC-Pre L 4.14  4.08   FVC-Predicted Pre % 110  107   FVC-Post L 4.03  3.95   FVC-Predicted Post % 107  104   Pre FEV1/FVC % % 76  81   Post FEV1/FCV % % 77  83   FEV1-Pre L 3.15  3.30   FEV1-Predicted Pre % 107  111   FEV1-Post L 3.10  3.27   DLCO uncorrected ml/min/mmHg 21.77  20.65   DLCO UNC% % 96  90   DLCO corrected ml/min/mmHg 21.90    DLCO COR %Predicted % 96    DLVA Predicted % 93  88   TLC L 6.04  6.08   TLC % Predicted % 109  110   RV % Predicted % 96  92     No results found for: "NITRICOXIDE"      Assessment & Plan:   Sinobronchitis Likely related to viral illness.  Out of the window for testing.  However, given timeframe and failure to improve we will treat her for bacterial coinfection with empiric course of Augmentin x 7 days.  Will also treat her with prednisone course and ICS/Saba as needed.  Cough control measures.  Continue mucociliary clearance and supportive care measures.  CXR to rule out superimposed infection.  Walk test without desaturations.  Vital signs were stable.  Strict return precautions reviewed.  Patient Instructions  -Airsupra 1-2 puffs every 4 hours as needed for cough, wheezing, shortness of breath. Do not exceed 12 puffs in 24 hours. Use at least 3 times a day for the next few days or until symptoms improve then you can go to as needed [place]. Brush tongue and rinse mouth  afterwards -Augmentin 1 tab Twice daily for 7 days. Take with food. Take a  daily probiotic while taking  -Prednisone 40 mg daily for 5 days. Take in AM with food  -Hycodan cough syrup 5 mL every 6 hours as needed for cough. May cause drowsiness. Do not drive after taking -Benzonatate 1 capsule Three times a day as needed for cough -Guaifenesin [phone] mg Twice daily over the counter for cough/congestion  You can continue your other over the counter remedies. Would recommend saline rinses 1-2 times a day and flonase nasal spray unless this causes worsening nose bleeds then you can back off Afrin is ok to use but don't use longer than 3-5 days due to rebound symptoms  I'll call  you with your x ray results  Follow up with Dr. Tonia Brooms as scheduled. If symptoms do not improve or worsen, please contact office for sooner follow up or seek emergency care.    Adenocarcinoma of right lung, stage 1 (HCC) Follow up with oncology as scheduled.    I spent 35 minutes of dedicated to the care of this patient on the date of this encounter to include pre-visit review of records, face-to-face time with the patient discussing conditions above, post visit ordering of testing, clinical documentation with the electronic health record, making appropriate referrals as documented, and communicating necessary findings to members of the patients care team.  Noemi Chapel, NP 11/22/2022  Pt aware and understands NP's role.

## 2023-02-05 DIAGNOSIS — C44619 Basal cell carcinoma of skin of left upper limb, including shoulder: Secondary | ICD-10-CM | POA: Diagnosis not present

## 2023-02-05 DIAGNOSIS — L700 Acne vulgaris: Secondary | ICD-10-CM | POA: Diagnosis not present

## 2023-02-05 DIAGNOSIS — L738 Other specified follicular disorders: Secondary | ICD-10-CM | POA: Diagnosis not present

## 2023-02-05 DIAGNOSIS — Z85828 Personal history of other malignant neoplasm of skin: Secondary | ICD-10-CM | POA: Diagnosis not present

## 2023-02-09 ENCOUNTER — Ambulatory Visit (HOSPITAL_COMMUNITY)
Admission: RE | Admit: 2023-02-09 | Discharge: 2023-02-09 | Disposition: A | Discharge status: Home / Self Care | Payer: BC Managed Care – PPO | Source: Ambulatory Visit | Attending: Internal Medicine | Admitting: Internal Medicine

## 2023-02-09 ENCOUNTER — Encounter (HOSPITAL_COMMUNITY): Payer: Self-pay

## 2023-02-09 DIAGNOSIS — R161 Splenomegaly, not elsewhere classified: Secondary | ICD-10-CM | POA: Diagnosis not present

## 2023-02-09 DIAGNOSIS — E042 Nontoxic multinodular goiter: Secondary | ICD-10-CM | POA: Diagnosis not present

## 2023-02-09 DIAGNOSIS — C3490 Malignant neoplasm of unspecified part of unspecified bronchus or lung: Secondary | ICD-10-CM | POA: Diagnosis not present

## 2023-02-09 MED ORDER — IOHEXOL 300 MG/ML  SOLN
75.0000 mL | Freq: Once | INTRAMUSCULAR | Status: AC | PRN
  Administered 2023-02-09: 75 mL via INTRAVENOUS

## 2023-02-16 ENCOUNTER — Inpatient Hospital Stay: Payer: BC Managed Care – PPO | Attending: Internal Medicine

## 2023-02-16 DIAGNOSIS — K7460 Unspecified cirrhosis of liver: Secondary | ICD-10-CM | POA: Insufficient documentation

## 2023-02-16 DIAGNOSIS — E041 Nontoxic single thyroid nodule: Secondary | ICD-10-CM | POA: Diagnosis not present

## 2023-02-16 DIAGNOSIS — K754 Autoimmune hepatitis: Secondary | ICD-10-CM | POA: Diagnosis not present

## 2023-02-16 DIAGNOSIS — Z902 Acquired absence of lung [part of]: Secondary | ICD-10-CM | POA: Insufficient documentation

## 2023-02-16 DIAGNOSIS — Z8639 Personal history of other endocrine, nutritional and metabolic disease: Secondary | ICD-10-CM | POA: Diagnosis not present

## 2023-02-16 DIAGNOSIS — Z85118 Personal history of other malignant neoplasm of bronchus and lung: Secondary | ICD-10-CM | POA: Insufficient documentation

## 2023-02-16 DIAGNOSIS — Z9009 Acquired absence of other part of head and neck: Secondary | ICD-10-CM | POA: Diagnosis not present

## 2023-02-20 ENCOUNTER — Inpatient Hospital Stay (HOSPITAL_BASED_OUTPATIENT_CLINIC_OR_DEPARTMENT_OTHER): Payer: BC Managed Care – PPO | Admitting: Internal Medicine

## 2023-02-20 VITALS — BP 123/81 | HR 72 | Temp 97.7°F | Resp 16 | Ht 67.0 in | Wt 174.4 lb

## 2023-02-20 DIAGNOSIS — C3491 Malignant neoplasm of unspecified part of right bronchus or lung: Secondary | ICD-10-CM

## 2023-02-20 DIAGNOSIS — K7460 Unspecified cirrhosis of liver: Secondary | ICD-10-CM | POA: Diagnosis not present

## 2023-02-20 DIAGNOSIS — Z85118 Personal history of other malignant neoplasm of bronchus and lung: Secondary | ICD-10-CM | POA: Diagnosis not present

## 2023-02-20 DIAGNOSIS — Z902 Acquired absence of lung [part of]: Secondary | ICD-10-CM | POA: Diagnosis not present

## 2023-02-20 NOTE — Progress Notes (Signed)
[facility] Telephone:([phone]) [phone]   Fax:([phone]  OFFICE PROGRESS NOTE  Marcelle Overlie, MD [address] [place] [address]  DIAGNOSIS: Stage IA (T1b, N0, M0) non-small cell lung cancer, adenocarcinoma presented with right upper lobe lung nodule.  The patient has a suspicious liver lesion seen on recent MRI of the liver at [facility].  Molecular studies showed positive ALK-EML4 gene translocation with negative PD-L1 expression.  PRIOR THERAPY: Status post right upper lobectomy with lymph node dissection under the care of Dr. Dorris Fetch on October 25, 2020.  CURRENT THERAPY: Observation.  INTERVAL HISTORY: Barbara Knight 58 y.o. female returns to the clinic today for annual follow-up visit. Discussed the use of AI scribe software for clinical note transcription with the patient, who gave verbal consent to proceed.  History of Present Illness   The patient has a history of stage IA (T1b, N0, M0) non-small cell lung cancer, adenocarcinoma presented with right upper lobe lung nodule, Status post right upper lobectomy with lymph node dissection under the care of Dr. Dorris Fetch on October 25, 2020. She also has liver cirrhosis, presents for a routine follow-up regarding cancer surveillance.  The patient has a history of liver cirrhosis and is followed at [facility] for this condition, undergoing regular imaging studies and lab work. She has a known mutation associated with her cancer history, but recent scans show no evidence of cancer recurrence.  Recently, she experienced a severe cold from late October to early November, requiring two courses of steroids due to bronchitis and chronic sinusitis. She had difficulty recovering but has not had any recent COVID-19 or flu infections.  No new complaints, including chest pain, breathing issues, weight loss, night sweats, nausea, vomiting, or diarrhea.         MEDICAL HISTORY: Past Medical  History:  Diagnosis Date   Acquired dilation of ascending aorta and aortic root (HCC)    38mm ascending aorta by CTA and echo 03/2020 and 40mm sinus of valsalva on echo   Anemia    Bradycardia 12/16/2014   Elevated LFTs    hepatitis virus was negative   Family history of aortic aneurysm    grandfather had aortic aneurysm repair and paternal uncle died of ruptured aortic aneurysm   Headache(784.0)    migraines   Hepatitis    autoimmune   History of kidney stones    lung ca 10/25/2020   RU-lobectomy   Mitral valvular prolapse    posterior mitral vavlve leaflet with mild MR   Thyroid disease    Has a thyroid cyst    ALLERGIES:  has no known allergies.  MEDICATIONS:  Current Outpatient Medications  Medication Sig Dispense Refill   Albuterol-Budesonide (AIRSUPRA) 90-80 MCG/ACT AERO Inhale 1-2 puffs into the lungs every 4 (four) hours as needed. Do not exceed 12 puffs in 24 hours 10.7 g 3   amoxicillin-clavulanate (AUGMENTIN) 875-125 MG tablet Take 1 tablet by mouth 2 (two) times daily. 14 tablet 0   benzonatate (TESSALON) 200 MG capsule Take 1 capsule (200 mg total) by mouth 3 (three) times daily as needed for cough. 30 capsule 1   HYDROcodone bit-homatropine (HYCODAN) 5-1.5 MG/5ML syrup Take 5 mLs by mouth every 6 (six) hours as needed for cough. 240 mL 0   ibuprofen (ADVIL) 200 MG tablet Take 200-400 mg by mouth as needed for moderate pain.     INTRAROSA 6.5 MG INST Place 1 tablet vaginally at bedtime.  Multiple Vitamins-Iron (MULTI-VITAMIN/IRON) TABS Take 1 tablet by mouth daily.     mycophenolate (MYFORTIC) 360 MG TBEC EC tablet Take 720 mg by mouth 2 (two) times daily.     progesterone (PROMETRIUM) 100 MG capsule Take 100 mg by mouth at bedtime.     traMADol (ULTRAM) 50 MG tablet Take 1 tablet (50 mg total) by mouth every 6 (six) hours as needed for moderate pain. 12 tablet 0   ursodiol (ACTIGALL) 500 MG tablet Take 500 mg by mouth 2 (two) times daily.  3   No current  facility-administered medications for this visit.    SURGICAL HISTORY:  Past Surgical History:  Procedure Laterality Date   BRONCHIAL BIOPSY  10/25/2020   Procedure: BRONCHIAL BIOPSIES;  Surgeon: Josephine Igo, DO;  Location: [facility];  Service: Pulmonary;;   BRONCHIAL BRUSHINGS  10/25/2020   Procedure: BRONCHIAL BRUSHINGS;  Surgeon: Josephine Igo, DO;  Location: [facility];  Service: Pulmonary;;   DILATATION & CURETTAGE/HYSTEROSCOPY WITH TRUECLEAR N/A 09/13/2012   Procedure: DILATATION & CURETTAGE/HYSTEROSCOPY WITH TRUECLEAR and THERMACHOICE;  Surgeon: Jeani Hawking, MD;  Location: [facility];  Service: Gynecology;  Laterality: N/A;   DILATATION & CURRETTAGE/HYSTEROSCOPY WITH RESECTOCOPE N/A 09/13/2012   Procedure: DILATATION & CURETTAGE/HYSTEROSCOPY WITH RESECTOCOPE;  Surgeon: Jeani Hawking, MD;  Location: [facility];  Service: Gynecology;  Laterality: N/A;   DILATION AND CURETTAGE OF UTERUS     INTERCOSTAL NERVE BLOCK Right 10/25/2020   Procedure: INTERCOSTAL NERVE BLOCK;  Surgeon: Loreli Slot, MD;  Location: [facility] OR;  Service: Thoracic;  Laterality: Right;   LIVER BIOPSY     x 2   LYMPH NODE DISSECTION Right 10/25/2020   Procedure: LYMPH NODE DISSECTION;  Surgeon: Loreli Slot, MD;  Location: [facility] OR;  Service: Thoracic;  Laterality: Right;   SUBMUCOSAL TATTOO INJECTION  10/25/2020   Procedure: SUBMUCOSAL TATTOO INJECTION;  Surgeon: Josephine Igo, DO;  Location: [facility];  Service: Pulmonary;;   THYROID LOBECTOMY Right 10/02/2022   Procedure: RIGHT THYROID LOBECTOMY;  Surgeon: Darnell Level, MD;  Location: [facility];  Service: General;  Laterality: Right;   UTERINE FIBROID SURGERY     VIDEO BRONCHOSCOPY WITH ENDOBRONCHIAL NAVIGATION Right 10/25/2020   Procedure: VIDEO BRONCHOSCOPY WITH ENDOBRONCHIAL NAVIGATION;  Surgeon: Josephine Igo, DO;  Location: [facility];  Service: Pulmonary;  Laterality: Right;  ION + Fiducial Dye Marking    REVIEW OF SYSTEMS:   Constitutional: negative Eyes: negative Ears, nose, mouth, throat, and face: negative Respiratory: negative Cardiovascular: negative Gastrointestinal: negative Genitourinary:negative Integument/breast: negative Hematologic/lymphatic: negative Musculoskeletal:negative Neurological: negative Behavioral/Psych: negative Endocrine: negative Allergic/Immunologic: negative   PHYSICAL EXAMINATION: General appearance: alert, cooperative, and no distress Head: Normocephalic, without obvious abnormality, atraumatic Neck: no adenopathy, no JVD, supple, symmetrical, trachea midline, and thyroid not enlarged, symmetric, no tenderness/mass/nodules Lymph nodes: Cervical, supraclavicular, and axillary nodes normal. Resp: clear to auscultation bilaterally Back: symmetric, no curvature. ROM normal. No CVA tenderness. Cardio: regular rate and rhythm, S1, S2 normal, no murmur, click, rub or gallop GI: soft, non-tender; bowel sounds normal; no masses,  no organomegaly Extremities: extremities normal, atraumatic, no cyanosis or edema Neurologic: Alert and oriented X 3, normal strength and tone. Normal symmetric reflexes. Normal coordination and gait  ECOG PERFORMANCE STATUS: 1 - Symptomatic but completely ambulatory  Blood pressure 123/81, pulse 72, temperature 97.7 F (36.5 C), temperature source Temporal, resp. rate 16, height 5\' 7"  (1.702 m), weight 174 lb 6.4 oz (79.1 kg), last menstrual period 11/11/2014, SpO2 99%.  LABORATORY DATA:  Lab Results  Component Value Date   WBC 4.9 09/28/2022   HGB 13.8 09/28/2022   HCT 42.7 09/28/2022   MCV 89.3 09/28/2022   PLT 141 (L) 09/28/2022      Chemistry      Component Value Date/Time   NA 140 02/13/2022 1005   NA 141 04/07/2020 1542   K 3.9 02/13/2022 1005   CL 107 02/13/2022 1005   CO2 27 02/13/2022 1005   BUN 20 02/13/2022 1005   BUN 19 04/07/2020 1542   CREATININE 0.85 02/13/2022 1005   CREATININE 0.99 08/04/2015 0842      Component Value  Date/Time   CALCIUM 9.1 02/13/2022 1005   ALKPHOS 133 (H) 02/13/2022 1005   AST 24 02/13/2022 1005   ALT 22 02/13/2022 1005   BILITOT 0.7 02/13/2022 1005       RADIOGRAPHIC STUDIES: CT Chest W Contrast Result Date: 02/17/2023 CLINICAL DATA:  Restaging non-small cell lung cancer. EXAM: CT CHEST WITH CONTRAST TECHNIQUE: Multidetector CT imaging of the chest was performed during intravenous contrast administration. RADIATION DOSE REDUCTION: This exam was performed according to the departmental dose-optimization program which includes automated exposure control, adjustment of the mA and/or kV according to patient size and/or use of iterative reconstruction technique. CONTRAST:  75mL OMNIPAQUE IOHEXOL 300 MG/ML  SOLN COMPARISON:  02/13/2022 CT scan.  Prior PET-CT 12/01/2020 FINDINGS: Cardiovascular: The heart is normal in size. No pericardial effusion. The aorta is normal in caliber. Incidental ductus diverticulum. No aortic dissection. Minimal atherosclerotic calcifications. No definite coronary artery calcifications. Mediastinum/Nodes: No mediastinal or hilar mass or lymphadenopathy. Small scattered lymph nodes are stable. The esophagus is unremarkable. Multiple small left thyroid nodules are stable. Status post right thyroidectomy. Lungs/Pleura: Stable surgical changes from a right upper lobe lobectomy. No findings suspicious for recurrent tumor. No new pulmonary lesions or acute pulmonary process. No pleural effusions or pleural nodules. The tracheobronchial tree is unremarkable. Upper Abdomen: Stable severe cirrhotic changes involving the liver. No hepatic lesions are identified. Stable mild splenomegaly. Upper abdominal lymph nodes typical with cirrhosis. No adrenal gland metastasis. Musculoskeletal: No significant bony findings. IMPRESSION: 1. Stable surgical changes from a right upper lobe lobectomy. No findings suspicious for recurrent tumor or metastatic disease. 2. No mediastinal or hilar mass or  lymphadenopathy. 3. Stable severe cirrhotic changes involving the liver. No hepatic lesions are identified. 4. Stable mild splenomegaly. Electronically Signed   By: Rudie Meyer M.D.   On: 02/17/2023 20:52    ASSESSMENT AND PLAN: This is a very pleasant 58 years old white female diagnosed with a stage IA (T1b, N0, M0) non-small cell lung cancer, adenocarcinoma presented with right upper lobe lung nodule status post right upper lobectomy with lymph node dissection under the care of Dr. Dorris Fetch on October 25, 2020.  The patient had molecular studies that showed positive ALK-EML4 gene translocation and negative PD-L1 expression. The patient is currently on observation and she is feeling fine with no concerning complaints. She had repeat CT scan of the chest performed recently.  I personally and independently reviewed the scan and discussed the result with the patient today. Her scan showed no concerning findings for disease recurrence or metastasis.    Lung Cancer (Stage 1) Stage 1 lung cancer with no recurrence on recent imaging. ALK mutation present, with multiple targeted treatment options available. Circulating tumor DNA not currently applicable for early detection of lung cancer. High likelihood of cure at current stage, with effective treatments available for advanced stages. - Schedule follow-up scan  and lab work one week before the next annual visit  Liver Cirrhosis Liver cirrhosis managed at [facility] with regular imaging and lab work. - Continue follow-up with [facility] for liver cirrhosis management  General Health Maintenance Severe cold in late October to early November, treated with steroids, leading to bronchitis and chronic sinusitis. No recent COVID-19 or flu infections. No new symptoms reported. - Continue routine health maintenance and monitor for new symptoms  Follow-up - Follow up with Dr. Corliss Blacker for future procedures and care.   The patient was advised to call immediately if she has  any other concerning symptoms in the interval.  The patient voices understanding of current disease status and treatment options and is in agreement with the current care plan.  All questions were answered. The patient knows to call the clinic with any problems, questions or concerns. We can certainly see the patient much sooner if necessary.  The total time spent in the appointment was 30 minutes.  Disclaimer: This note was dictated with voice recognition software. Similar sounding words can inadvertently be transcribed and may not be corrected upon review.

## 2023-02-21 DIAGNOSIS — E041 Nontoxic single thyroid nodule: Secondary | ICD-10-CM | POA: Diagnosis not present

## 2023-02-21 DIAGNOSIS — Z9009 Acquired absence of other part of head and neck: Secondary | ICD-10-CM | POA: Diagnosis not present

## 2023-03-20 DIAGNOSIS — H04123 Dry eye syndrome of bilateral lacrimal glands: Secondary | ICD-10-CM | POA: Diagnosis not present

## 2023-03-20 DIAGNOSIS — H2513 Age-related nuclear cataract, bilateral: Secondary | ICD-10-CM | POA: Diagnosis not present

## 2023-03-20 DIAGNOSIS — H40053 Ocular hypertension, bilateral: Secondary | ICD-10-CM | POA: Diagnosis not present

## 2023-03-20 DIAGNOSIS — H5203 Hypermetropia, bilateral: Secondary | ICD-10-CM | POA: Diagnosis not present

## 2023-03-20 DIAGNOSIS — H524 Presbyopia: Secondary | ICD-10-CM | POA: Diagnosis not present

## 2023-04-11 DIAGNOSIS — K7689 Other specified diseases of liver: Secondary | ICD-10-CM | POA: Diagnosis not present

## 2023-04-11 DIAGNOSIS — E890 Postprocedural hypothyroidism: Secondary | ICD-10-CM | POA: Diagnosis not present

## 2023-04-11 DIAGNOSIS — R0982 Postnasal drip: Secondary | ICD-10-CM | POA: Diagnosis not present

## 2023-04-11 DIAGNOSIS — Z85118 Personal history of other malignant neoplasm of bronchus and lung: Secondary | ICD-10-CM | POA: Diagnosis not present

## 2023-04-11 DIAGNOSIS — I77810 Thoracic aortic ectasia: Secondary | ICD-10-CM | POA: Diagnosis not present

## 2023-04-19 DIAGNOSIS — K7689 Other specified diseases of liver: Secondary | ICD-10-CM | POA: Diagnosis not present

## 2023-04-19 DIAGNOSIS — D849 Immunodeficiency, unspecified: Secondary | ICD-10-CM | POA: Diagnosis not present

## 2023-04-19 DIAGNOSIS — K754 Autoimmune hepatitis: Secondary | ICD-10-CM | POA: Diagnosis not present

## 2023-05-08 DIAGNOSIS — C44712 Basal cell carcinoma of skin of right lower limb, including hip: Secondary | ICD-10-CM | POA: Diagnosis not present

## 2023-05-08 DIAGNOSIS — C44719 Basal cell carcinoma of skin of left lower limb, including hip: Secondary | ICD-10-CM | POA: Diagnosis not present

## 2023-05-08 DIAGNOSIS — D2361 Other benign neoplasm of skin of right upper limb, including shoulder: Secondary | ICD-10-CM | POA: Diagnosis not present

## 2023-05-08 DIAGNOSIS — L700 Acne vulgaris: Secondary | ICD-10-CM | POA: Diagnosis not present

## 2023-05-08 DIAGNOSIS — L570 Actinic keratosis: Secondary | ICD-10-CM | POA: Diagnosis not present

## 2023-05-08 DIAGNOSIS — L821 Other seborrheic keratosis: Secondary | ICD-10-CM | POA: Diagnosis not present

## 2023-05-08 DIAGNOSIS — Z85828 Personal history of other malignant neoplasm of skin: Secondary | ICD-10-CM | POA: Diagnosis not present

## 2023-05-23 DIAGNOSIS — M79641 Pain in right hand: Secondary | ICD-10-CM | POA: Diagnosis not present

## 2023-05-23 DIAGNOSIS — E890 Postprocedural hypothyroidism: Secondary | ICD-10-CM | POA: Diagnosis not present

## 2023-05-23 DIAGNOSIS — Z23 Encounter for immunization: Secondary | ICD-10-CM | POA: Diagnosis not present

## 2023-05-23 DIAGNOSIS — K7689 Other specified diseases of liver: Secondary | ICD-10-CM | POA: Diagnosis not present

## 2023-05-25 ENCOUNTER — Telehealth: Payer: Self-pay

## 2023-05-25 DIAGNOSIS — I77810 Thoracic aortic ectasia: Secondary | ICD-10-CM

## 2023-05-25 DIAGNOSIS — I340 Nonrheumatic mitral (valve) insufficiency: Secondary | ICD-10-CM

## 2023-05-25 NOTE — Telephone Encounter (Signed)
-----   Message from Barbara Knight sent at 05/24/2023  9:52 PM EDT ----- Patient is due for 2D echo with mild MR and dilated aorta.  Please call her and get this set up.  Then get OV with me a 1-2 weeks after echo completed

## 2023-05-25 NOTE — Telephone Encounter (Signed)
 Spoke with pt regarding an echo. Pt agreeable and aware. An echo was ordered and a message to scheduling was sent to find an appointment for her afterward. Pt verbalized understanding. All questions if any were answered.

## 2023-06-28 ENCOUNTER — Ambulatory Visit (HOSPITAL_COMMUNITY)
Admission: RE | Admit: 2023-06-28 | Discharge: 2023-06-28 | Disposition: A | Discharge status: Home / Self Care | Source: Ambulatory Visit | Attending: Internal Medicine | Admitting: Internal Medicine

## 2023-06-28 ENCOUNTER — Ambulatory Visit: Payer: Self-pay | Admitting: Cardiology

## 2023-06-28 DIAGNOSIS — I77810 Thoracic aortic ectasia: Secondary | ICD-10-CM | POA: Diagnosis not present

## 2023-06-28 DIAGNOSIS — I340 Nonrheumatic mitral (valve) insufficiency: Secondary | ICD-10-CM | POA: Diagnosis not present

## 2023-06-29 LAB — ECHOCARDIOGRAM COMPLETE
Area-P 1/2: 3.68 cm2
S' Lateral: 3 cm

## 2023-07-03 NOTE — Telephone Encounter (Signed)
 Per Dr. Charl Concha request, an order for gated Chest CT for dilated aortic root placed. Sent message to CT imaging department to advise that we need aortic measurements at the time of the chest CT that should be in the order queue for January 2026.

## 2023-07-03 NOTE — Telephone Encounter (Signed)
-----   Message from Gaylyn Keas sent at 06/28/2023  6:38 PM EDT ----- I have talked to Hugo Maes about the results. She does not have grade 2 diastolic dysfunction like was read out and I have asked for this to be corrected in the report.  Her ascending aorta is normal when adjusted for BSA.  Her aortic root is very mildly dilated at 38mm.  She is getting a contrasted chest CT in January 2026 - please add on an order for gated Chest CT for dilated aortic root at the same time and let Jinger Mount know that we need aortic measurements at the time of the CT

## 2023-07-15 NOTE — Progress Notes (Unsigned)
 Cardiology Office Note:    Date:  07/16/2023   ID:  Barbara, Knight December 05, 1965, MRN [id number]  PCP:  Mat Browning, MD  Cardiologist:  None    Referring MD: Mat Browning, MD   Chief Complaint  Patient presents with   Follow-up    MR, dilated aorta    History of Present Illness:    Barbara Knight is a 58 y.o. female with a hx of asymptomatic bradycardia, autoimmune hepatitis followed at [facility] with dx of primary biliary cholangitis with advanced liver cirrhosis.  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 Her last imaging included a 2D echo 04/12/2020 showing normal LVF (EF65-70%) with normal LV strain, no MVP and trivial MR.  She has also had borderline dilatation of her aortic arch in the past (35mm in 2016>>92mm in 2019>>61mm in 2021) as well as ascending aorta (36>>57mm by serial echo assessment).  A 2D echo was ordered to assess LVF after having COVID 19 as well as reassess her MV.  Her echo showed an increase in her aortic root at the SOV and given a family hx of aortic aneurysms (Grandfather had aortic aneurysm repair and his brother died of a ruptured aortic aneurysm), a gated CTA of the chest/abd/pelvis was performed.  T  CTA showed a coronary Ca score of 0, ectatic mildly dilated ascending aorta at 3.8cm and mildly dilated aortic root at 4cm.  No atherosclerotic plaque was noted. There was also ground glass attenuation nodular opacities in the right lung and non contrast CT was recommended in followup in 3-6 months.  Of note, she does have a fm hx of adenoCA of the lung in her mom who was not a smoker.    The abdominal and pelvic CTA showed  Cirrhotic liver with multiple arterially enhancing nodules possibly representing regenerating nodules, dysplastic nodules or HPC.  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 She was ultimately dx with autoimmune hepatitis (+smAb) with EGD 2022 showing grade 1 esophageal varices and  portal gastropathy.  She has been on multiple different immunosuppressants since dx including Budesonide , azathioprine, Cellcept, Myfortic ,   She ultimately was dx with Stage 1A non-small cell lung Ca of the RUL and underwent lobectomy 10/2020.  She is followed by Oncology and Pulmonary.  She also underwent partial thyroidectomy  09/2022 for large benign nodule with obstructive sx. Now on synthroid.  She is here today for followup and discussion of her scans.  She denies any chest pain or pressure, PND, orthopnea, LE edema, dizziness, palpitations or syncope. She thinks her SOB has improved some.   I talked with her on the phone when her CT report came back and she tried to contact her hepatologist at [facility] but has not heard back from him.  She is understandibaly anxious about all the findings on CT.   Past Medical History:  Diagnosis Date   Acquired dilation of ascending aorta and aortic root (HCC)    38mm ascending aorta by CTA and echo 03/2020 and 40mm sinus of valsalva on echo   Anemia    Bradycardia 12/16/2014   Elevated LFTs    hepatitis virus was negative   Family history of aortic aneurysm    grandfather had aortic aneurysm repair and paternal uncle died of ruptured aortic aneurysm   Headache(784.0)    migraines   Hepatitis    autoimmune   History of kidney stones    lung ca 10/25/2020  RU-lobectomy   Mitral valvular prolapse    posterior mitral vavlve leaflet with mild MR   Thyroid  disease    Has a thyroid  cyst    Past Surgical History:  Procedure Laterality Date   BRONCHIAL BIOPSY  10/25/2020   Procedure: BRONCHIAL BIOPSIES;  Surgeon: Brenna Adine CROME, DO;  Location: [facility];  Service: Pulmonary;;   BRONCHIAL BRUSHINGS  10/25/2020   Procedure: BRONCHIAL BRUSHINGS;  Surgeon: Brenna Adine CROME, DO;  Location: [facility];  Service: Pulmonary;;   DILATATION & CURETTAGE/HYSTEROSCOPY WITH TRUECLEAR N/A 09/13/2012   Procedure: DILATATION & CURETTAGE/HYSTEROSCOPY WITH  TRUECLEAR and THERMACHOICE;  Surgeon: Rosaline CROME Cobble, MD;  Location: [facility];  Service: Gynecology;  Laterality: N/A;   DILATATION & CURRETTAGE/HYSTEROSCOPY WITH RESECTOCOPE N/A 09/13/2012   Procedure: DILATATION & CURETTAGE/HYSTEROSCOPY WITH RESECTOCOPE;  Surgeon: Rosaline CROME Cobble, MD;  Location: [facility];  Service: Gynecology;  Laterality: N/A;   DILATION AND CURETTAGE OF UTERUS     INTERCOSTAL NERVE BLOCK Right 10/25/2020   Procedure: INTERCOSTAL NERVE BLOCK;  Surgeon: Kerrin Elspeth BROCKS, MD;  Location: [facility] OR;  Service: Thoracic;  Laterality: Right;   LIVER BIOPSY     x 2   LYMPH NODE DISSECTION Right 10/25/2020   Procedure: LYMPH NODE DISSECTION;  Surgeon: Kerrin Elspeth BROCKS, MD;  Location: [facility];  Service: Thoracic;  Laterality: Right;   SUBMUCOSAL TATTOO INJECTION  10/25/2020   Procedure: SUBMUCOSAL TATTOO INJECTION;  Surgeon: Brenna Adine CROME, DO;  Location: [facility];  Service: Pulmonary;;   THYROID  LOBECTOMY Right 10/02/2022   Procedure: RIGHT THYROID  LOBECTOMY;  Surgeon: Eletha Boas, MD;  Location: [facility];  Service: General;  Laterality: Right;   UTERINE FIBROID SURGERY     VIDEO BRONCHOSCOPY WITH ENDOBRONCHIAL NAVIGATION Right 10/25/2020   Procedure: VIDEO BRONCHOSCOPY WITH ENDOBRONCHIAL NAVIGATION;  Surgeon: Brenna Adine CROME, DO;  Location: [facility];  Service: Pulmonary;  Laterality: Right;  ION + Fiducial Dye Marking    Current Medications: Current Meds  Medication Sig   ibuprofen  (ADVIL ) 200 MG tablet Take 200-400 mg by mouth as needed for moderate pain.   INTRAROSA  6.5 MG INST Place 1 tablet vaginally at bedtime.   levothyroxine (SYNTHROID) 50 MCG tablet Take 50 mcg by mouth every morning.   Multiple Vitamins-Iron  (MULTI-VITAMIN/IRON ) TABS Take 1 tablet by mouth daily.   mycophenolate  (MYFORTIC ) 360 MG TBEC EC tablet Take 720 mg by mouth 2 (two) times daily.   progesterone  (PROMETRIUM ) 100 MG capsule Take 100 mg by mouth at bedtime.   ursodiol  (ACTIGALL ) 500 MG  tablet Take 500 mg by mouth 2 (two) times daily.     Allergies:   Patient has no known allergies.   Social History   Socioeconomic History   Marital status: Married    Spouse name: Not on file   Number of children: Not on file   Years of education: Not on file   Highest education level: Not on file  Occupational History   Not on file  Tobacco Use   Smoking status: Never   Smokeless tobacco: Never  Vaping Use   Vaping status: Never Used  Substance and Sexual Activity   Alcohol use: Not Currently    Comment: stopped 10/21   Drug use: No   Sexual activity: Yes  Other Topics Concern   Not on file  Social History Narrative   Not on file   Social Drivers of Health   Financial Resource Strain: Not on file  Food Insecurity: Not on file  Transportation Needs: Not  on file  Physical Activity: Not on file  Stress: Not on file  Social Connections: Not on file     Family History: The patient's family history includes Aortic aneurysm in her paternal grandfather and paternal uncle; Barrett's esophagus in her mother; Hypertension in her father; Lung cancer in her mother. There is no history of Colon cancer, Colon polyps, Esophageal cancer, Rectal cancer, or Stomach cancer.  ROS:   Please see the history of present illness.    ROS  All other systems reviewed and negative.   EKGs/Labs/Other Studies Reviewed:    The following studies were reviewed today:  EKG Interpretation Date/Time:  Monday July 16 2023 09:01:26 EDT Ventricular Rate:  79 PR Interval:  174 QRS Duration:  84 QT Interval:  398 QTC Calculation: 456 R Axis:   10  Text Interpretation: Normal sinus rhythm Normal ECG When compared with ECG of 24-Dec-2020 22:04, No significant change was found Confirmed by Shlomo Corning ([id number]) on 07/16/2023 9:06:00 AM    2D echo 06/2023 IMPRESSIONS    1. Left ventricular ejection fraction, by estimation, is 55 to 60%. Left  ventricular ejection fraction by 3D volume is 57 %.  The left ventricle has  normal function. The left ventricle has no regional wall motion  abnormalities. Left ventricular diastolic   parameters are indeterminate. The average left ventricular global  longitudinal strain is -25.4 %. The global longitudinal strain is normal.   2. Right ventricular systolic function is normal. The right ventricular  size is normal. There is normal pulmonary artery systolic pressure.   3. The mitral valve is normal in structure. Trivial mitral valve  regurgitation. No evidence of mitral stenosis.   4. The aortic valve is tricuspid. Aortic valve regurgitation is not  visualized. No aortic stenosis is present.   5. Aortic dilatation noted. There is mild dilatation of the aortic root,  measuring 40 mm.   6. The inferior vena cava is normal in size with greater than 50%  respiratory variability, suggesting right atrial pressure of 3 mmHg.   Gated Chest/ABD/PELVIC CTA 03/2020 IMPRESSION: 1. Ectatic but nonaneurysmal ascending thoracic aorta with a maximal diameter of 3.8 cm. 2. There are 2 ground-glass attenuation nodular opacities in the right lung. The larger measures up to 1.2 cm and contains a central 3 mm solid component. The smaller lesion measures up to 0.7 cm in is positioned within the right lower lobe. Differential considerations include an active infectious/inflammatory process versus low-grade adenocarcinoma. Non-contrast chest CT at 3-6 months is recommended. If nodules persist, subsequent management will be based upon the most suspicious nodule(s). This recommendation follows the consensus statement: Guidelines for Management of Incidental Pulmonary Nodules Detected on CT Images: From the Fleischner Society 2017; Radiology 2017; 284:228-243. 3. Cirrhotic liver containing multiple arterially enhancing nodules. These are incompletely characterized on this single-phase study. Differential considerations include regenerating nodules,  dysplastic nodules, and hepatocellular carcinoma. Recommend further evaluation with gadolinium-enhanced MRI of the abdomen. 4. 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 This could also be further assessed with abdominal MRI. 5. 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   Recent Labs: 09/28/2022: Hemoglobin 13.8; Platelets 141   Recent Lipid Panel    Component Value Date/Time   CHOL 188 12/22/2014 0903   TRIG 68 12/22/2014 0903   HDL 93 12/22/2014 0903   CHOLHDL 2.0 12/22/2014 0903   VLDL 14 12/22/2014 0903   LDLCALC 81 12/22/2014 0903    Physical Exam:  VS:  BP 98/82   Pulse 79   Ht 5' 7 (1.702 m)   Wt 170 lb (77.1 kg)   LMP 11/11/2014 (Approximate)   SpO2 98%   BMI 26.63 kg/m     Wt Readings from Last 3 Encounters:  07/16/23 170 lb (77.1 kg)  02/20/23 174 lb 6.4 oz (79.1 kg)  11/22/22 160 lb (72.6 kg)     GEN: Well nourished, well developed in no acute distress HEENT: Normal NECK: No JVD; No carotid bruits LYMPHATICS: No lymphadenopathy CARDIAC:RRR, no murmurs, rubs, gallops RESPIRATORY:  Clear to auscultation without rales, wheezing or rhonchi  ABDOMEN: Soft, non-tender, non-distended MUSCULOSKELETAL:  No edema; No deformity  SKIN: Warm and dry NEUROLOGIC:  Alert and oriented x 3 PSYCHIATRIC:  Normal affect    ASSESSMENT:    1. Mitral valve insufficiency, unspecified etiology   2. Acquired dilation of ascending aorta and aortic root (HCC)   3. Autoimmune hepatitis (HCC)   4. Bradycardia   5. Medication management   6. Hyperlipidemia, unspecified hyperlipidemia type   7. Heart palpitations     PLAN:    In order of problems listed above:  Mitral valve disease -she has a hx of MVP of the posterior MV leaflet with mild MR 2016 -2D echo 03/2020 showed normal LVF with no MVP and trivial MR -2D echo 06/2023 showed EF 55-60%, trivial MR with no MVP -she is asymptomatic -repeat echo 06/2025  Dilated aortic root  and ascending aorta Aortic atherosclerosis -2D echo 2019 with aortic root dimension 38mm and ascending aorta 36mm -CT chest 04/2022 AAo 4.1cm -she has a fm hx of aortic aneurysms>>her grandfather had an aortic aneurysm repair and her GFs brother died of a ruptured aortic aneurysm -Chest CT 01/2023 Ao normal caliber -2D echo 06/2023 AAo3.9cm and AoR 3.8cm>> when indexed for BSA this is actually within normal limits -will reassess aortic dimensions at her next followup Chest CT1/2026>I have asked for a gated CT when she has her followup scan for hx of Lung CA  -check FLP and ALT  Autoimmune hepatitis -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 -followed at [facility] -complicated by grade 1 esophageal varices and portal HTN -currently on no BB therapy due to resting bradycardia  Hx of Asymptomatic Bradycardia -not bradycardic on exam today -remains asymptomatic with no dizziness, presyncope/syncope, weakness or exercise intolerance  Borderline elevated DBP with no hx of HTN -she has a fm hx of HTN -she will check BP twice daily for a week and call with resuilts -she sill check to see if her Abdominal MRI can look at renal arteries and if not will get renal duplex to rule out RAS -encourage her to avoid added salt to the diet  Palpitations Paroxysmal atrial tachycardia PACs -She continues to have episodic palpitations but it is only for a few beats at a time but it is not lasting long enough for her to get a reading on her Apple Watch - Symptoms have not changed any in duration or quality since she wore her event monitor in 2016 which showed PAT and PACs - It does not sound like she is having atrial fibrillation - We will continue watchful waiting for now and I asked her to keep a monitor on the palpitations.  If they become longer in duration then I have recommended she try to catch an event on her Apple Watch to send to me - No beta-blocker at this time given her soft SBP     Followup with me in  1 year   Medication Adjustments/Labs and Tests Ordered: Current medicines are reviewed at length with the patient today.  Concerns regarding medicines are outlined above.  Orders Placed This Encounter  Procedures   CT CARDIAC SCORING   Lipid panel   ALT   EKG 12-Lead   ECHOCARDIOGRAM COMPLETE   No orders of the defined types were placed in this encounter.   Signed, Wilbert Bihari, MD  07/16/2023 10:16 AM    [facility]

## 2023-07-16 ENCOUNTER — Ambulatory Visit: Attending: Cardiology | Admitting: Cardiology

## 2023-07-16 ENCOUNTER — Encounter: Payer: Self-pay | Admitting: Cardiology

## 2023-07-16 ENCOUNTER — Other Ambulatory Visit: Payer: Self-pay

## 2023-07-16 VITALS — BP 98/82 | HR 79 | Ht 67.0 in | Wt 170.0 lb

## 2023-07-16 DIAGNOSIS — R002 Palpitations: Secondary | ICD-10-CM

## 2023-07-16 DIAGNOSIS — Z79899 Other long term (current) drug therapy: Secondary | ICD-10-CM

## 2023-07-16 DIAGNOSIS — I77810 Thoracic aortic ectasia: Secondary | ICD-10-CM

## 2023-07-16 DIAGNOSIS — E785 Hyperlipidemia, unspecified: Secondary | ICD-10-CM

## 2023-07-16 DIAGNOSIS — K754 Autoimmune hepatitis: Secondary | ICD-10-CM

## 2023-07-16 DIAGNOSIS — I340 Nonrheumatic mitral (valve) insufficiency: Secondary | ICD-10-CM

## 2023-07-16 DIAGNOSIS — R001 Bradycardia, unspecified: Secondary | ICD-10-CM

## 2023-07-16 NOTE — Patient Instructions (Signed)
 Medication Instructions:  Your physician recommends that you continue on your current medications as directed. Please refer to the Current Medication list given to you today.  *If you need a refill on your cardiac medications before your next appointment, please call your pharmacy*  Lab Work: Please complete a FASTING lipid panel and a CMET at any [facility] as soon as possible.   If you have labs (blood work) drawn today and your tests are completely normal, you will receive your results only by: MyChart Message (if you have MyChart) OR A paper copy in the mail If you have any lab test that is abnormal or we need to change your treatment, we will call you to review the results.  Testing/Procedures: Your physician has requested that you have an echocardiogram in January 2027. Echocardiography is a painless test that uses sound waves to create images of your heart. It provides your doctor with information about the size and shape of your heart and how well your heart's chambers and valves are working. This procedure takes approximately one hour. There are no restrictions for this procedure. Please do NOT wear cologne, perfume, aftershave, or lotions (deodorant is allowed). Please arrive 15 minutes prior to your appointment time.  Please note: We ask at that you not bring children with you during ultrasound (echo/ vascular) testing. Due to room size and safety concerns, children are not allowed in the ultrasound rooms during exams. Our front office staff cannot provide observation of children in our lobby area while testing is being conducted. An adult accompanying a patient to their appointment will only be allowed in the ultrasound room at the discretion of the ultrasound technician under special circumstances. We apologize for any inconvenience.  Dr. Shlomo has recommended that you have a coronary calcium score CT in January 2027. This is a test that can provide early detection of coronary artery  disease. Patient cost is $99.    Follow-Up: At [facility], you and your health needs are our priority.  As part of our continuing mission to provide you with exceptional heart care, our providers are all part of one team.  This team includes your primary Cardiologist (physician) and Advanced Practice Providers or APPs (Physician Assistants and Nurse Practitioners) who all work together to provide you with the care you need, when you need it.  Your next appointment:   1 year(s)  Provider:   Dr. Wilbert Shlomo, MD   We recommend signing up for the patient portal called MyChart.  Sign up information is provided on this After Visit Summary.  MyChart is used to connect with patients for Virtual Visits (Telemedicine).  Patients are able to view lab/test results, encounter notes, upcoming appointments, etc.  Non-urgent messages can be sent to your provider as well.   To learn more about what you can do with MyChart, go to [email or web address].   Other Instructions Please complete a blood pressure log. Check your blood pressure twice a day, once at lunch and once at dinnertime. Write down your readings and then call our office to give to our operators over the phone after 1 week. You can also submit over MyChart or drop off your readings at the front desk.

## 2023-07-16 NOTE — Addendum Note (Signed)
 Addended by: Jericho Alcorn L on: 07/16/2023 09:44 AM   Modules accepted: Orders

## 2023-07-18 DIAGNOSIS — K7689 Other specified diseases of liver: Secondary | ICD-10-CM | POA: Diagnosis not present

## 2023-07-18 DIAGNOSIS — K7400 Hepatic fibrosis, unspecified: Secondary | ICD-10-CM | POA: Diagnosis not present

## 2023-07-18 DIAGNOSIS — K7460 Unspecified cirrhosis of liver: Secondary | ICD-10-CM | POA: Diagnosis not present

## 2023-07-20 DIAGNOSIS — Z6826 Body mass index (BMI) 26.0-26.9, adult: Secondary | ICD-10-CM | POA: Diagnosis not present

## 2023-07-20 DIAGNOSIS — Z124 Encounter for screening for malignant neoplasm of cervix: Secondary | ICD-10-CM | POA: Diagnosis not present

## 2023-07-20 DIAGNOSIS — Z01419 Encounter for gynecological examination (general) (routine) without abnormal findings: Secondary | ICD-10-CM | POA: Diagnosis not present

## 2023-08-14 ENCOUNTER — Other Ambulatory Visit: Payer: Self-pay | Admitting: Obstetrics and Gynecology

## 2023-08-14 DIAGNOSIS — Z1231 Encounter for screening mammogram for malignant neoplasm of breast: Secondary | ICD-10-CM

## 2023-08-27 ENCOUNTER — Ambulatory Visit
Admission: RE | Admit: 2023-08-27 | Discharge: 2023-08-27 | Disposition: A | Discharge status: Home / Self Care | Source: Ambulatory Visit | Attending: Obstetrics and Gynecology | Admitting: Obstetrics and Gynecology

## 2023-08-27 DIAGNOSIS — Z1231 Encounter for screening mammogram for malignant neoplasm of breast: Secondary | ICD-10-CM

## 2023-10-08 DIAGNOSIS — C44519 Basal cell carcinoma of skin of other part of trunk: Secondary | ICD-10-CM | POA: Diagnosis not present

## 2023-10-25 ENCOUNTER — Encounter: Payer: Self-pay | Admitting: Internal Medicine

## 2023-10-26 DIAGNOSIS — D849 Immunodeficiency, unspecified: Secondary | ICD-10-CM | POA: Diagnosis not present

## 2023-10-26 DIAGNOSIS — K754 Autoimmune hepatitis: Secondary | ICD-10-CM | POA: Diagnosis not present

## 2023-10-26 DIAGNOSIS — K7689 Other specified diseases of liver: Secondary | ICD-10-CM | POA: Diagnosis not present

## 2023-11-14 DIAGNOSIS — R599 Enlarged lymph nodes, unspecified: Secondary | ICD-10-CM | POA: Diagnosis not present

## 2023-11-14 DIAGNOSIS — Z0000 Encounter for general adult medical examination without abnormal findings: Secondary | ICD-10-CM | POA: Diagnosis not present

## 2023-11-14 DIAGNOSIS — Z23 Encounter for immunization: Secondary | ICD-10-CM | POA: Diagnosis not present

## 2023-11-14 DIAGNOSIS — K7689 Other specified diseases of liver: Secondary | ICD-10-CM | POA: Diagnosis not present

## 2023-11-14 DIAGNOSIS — R748 Abnormal levels of other serum enzymes: Secondary | ICD-10-CM | POA: Diagnosis not present

## 2023-11-14 DIAGNOSIS — E890 Postprocedural hypothyroidism: Secondary | ICD-10-CM | POA: Diagnosis not present

## 2024-02-29 ENCOUNTER — Other Ambulatory Visit: Payer: Self-pay | Admitting: Surgery

## 2024-02-29 DIAGNOSIS — E041 Nontoxic single thyroid nodule: Secondary | ICD-10-CM

## 2024-03-03 ENCOUNTER — Ambulatory Visit (HOSPITAL_COMMUNITY)
